# Patient Record
Sex: Male | Born: 1995 | Race: White | Hispanic: No | Marital: Single | Smoking: Never smoker
Health system: Southern US, Community
[De-identification: ages and names within clinical notes are randomized; demographics above are authoritative.]

## PROBLEM LIST (undated history)

## (undated) DIAGNOSIS — F909 Attention-deficit hyperactivity disorder, unspecified type: Secondary | ICD-10-CM

## (undated) DIAGNOSIS — F840 Autistic disorder: Secondary | ICD-10-CM

## (undated) DIAGNOSIS — E119 Type 2 diabetes mellitus without complications: Secondary | ICD-10-CM

## (undated) DIAGNOSIS — F819 Developmental disorder of scholastic skills, unspecified: Secondary | ICD-10-CM

## (undated) DIAGNOSIS — K219 Gastro-esophageal reflux disease without esophagitis: Secondary | ICD-10-CM

## (undated) DIAGNOSIS — F70 Mild intellectual disabilities: Secondary | ICD-10-CM

## (undated) DIAGNOSIS — F419 Anxiety disorder, unspecified: Secondary | ICD-10-CM

## (undated) DIAGNOSIS — F988 Other specified behavioral and emotional disorders with onset usually occurring in childhood and adolescence: Secondary | ICD-10-CM

## (undated) HISTORY — DX: Other specified behavioral and emotional disorders with onset usually occurring in childhood and adolescence: F98.8

## (undated) HISTORY — DX: Developmental disorder of scholastic skills, unspecified: F81.9

---
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
## 2016-01-20 ENCOUNTER — Encounter: Payer: Self-pay | Admitting: Family Medicine

## 2016-01-20 ENCOUNTER — Ambulatory Visit (INDEPENDENT_AMBULATORY_CARE_PROVIDER_SITE_OTHER): Payer: Medicaid Other | Admitting: Family Medicine

## 2016-01-20 VITALS — BP 108/80 | Ht 64.0 in | Wt 213.0 lb

## 2016-01-20 DIAGNOSIS — L259 Unspecified contact dermatitis, unspecified cause: Secondary | ICD-10-CM | POA: Diagnosis not present

## 2016-01-20 DIAGNOSIS — F909 Attention-deficit hyperactivity disorder, unspecified type: Secondary | ICD-10-CM | POA: Diagnosis not present

## 2016-01-20 DIAGNOSIS — F988 Other specified behavioral and emotional disorders with onset usually occurring in childhood and adolescence: Secondary | ICD-10-CM

## 2016-01-20 MED ORDER — METHYLPHENIDATE HCL ER (CD) 50 MG PO CPCR: 50.0000 mg | ORAL_CAPSULE | ORAL | Status: DC

## 2016-01-20 MED ORDER — TRIAMCINOLONE ACETONIDE 0.1 % EX CREA: 1.0000 "application " | TOPICAL_CREAM | Freq: Two times a day (BID) | CUTANEOUS | Status: DC | PRN

## 2016-01-20 NOTE — Progress Notes (Signed)
   Subjective:    Patient ID: Elijah [name], male    DOB: 08/14/1995, 20 y.o.   MRN: [id number]  HPI Patient was seen today for ADD checkup. -weight, vital signs reviewed.  The following items were covered. -Compliance with medication: yes  -Problems with completing tasks: none  - Eating patterns : good  -sleeping: good  -Additional issues or questions: none   Review of Systems  Constitutional: Negative for activity change, appetite change and fatigue.  Gastrointestinal: Negative for abdominal pain.  Neurological: Negative for headaches.  Psychiatric/Behavioral: Negative for behavioral problems.       Objective:   Physical Exam  Constitutional: He appears well-developed and well-nourished. No distress.  HENT:  Head: Normocephalic.  Cardiovascular: Normal rate, regular rhythm and normal heart sounds.   No murmur heard. Pulmonary/Chest: Effort normal and breath sounds normal.  Neurological: He is alert.  Skin: Skin is warm and dry.  Psychiatric: He has a normal mood and affect. His behavior is normal.     Patient with contact dermatitis lower legs steroid cream recommended    patient will follow-up regarding ADD when he gets 41 and of his medicine does not use it all the times of therefore 3 prescriptions will last several months Assessment & Plan:  Patient continues have significant ADD issues into adulthood. He is with his best he can he uses medications on days in which he is working outside helping his family. Patient does have some cognitive difficulties which make it difficult for him to get a job currently patient is trying do the best he can he is driving well though.

## 2016-01-20 NOTE — Patient Instructions (Signed)

## 2016-03-25 ENCOUNTER — Telehealth: Payer: Self-pay | Admitting: Family Medicine

## 2016-03-25 NOTE — Telephone Encounter (Signed)
Tried to call no answer ( We received prior Auth for medication, prior auth submitted on 03/24/16 and awaiting approval)

## 2016-03-25 NOTE — Telephone Encounter (Signed)
Patient's pharmacy said they send a prior authorization for methylphenidate (METADATE CD) 50 MG CR capsule.  He is requesting for this to be completed today because he will be out of the medication this weekend.  [facility]

## 2016-03-31 ENCOUNTER — Telehealth: Payer: Self-pay | Admitting: Family Medicine

## 2016-03-31 NOTE — Telephone Encounter (Signed)
I would recommend Concerta 36 mg one every single day #30 hopefully Metadate will come back in stock then he can go back to his usual medicine

## 2016-03-31 NOTE — Telephone Encounter (Signed)
Pt is needing a different medication for his adhd due to the state being out of the metadate. Pt would be ok with being prescribed concerta or something close to what he is on. He is out of medication and is needing something. Please advise.

## 2016-04-01 ENCOUNTER — Other Ambulatory Visit: Payer: Self-pay | Admitting: *Deleted

## 2016-04-01 MED ORDER — METHYLPHENIDATE HCL ER (OSM) 36 MG PO TBCR: 36.0000 mg | EXTENDED_RELEASE_TABLET | Freq: Every day | ORAL | 0 refills | Status: DC

## 2016-04-01 NOTE — Telephone Encounter (Signed)
Pt.notified

## 2016-04-19 ENCOUNTER — Encounter: Payer: Medicaid Other | Admitting: Family Medicine

## 2016-04-20 ENCOUNTER — Ambulatory Visit (INDEPENDENT_AMBULATORY_CARE_PROVIDER_SITE_OTHER): Payer: Medicaid Other | Admitting: Family Medicine

## 2016-04-20 ENCOUNTER — Encounter: Payer: Self-pay | Admitting: Family Medicine

## 2016-04-20 VITALS — BP 126/84 | Ht 64.0 in | Wt 216.4 lb

## 2016-04-20 DIAGNOSIS — F909 Attention-deficit hyperactivity disorder, unspecified type: Secondary | ICD-10-CM

## 2016-04-20 DIAGNOSIS — F988 Other specified behavioral and emotional disorders with onset usually occurring in childhood and adolescence: Secondary | ICD-10-CM

## 2016-04-20 MED ORDER — RITALIN LA 40 MG PO CP24: 40.0000 mg | ORAL_CAPSULE | ORAL | 0 refills | Status: DC

## 2016-04-20 MED ORDER — METHYLPHENIDATE HCL 10 MG PO TABS: ORAL_TABLET | ORAL | 0 refills | Status: DC

## 2016-04-20 NOTE — Progress Notes (Signed)
   Subjective:    Patient ID: Elijah [name], male    DOB: 05/11/1996, 20 y.o.   MRN: [id number]  HPI Patient was seen today for ADD checkup. -weight, vital signs reviewed.  The following items were covered. -Compliance with medication : Takes daily.  -Problems with completing homework, paying attention/taking good notes in school: Patient has concerns concentrating on Concerta.   -grades: N/A  - Eating patterns : Patient states he eats well.   -sleeping: Patient states he sleeps well.  -Additional issues or questions: States no other concerns this visit.   Review of Systems  Constitutional: Negative for activity change, appetite change and fatigue.  Gastrointestinal: Negative for abdominal pain.  Neurological: Negative for headaches.  Psychiatric/Behavioral: Negative for behavioral problems.       Objective:   Physical Exam  Constitutional: He appears well-developed and well-nourished. No distress.  HENT:  Head: Normocephalic.  Cardiovascular: Normal rate, regular rhythm and normal heart sounds.   No murmur heard. Pulmonary/Chest: Effort normal and breath sounds normal.  Neurological: He is alert.  Skin: Skin is warm and dry.  Psychiatric: He has a normal mood and affect. His behavior is normal.          Assessment & Plan:  ADHD-this does impact his attention in his focus especially at work. He states he did better on Ritalin. He would like to have this prescribed. Therefore we will go with that. He needs to turn in his other dosage of his medication.  As for his 10 mg tablet he takes later in the afternoon only on days he is working should he uses this.  3 prescriptions of the long-acting was given 1 prescription of the short acting was given  Patient was encouraged to watch diet exercise and lose weight

## 2016-04-20 NOTE — Patient Instructions (Signed)
Please use the long-acting Ritalin LA one each morning for ADD. You have 3 prescriptions of this this will last you 90 days.  When necessary you may take one of the 10 mg short-acting pills at 3 PM to help with focus. You may not need this particular medicine every day. I have given you one prescription of this one you may need additional before your next visit if so please call us.  Please follow-up in 3 months time. Follow-up sooner if any problems or if you do not feel the medication is helping.

## 2016-05-11 ENCOUNTER — Ambulatory Visit (INDEPENDENT_AMBULATORY_CARE_PROVIDER_SITE_OTHER): Payer: Medicaid Other | Admitting: Family Medicine

## 2016-05-11 ENCOUNTER — Encounter: Payer: Self-pay | Admitting: Family Medicine

## 2016-05-11 VITALS — BP 122/80 | Temp 98.4°F | Ht 64.0 in | Wt 221.0 lb

## 2016-05-11 DIAGNOSIS — H6503 Acute serous otitis media, bilateral: Secondary | ICD-10-CM | POA: Diagnosis not present

## 2016-05-11 MED ORDER — AMOXICILLIN 500 MG PO CAPS: 500.0000 mg | ORAL_CAPSULE | Freq: Three times a day (TID) | ORAL | 0 refills | Status: DC

## 2016-05-11 NOTE — Progress Notes (Signed)
   Subjective:    Patient ID: CHRLES SELLEY, male    DOB: Apr 03, 1996, 20 y.o.   MRN: [id number]  Otalgia   There is pain in both ears. This is a new problem. The current episode started in the past 7 days. The problem has been unchanged. There has been no fever. The pain is moderate. Associated symptoms include headaches. He has tried nothing for the symptoms. The treatment provided no relief.   Patient has no other concerns at this time.   Ear pain both  And headache frontal in hanature  Using tylenol prn  No fever   Review of Systems  HENT: Positive for ear pain.   Neurological: Positive for headaches.       Objective:   Physical Exam  Alert vital stable hydration good bilateral otitis media evident. Moderate nasal congestion lungs clear heart regular in rhythm.      Assessment & Plan:  Impression post viral bilateral otitis media plan antibiotics prescribed. Symptom care discussed 1 signs discussed

## 2016-07-08 ENCOUNTER — Ambulatory Visit (INDEPENDENT_AMBULATORY_CARE_PROVIDER_SITE_OTHER): Payer: Medicaid Other | Admitting: Nurse Practitioner

## 2016-07-08 ENCOUNTER — Encounter: Payer: Self-pay | Admitting: Nurse Practitioner

## 2016-07-08 ENCOUNTER — Encounter: Payer: Self-pay | Admitting: Family Medicine

## 2016-07-08 VITALS — BP 120/86 | Temp 98.1°F | Ht 64.0 in | Wt 224.4 lb

## 2016-07-08 DIAGNOSIS — J300 Vasomotor rhinitis: Secondary | ICD-10-CM | POA: Diagnosis not present

## 2016-07-08 DIAGNOSIS — R197 Diarrhea, unspecified: Secondary | ICD-10-CM

## 2016-07-08 MED ORDER — BENZONATATE 100 MG PO CAPS: 100.0000 mg | ORAL_CAPSULE | Freq: Three times a day (TID) | ORAL | 0 refills | Status: DC | PRN

## 2016-07-09 ENCOUNTER — Encounter: Payer: Self-pay | Admitting: Nurse Practitioner

## 2016-07-09 NOTE — Progress Notes (Signed)
Subjective:  Presents for c/o frequent diarrhea that began yesterday afternoon. Abdominal pain slightly better today. No fever. Slight sore throat. Mild headache. Nausea, no vomiting. Spells of coughing. Ear pain. No wheezing. Taking allergy pills and flonase. Taking fluids well. Voiding nl.   Objective:   BP 120/86   Temp 98.1 F (36.7 C) (Oral)   Ht 5\' 4"  (1.626 m)   Wt 224 lb 6 oz (101.8 kg)   BMI 38.51 kg/m  NAD. Alert, oriented. TMs clear effusion. Pharynx clear. Neck supple with mild anterior adenopathy. Lungs clear. Heart RRR. Abdomen soft with active BS; minimal tenderness. No rebound or guarding. No obvious masses.   Assessment: Diarrhea, unspecified type  Acute vasomotor rhinitis  Plan:  Meds ordered this encounter  Medications  . benzonatate (TESSALON) 100 MG capsule    Sig: Take 1 capsule (100 mg total) by mouth 3 (three) times daily as needed for cough.    Dispense:  30 capsule    Refill:  0    Order Specific Question:   Supervising Provider    Answer:   Merlyn AlbertLUKING, WILLIAM S [[id number]]    Continue sinus meds. Discussed measures to help diarrhea. Call back in 72 hours if no improvement, sooner if worse.

## 2016-07-12 ENCOUNTER — Ambulatory Visit: Payer: Medicaid Other | Admitting: Family Medicine

## 2016-07-27 ENCOUNTER — Emergency Department (HOSPITAL_COMMUNITY): Payer: Medicaid Other

## 2016-07-27 ENCOUNTER — Emergency Department (HOSPITAL_COMMUNITY)
Admission: EM | Admit: 2016-07-27 | Discharge: 2016-07-27 | Disposition: A | Discharge status: Home / Self Care | Payer: Medicaid Other | Attending: Emergency Medicine | Admitting: Emergency Medicine

## 2016-07-27 ENCOUNTER — Encounter (HOSPITAL_COMMUNITY): Payer: Self-pay | Admitting: Emergency Medicine

## 2016-07-27 DIAGNOSIS — F909 Attention-deficit hyperactivity disorder, unspecified type: Secondary | ICD-10-CM | POA: Diagnosis not present

## 2016-07-27 DIAGNOSIS — Y990 Civilian activity done for income or pay: Secondary | ICD-10-CM | POA: Insufficient documentation

## 2016-07-27 DIAGNOSIS — Y9389 Activity, other specified: Secondary | ICD-10-CM | POA: Insufficient documentation

## 2016-07-27 DIAGNOSIS — S46912A Strain of unspecified muscle, fascia and tendon at shoulder and upper arm level, left arm, initial encounter: Secondary | ICD-10-CM | POA: Diagnosis not present

## 2016-07-27 DIAGNOSIS — Y9269 Other specified industrial and construction area as the place of occurrence of the external cause: Secondary | ICD-10-CM | POA: Insufficient documentation

## 2016-07-27 DIAGNOSIS — X500XXA Overexertion from strenuous movement or load, initial encounter: Secondary | ICD-10-CM | POA: Insufficient documentation

## 2016-07-27 DIAGNOSIS — Z79899 Other long term (current) drug therapy: Secondary | ICD-10-CM | POA: Diagnosis not present

## 2016-07-27 DIAGNOSIS — S4992XA Unspecified injury of left shoulder and upper arm, initial encounter: Secondary | ICD-10-CM | POA: Diagnosis present

## 2016-07-27 MED ORDER — CYCLOBENZAPRINE HCL 10 MG PO TABS: 10.0000 mg | ORAL_TABLET | Freq: Two times a day (BID) | ORAL | 0 refills | Status: DC | PRN

## 2016-07-27 MED ORDER — IBUPROFEN 400 MG PO TABS: 400.0000 mg | ORAL_TABLET | Freq: Four times a day (QID) | ORAL | 0 refills | Status: DC | PRN

## 2016-07-27 NOTE — ED Triage Notes (Signed)
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 PT states some relief from muscle ache cream applied at home recently.

## 2016-07-27 NOTE — ED Notes (Signed)
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 Pain eases with rest and muscle creams. Denies any fall or injury to area.

## 2016-07-27 NOTE — ED Provider Notes (Signed)
[facility] Provider Note   CSN: [id number] Arrival date & time: 07/27/16  1132     History   Chief Complaint Chief Complaint  Patient presents with  . Arm Pain    HPI Elijah [name] is a 21 y.o. male.  HPI   21 year old male presents with complaint of left arm pain. Patient report recurrent seen to his left shoulder ongoing for more than a week. He described pain as a burning sharp shooting pain down his arm worsening with bending of his elbow and lifting his arm. As is worse, pain is 10 out of 10, pain is currently 5 out of 10. Some improvement with using BenGay muscle cream. He denies any associated fever, neck pain, chest pain, numbness, or dropping objects. He is right-hand dominant. He has been working at a new job doing some heavy lifting for the past 3 weeks. He denies any specific injury.  Past Medical History:  Diagnosis Date  . ADD (attention deficit disorder)   . ADHD (attention deficit hyperactivity disorder)   . Learning disability     Patient Active Problem List   Diagnosis Date Noted  . Learning disability 12/21/2014  . ADD (attention deficit disorder) 11/24/2012  . Morbid obesity (HCC) 11/24/2012    History reviewed. No pertinent surgical history.     Home Medications    Prior to Admission medications   Medication Sig Start Date End Date Taking? Authorizing Provider  benzonatate (TESSALON) 100 MG capsule Take 1 capsule (100 mg total) by mouth 3 (three) times daily as needed for cough. 07/08/16   Campbell Richesarolyn C Hoskins, NP  fluticasone (FLONASE) 50 MCG/ACT nasal spray Place 2 sprays into both nostrils daily. Prn head congestion 03/16/15   Campbell Richesarolyn C Hoskins, NP  loratadine (CLARITIN) 10 MG tablet Take 1 tablet (10 mg total) by mouth daily. 12/01/14   Babs SciaraScott A Luking, MD  methylphenidate (RITALIN) 10 MG tablet 1 q3pm when needed for ADD 04/20/16   Babs SciaraScott A Luking, MD  RITALIN LA 40 MG 24 hr capsule Take 1 capsule (40 mg total) by mouth every morning.  04/20/16   Babs SciaraScott A Luking, MD    Family History History reviewed. No pertinent family history.  Social History Social History  Substance Use Topics  . Smoking status: Never Smoker  . Smokeless tobacco: Never Used  . Alcohol use No     Allergies   Patient has no known allergies.   Review of Systems Review of Systems  Constitutional: Negative for fever.  Respiratory: Negative for chest tightness and shortness of breath.   Cardiovascular: Negative for chest pain.  Musculoskeletal: Positive for arthralgias.  Skin: Negative for rash and wound.     Physical Exam Updated Vital Signs BP 131/81 (BP Location: Right Arm)   Pulse 101   Temp 98.1 F (36.7 C) (Oral)   Resp 18   Ht 5\' 4"  (1.626 m)   Wt 101.6 kg   SpO2 97%   BMI 38.45 kg/m   Physical Exam  Constitutional: He appears well-developed and well-nourished. No distress.  HENT:  Head: Atraumatic.  Eyes: Conjunctivae are normal.  Neck: Neck supple.  Musculoskeletal: He exhibits tenderness (Left shoulder: Tenderness to lateral deltoid at the bicipital groove of the bicep on palpation. Decreased shoulder flexion and rotation secondary to pain but no crepitus or deformity. No overlying skin changes.).  No cervical midline spine tenderness crepitus or step-off. Left elbow with normal flexion and extension, radial pulse 2+, normal grip strength  Neurological: He is  alert.  Skin: No rash noted.  Psychiatric: He has a normal mood and affect.  Nursing note and vitals reviewed.    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 History of morbid obesity. EXAM: LEFT SHOULDER - 2+ VIEW COMPARISON:  Chest x-ray which included the right  shoulder dated Dec 16, 2007 FINDINGS: The bones are subjectively adequately mineralized. There is no acute fracture nor dislocation. The joint spaces are well maintained. There is no lytic or blastic bony lesion. The observed portions of the left clavicle and upper left ribs are normal. IMPRESSION: There is no acute or significant chronic bony abnormality of the left shoulder. Electronically Signed   By: David  [place] M.D.   On: 07/27/2016 12:26    Procedures Procedures (including critical care time)  Medications Ordered in ED Medications - No data to display   Initial Impression / Assessment and Plan / ED Course  I have reviewed the triage vital signs and the nursing notes.  Pertinent labs & imaging results that were available during my care of the patient were reviewed by me and considered in my medical decision making (see chart for details).  Clinical Course     BP 131/81 (BP Location: Right Arm)   Pulse 101   Temp 98.1 F (36.7 C) (Oral)   Resp 18   Ht 5\' 4"  (1.626 m)   Wt 101.6 kg   SpO2 97%   BMI 38.45 kg/m    Final Clinical Impressions(s) / ED Diagnoses   Final diagnoses:  Left shoulder strain, initial encounter    New Prescriptions New Prescriptions   CYCLOBENZAPRINE (FLEXERIL) 10 MG TABLET    Take 1 tablet (10 mg total) by mouth 2 (two) times daily as needed for muscle spasms.   IBUPROFEN (ADVIL,MOTRIN) 400 MG TABLET    Take 1 tablet (400 mg total) by mouth every 6 (six) hours as needed.   12:10 PM L shoulder pain from heavy lifting.  Xray to r/o fx.  12:34 PM Xray neg.  Reassurance given.  RICE therapy discussed.  Ortho referral as needed.    Fayrene Helper, PA-C 07/27/16 1234    Donnetta Hutching, MD 07/30/16 [phone]

## 2016-08-02 ENCOUNTER — Encounter (HOSPITAL_COMMUNITY): Payer: Self-pay

## 2016-08-02 ENCOUNTER — Emergency Department (HOSPITAL_COMMUNITY)
Admission: EM | Admit: 2016-08-02 | Discharge: 2016-08-02 | Disposition: A | Discharge status: Home / Self Care | Payer: Medicaid Other | Attending: Emergency Medicine | Admitting: Emergency Medicine

## 2016-08-02 DIAGNOSIS — S4992XD Unspecified injury of left shoulder and upper arm, subsequent encounter: Secondary | ICD-10-CM | POA: Diagnosis present

## 2016-08-02 DIAGNOSIS — F909 Attention-deficit hyperactivity disorder, unspecified type: Secondary | ICD-10-CM | POA: Diagnosis not present

## 2016-08-02 DIAGNOSIS — S46912D Strain of unspecified muscle, fascia and tendon at shoulder and upper arm level, left arm, subsequent encounter: Secondary | ICD-10-CM

## 2016-08-02 DIAGNOSIS — S46812D Strain of other muscles, fascia and tendons at shoulder and upper arm level, left arm, subsequent encounter: Secondary | ICD-10-CM | POA: Diagnosis not present

## 2016-08-02 DIAGNOSIS — X500XXD Overexertion from strenuous movement or load, subsequent encounter: Secondary | ICD-10-CM | POA: Insufficient documentation

## 2016-08-02 MED ORDER — IBUPROFEN 600 MG PO TABS: 600.0000 mg | ORAL_TABLET | Freq: Four times a day (QID) | ORAL | 0 refills | Status: DC | PRN

## 2016-08-02 MED ORDER — METHOCARBAMOL 500 MG PO TABS: 500.0000 mg | ORAL_TABLET | Freq: Three times a day (TID) | ORAL | 0 refills | Status: DC

## 2016-08-02 NOTE — ED Provider Notes (Signed)
[facility] Provider Note   CSN: [id number] Arrival date & time: 08/02/16  2000     History   Chief Complaint Chief Complaint  Patient presents with  . Shoulder Pain    HPI EAN GETTEL is a 21 y.o. male.  HPI   Elijah [name] is a 21 y.o. male who presents to the Emergency Department complaining of Persistent left shoulder pain. He was seen here on 07/27/2016 for the same. He states the pain became worse 3 days ago after a fall against a wall. He reports pain with raising his arm and with rotation. He describes it as a burning sharp pain. He also states that he does heavy lifting and reaching at his job which he believes may have also contributed to his pain.  He was treated with Flexeril and ibuprofen previously, states the Flexeril makes him too sleepy and he is ran out of ibuprofen. He comes here this evening requesting a work note. He denies headaches, fever, swelling, numbness or weakness of the upper extremity.    Past Medical History:  Diagnosis Date  . ADD (attention deficit disorder)   . ADHD (attention deficit hyperactivity disorder)   . Learning disability     Patient Active Problem List   Diagnosis Date Noted  . Learning disability 12/21/2014  . ADD (attention deficit disorder) 11/24/2012  . Morbid obesity (HCC) 11/24/2012    History reviewed. No pertinent surgical history.     Home Medications    Prior to Admission medications   Medication Sig Start Date End Date Taking? Authorizing Provider  benzonatate (TESSALON) 100 MG capsule Take 1 capsule (100 mg total) by mouth 3 (three) times daily as needed for cough. 07/08/16   Campbell Riches, NP  cyclobenzaprine (FLEXERIL) 10 MG tablet Take 1 tablet (10 mg total) by mouth 2 (two) times daily as needed for muscle spasms. 07/27/16   Fayrene Helper, PA-C  fluticasone (FLONASE) 50 MCG/ACT nasal spray Place 2 sprays into both nostrils daily. Prn head congestion 03/16/15   Campbell Riches, NP    ibuprofen (ADVIL,MOTRIN) 400 MG tablet Take 1 tablet (400 mg total) by mouth every 6 (six) hours as needed. 07/27/16   Fayrene Helper, PA-C  loratadine (CLARITIN) 10 MG tablet Take 1 tablet (10 mg total) by mouth daily. 12/01/14   Babs Sciara, MD  methylphenidate (RITALIN) 10 MG tablet 1 q3pm when needed for ADD 04/20/16   Babs Sciara, MD  RITALIN LA 40 MG 24 hr capsule Take 1 capsule (40 mg total) by mouth every morning. 04/20/16   Babs Sciara, MD    Family History No family history on file.  Social History Social History  Substance Use Topics  . Smoking status: Never Smoker  . Smokeless tobacco: Never Used  . Alcohol use No     Allergies   Patient has no known allergies.   Review of Systems Review of Systems  Constitutional: Negative for chills and fever.  Respiratory: Negative for shortness of breath.   Cardiovascular: Negative for chest pain.  Gastrointestinal: Negative for abdominal pain, nausea and vomiting.  Genitourinary: Negative for difficulty urinating and dysuria.  Musculoskeletal: Positive for arthralgias (Left shoulder pain). Negative for back pain, joint swelling, neck pain and neck stiffness.  Skin: Negative for color change and rash.  Neurological: Negative for dizziness, weakness and numbness.  All other systems reviewed and are negative.    Physical Exam Updated Vital Signs BP 131/73 (BP Location: Right Arm)  Pulse 93   Temp 98.3 F (36.8 C) (Oral)   Resp 20   Ht 5\' 4"  (1.626 m)   Wt 99.3 kg   SpO2 97%   BMI 37.59 kg/m   Physical Exam  Constitutional: He is oriented to person, place, and time. He appears well-developed and well-nourished. No distress.  HENT:  Head: Normocephalic and atraumatic.  Neck: Normal range of motion. Neck supple. No thyromegaly present.  Cardiovascular: Normal rate, regular rhythm and intact distal pulses.   Pulmonary/Chest: Effort normal and breath sounds normal. No respiratory distress. He exhibits no tenderness.   Musculoskeletal: Normal range of motion. He exhibits tenderness. He exhibits no edema or deformity.  ttp of the posterior left shoulder and lateral deltoid muscle.  Pain reproduced with abduction of the left arm and rotation of the shoulder.  Radial pulse is brisk, distal sensation intact, CR< 2 sec. Grip strength is strong and symmetrical.   No edema , erythema or step-off deformity of the joint.   Lymphadenopathy:    He has no cervical adenopathy.  Neurological: He is alert and oriented to person, place, and time. He has normal strength. No sensory deficit. He exhibits normal muscle tone. Coordination normal.  Skin: Skin is warm and dry.  Nursing note and vitals reviewed.    ED Treatments / Results  Labs (all labs ordered are listed, but only abnormal results are displayed) Labs Reviewed - No data to display  EKG  EKG Interpretation None       Radiology No results found.  Procedures Procedures (including critical care time)  Medications Ordered in ED Medications - No data to display   Initial Impression / Assessment and Plan / ED Course  I have reviewed the triage vital signs and the nursing notes.  Pertinent labs & imaging results that were available during my care of the patient were reviewed by me and considered in my medical decision making (see chart for details).  Clinical Course     Patient here 6 days ago for same symptoms. Reports worsening pain after a blunt injury in which he fell against a wall. Patient has full range of motion on exam. Remains neurovascularly intact. Given reported mechanism of injury, I doubt fx.     X-ray from previous visit was reviewed by me. Exam findings consistent with sprain. Patient requesting a work note. He states he has appointment with his PCP in 3 days. He agrees to ice, and will refill ibuprofen and Robaxin  Final Clinical Impressions(s) / ED Diagnoses   Final diagnoses:  Strain of left shoulder, subsequent encounter     New Prescriptions New Prescriptions   No medications on file     Rosey Bathammy Banks Chaikin, PA-C 08/02/16 2054    Vanetta MuldersScott Zackowski, MD 08/02/16 2129

## 2016-08-02 NOTE — Discharge Instructions (Signed)
Apply ice packs on/off to your shoulder.  Stop the flexeril.  Follow-up with Dr. Gerda DissLuking this week

## 2016-08-02 NOTE — ED Triage Notes (Signed)
I have been seen for left shoulder pain and they said I had a muscle strain.  The pain has became worse since I slipped and fell up against a wall hurting it again.

## 2016-08-05 ENCOUNTER — Encounter: Payer: Self-pay | Admitting: Nurse Practitioner

## 2016-08-05 ENCOUNTER — Ambulatory Visit (INDEPENDENT_AMBULATORY_CARE_PROVIDER_SITE_OTHER): Payer: Medicaid Other | Admitting: Nurse Practitioner

## 2016-08-05 VITALS — BP 118/82 | Ht 64.0 in | Wt 218.6 lb

## 2016-08-05 DIAGNOSIS — F988 Other specified behavioral and emotional disorders with onset usually occurring in childhood and adolescence: Secondary | ICD-10-CM | POA: Diagnosis not present

## 2016-08-05 DIAGNOSIS — S46912D Strain of unspecified muscle, fascia and tendon at shoulder and upper arm level, left arm, subsequent encounter: Secondary | ICD-10-CM

## 2016-08-05 MED ORDER — RITALIN LA 40 MG PO CP24: 40.0000 mg | ORAL_CAPSULE | ORAL | 0 refills | Status: DC

## 2016-08-05 MED ORDER — FLUTICASONE PROPIONATE 50 MCG/ACT NA SUSP: 2.0000 | Freq: Every day | NASAL | 5 refills | Status: DC

## 2016-08-05 NOTE — Progress Notes (Signed)
Subjective: Presents for recheck on ADD/ADHD today.  Compliant with medication: yes; does not take second dose  Difficulty completing tasks or focusing: none  Side effects: none  Appetite: normal   Sleep: 3rd shift; trouble sleeping related to this  Additional issues or questions: also wants to recheck left shoulder; had a shoulder strain; seen at ED on 1/3 and 1/9. Has been off work for a few days; getting better  Objective: BP 118/82   Ht 5\' 4"  (1.626 m)   Wt 218 lb 9.6 oz (99.2 kg)   BMI 37.52 kg/m  NAD. Alert, oriented. Lungs clear. Heart RRR. Normal ROM of left shoulder with minimal tenderness. Hand and arm strength 5 + bilat.   Assessment:  Problem List Items Addressed This Visit      Other   ADD (attention deficit disorder) - Primary    Other Visit Diagnoses    Strain of left shoulder, subsequent encounter           Plan:  Meds ordered this encounter  Medications  . fluticasone (FLONASE) 50 MCG/ACT nasal spray    Sig: Place 2 sprays into both nostrils daily. Prn head congestion    Dispense:  16 g    Refill:  5    Order Specific Question:   Supervising Provider    Answer:   Merlyn AlbertLUKING, WILLIAM S [[id number]]  . DISCONTD: RITALIN LA 40 MG 24 hr capsule    Sig: Take 1 capsule (40 mg total) by mouth every morning.    Dispense:  30 capsule    Refill:  0    Dispense as written-brand name only per Medicaid guidelines    Order Specific Question:   Supervising Provider    Answer:   Merlyn AlbertLUKING, WILLIAM S [[id number]]  . DISCONTD: RITALIN LA 40 MG 24 hr capsule    Sig: Take 1 capsule (40 mg total) by mouth every morning. May fill 30 days from 08/05/16    Dispense:  30 capsule    Refill:  0    Dispense as written-brand name only per Medicaid guidelines    Order Specific Question:   Supervising Provider    Answer:   Merlyn AlbertLUKING, WILLIAM S [[id number]]  . DISCONTD: RITALIN LA 40 MG 24 hr capsule    Sig: Take 1 capsule (40 mg total) by mouth every morning.    Dispense:  30 capsule    Refill:  0    Dispense as written-brand name only per Medicaid guidelines; may refill 60 days from 08/05/16    Order Specific Question:   Supervising Provider    Answer:   Merlyn AlbertLUKING, WILLIAM S [[id number]]  . RITALIN LA 40 MG 24 hr capsule    Sig: Take 1 capsule (40 mg total) by mouth every morning.    Dispense:  30 capsule    Refill:  0    Dispense as written-brand name only per Medicaid guidelines; may refill 90 days from 08/05/16   Given 3 Rx by NP and fourth by MD. Given copy of shoulder exercises. Return to work on Sunday as planned. Call back 1/15 if unable to work. Return in about 4 months (around 12/03/2016).

## 2016-08-06 ENCOUNTER — Encounter: Payer: Self-pay | Admitting: Nurse Practitioner

## 2016-08-08 ENCOUNTER — Encounter: Payer: Self-pay | Admitting: Nurse Practitioner

## 2016-08-08 ENCOUNTER — Other Ambulatory Visit: Payer: Self-pay | Admitting: Nurse Practitioner

## 2016-08-08 DIAGNOSIS — S4992XA Unspecified injury of left shoulder and upper arm, initial encounter: Secondary | ICD-10-CM

## 2016-08-16 ENCOUNTER — Ambulatory Visit (INDEPENDENT_AMBULATORY_CARE_PROVIDER_SITE_OTHER): Payer: Medicaid Other | Admitting: Family Medicine

## 2016-08-16 ENCOUNTER — Encounter: Payer: Self-pay | Admitting: Family Medicine

## 2016-08-16 VITALS — Temp 98.9°F | Ht 64.0 in | Wt 215.0 lb

## 2016-08-16 DIAGNOSIS — R6889 Other general symptoms and signs: Secondary | ICD-10-CM

## 2016-08-16 DIAGNOSIS — J029 Acute pharyngitis, unspecified: Secondary | ICD-10-CM | POA: Diagnosis not present

## 2016-08-16 MED ORDER — AZITHROMYCIN 250 MG PO TABS: ORAL_TABLET | ORAL | 0 refills | Status: DC

## 2016-08-16 NOTE — Patient Instructions (Addendum)
Clinically has strep throat/ has tonsillitis Will treat with Zpack No work for Tuesday thru Thursday Hopefully can return to work on Friday night  If you need a note for Friday please call  Call us if worse

## 2016-08-16 NOTE — Progress Notes (Signed)
   Subjective:    Patient ID: Elijah [name], male    DOB: 11/14/1995, 21 y.o.   MRN: [id number]  Cough  This is a new problem. The current episode started in the past 7 days. Associated symptoms include ear pain, a fever, nasal congestion, a sore throat and wheezing.   This patient does relate some head congestion and drainage but he also been no wheezing was heard on physical exam. Patient does relate a progressive sore throat hurts to swallow. His illness started on Friday and progressively got worse over the weekend.relates a severe sore throat is been present on past few days initially he stated that he was wheezing   Review of Systems  Constitutional: Positive for fever.  HENT: Positive for ear pain and sore throat.   Respiratory: Positive for cough and wheezing.    makes good eye contact     Objective: makes good eye contact   Physical Exam    patient not toxic lungs are clear hearts regular patient with exudative pharyngitis worse on the left than the right airway not compromised although he does have some symptoms of the flu is exudative pharyngitis and sore throat are impressive I would recommend go ahead and covering this with an antibiotic to treat for the possibility of strep patient does need to follow-up if progressive troubles or if worse    Assessment & Plan:  See above

## 2016-08-23 ENCOUNTER — Encounter: Payer: Self-pay | Admitting: Family Medicine

## 2016-10-06 ENCOUNTER — Telehealth: Payer: Self-pay | Admitting: Nurse Practitioner

## 2016-10-06 ENCOUNTER — Encounter (HOSPITAL_COMMUNITY): Payer: Self-pay

## 2016-10-06 ENCOUNTER — Emergency Department (HOSPITAL_COMMUNITY)
Admission: EM | Admit: 2016-10-06 | Discharge: 2016-10-07 | Disposition: A | Discharge status: Home / Self Care | Payer: Medicaid Other | Attending: Emergency Medicine | Admitting: Emergency Medicine

## 2016-10-06 DIAGNOSIS — F918 Other conduct disorders: Secondary | ICD-10-CM | POA: Insufficient documentation

## 2016-10-06 DIAGNOSIS — F909 Attention-deficit hyperactivity disorder, unspecified type: Secondary | ICD-10-CM | POA: Diagnosis not present

## 2016-10-06 DIAGNOSIS — R4689 Other symptoms and signs involving appearance and behavior: Secondary | ICD-10-CM

## 2016-10-06 DIAGNOSIS — R440 Auditory hallucinations: Secondary | ICD-10-CM | POA: Diagnosis not present

## 2016-10-06 LAB — URINALYSIS, ROUTINE W REFLEX MICROSCOPIC
Bilirubin Urine: NEGATIVE
GLUCOSE, UA: NEGATIVE mg/dL
HGB URINE DIPSTICK: NEGATIVE
Ketones, ur: NEGATIVE mg/dL
Leukocytes, UA: NEGATIVE
Nitrite: NEGATIVE
PROTEIN: NEGATIVE mg/dL
Specific Gravity, Urine: 1.019 (ref 1.005–1.030)
pH: 6 (ref 5.0–8.0)

## 2016-10-06 LAB — CBC WITH DIFFERENTIAL/PLATELET
Basophils Absolute: 0 10*3/uL (ref 0.0–0.1)
Basophils Relative: 0 %
EOS ABS: 0.3 10*3/uL (ref 0.0–0.7)
EOS PCT: 3 %
HCT: 44.6 % (ref 39.0–52.0)
Hemoglobin: 14.6 g/dL (ref 13.0–17.0)
LYMPHS ABS: 3.9 10*3/uL (ref 0.7–4.0)
Lymphocytes Relative: 39 %
MCH: 27.8 pg (ref 26.0–34.0)
MCHC: 32.7 g/dL (ref 30.0–36.0)
MCV: 84.8 fL (ref 78.0–100.0)
MONO ABS: 1.1 10*3/uL — AB (ref 0.1–1.0)
Monocytes Relative: 11 %
NEUTROS PCT: 47 %
Neutro Abs: 4.6 10*3/uL (ref 1.7–7.7)
PLATELETS: 274 10*3/uL (ref 150–400)
RBC: 5.26 MIL/uL (ref 4.22–5.81)
RDW: 13.4 % (ref 11.5–15.5)
WBC: 9.9 10*3/uL (ref 4.0–10.5)

## 2016-10-06 LAB — BASIC METABOLIC PANEL
ANION GAP: 12 (ref 5–15)
BUN: 9 mg/dL (ref 6–20)
CHLORIDE: 101 mmol/L (ref 101–111)
CO2: 26 mmol/L (ref 22–32)
Calcium: 9.9 mg/dL (ref 8.9–10.3)
Creatinine, Ser: 0.63 mg/dL (ref 0.61–1.24)
Glucose, Bld: 88 mg/dL (ref 65–99)
POTASSIUM: 4.2 mmol/L (ref 3.5–5.1)
SODIUM: 139 mmol/L (ref 135–145)

## 2016-10-06 LAB — ACETAMINOPHEN LEVEL: Acetaminophen (Tylenol), Serum: <10 ug/mL — ABNORMAL LOW (ref 10–30)

## 2016-10-06 LAB — ETHANOL

## 2016-10-06 LAB — RAPID URINE DRUG SCREEN, HOSP PERFORMED
Amphetamines: NOT DETECTED
Barbiturates: NOT DETECTED
Benzodiazepines: NOT DETECTED
Cocaine: NOT DETECTED
OPIATES: NOT DETECTED
TETRAHYDROCANNABINOL: NOT DETECTED

## 2016-10-06 LAB — SALICYLATE LEVEL: Salicylate Lvl: <7 mg/dL (ref 2.8–30.0)

## 2016-10-06 MED ORDER — ACETAMINOPHEN 500 MG PO TABS
1000.0000 mg | ORAL_TABLET | Freq: Once | ORAL | Status: AC
  Administered 2016-10-06: 1000 mg via ORAL
  Filled 2016-10-06: qty 2

## 2016-10-06 MED ORDER — IBUPROFEN 200 MG PO TABS: 600.0000 mg | ORAL_TABLET | Freq: Four times a day (QID) | ORAL | Status: DC | PRN

## 2016-10-06 MED ORDER — FLUTICASONE PROPIONATE 50 MCG/ACT NA SUSP
2.0000 | Freq: Every day | NASAL | Status: DC | PRN
  Administered 2016-10-07: 2 via NASAL
  Filled 2016-10-06: qty 16

## 2016-10-06 NOTE — ED Provider Notes (Signed)
Emergency Department Provider Note   I have reviewed the triage vital signs and the nursing notes.   HISTORY  Chief Complaint Psychiatric Evaluation   HPI Elijah [name] is a 21 y.o. male with PMH of autism and ADD presents to the emergency room in for evaluation of increased aggression, auditory hallucinations, and mania symptoms over the last 2 weeks. The patient stopped taking his ritalin as prescribed for unknown reason. Mom states that he recently moved in with his grandparents and started a part-time job. She notes that he has been aggressive toward family members including his sister and cousin. He reports hearing sounds of animals and sometimes voices which is very unusual for him. Mom states he has not been sleeping, spending lots of money, and generally irritable. He has had inpatient stays in psychiatric facilities in the past but nothing recently. Denies SI or HI but has been making statements such as "I'm done with my life."    Past Medical History:  Diagnosis Date  . ADD (attention deficit disorder)   . ADHD (attention deficit hyperactivity disorder)   . Learning disability     Patient Active Problem List   Diagnosis Date Noted  . Aggressive behavior   . Auditory hallucinations   . Learning disability 12/21/2014  . ADD (attention deficit disorder) 11/24/2012  . Morbid obesity (HCC) 11/24/2012    History reviewed. No pertinent surgical history.  Current Outpatient Rx  . Order #: [id number] Class: Normal  . Order #: [id number] Class: Print    Allergies Patient has no known allergies.  No family history on file.  Social History Social History  Substance Use Topics  . Smoking status: Never Smoker  . Smokeless tobacco: Never Used  . Alcohol use No    Review of Systems  Constitutional: No fever/chills Eyes: No visual changes. ENT: No sore throat. Cardiovascular: Denies chest pain. Respiratory: Denies shortness of breath. Gastrointestinal: No  abdominal pain.  No nausea, no vomiting.  No diarrhea.  No constipation. Genitourinary: Negative for dysuria. Musculoskeletal: Negative for back pain. Skin: Negative for rash. Neurological: Negative for headaches, focal weakness or numbness. Psychiatric: Aggression and auditory hallucinations.   10-point ROS otherwise negative.  ____________________________________________   PHYSICAL EXAM:  VITAL SIGNS: ED Triage Vitals [10/06/16 1851]  Enc Vitals Group     BP (!) 143/81     Pulse Rate 94     Resp 20     Temp 97.9 F (36.6 C)     Temp Source Oral     SpO2 96 %     Weight 221 lb 2 oz (100.3 kg)     Height 5\' 6"  (1.676 m)    Constitutional: Alert and oriented. Well appearing and in no acute distress. Eyes: Conjunctivae are normal.  Head: Atraumatic. Nose: No congestion/rhinnorhea. Mouth/Throat: Mucous membranes are moist.  Oropharynx non-erythematous. Neck: No stridor.   Cardiovascular: Normal rate, regular rhythm. Good peripheral circulation. Grossly normal heart sounds.   Respiratory: Normal respiratory effort.  No retractions. Lungs CTAB. Gastrointestinal: Soft and nontender. No distention.  Musculoskeletal: No lower extremity tenderness nor edema. No gross deformities of extremities. Neurologic:  Normal speech and language. No gross focal neurologic deficits are appreciated.  Skin:  Skin is warm, dry and intact. No rash noted. Psychiatric: Mood and affect are normal. Speech and behavior are normal. ____________________________________________   LABS (all labs ordered are listed, but only abnormal results are displayed)  Labs Reviewed  ACETAMINOPHEN LEVEL - Abnormal; Notable for the following:  Result Value   Acetaminophen (Tylenol), Serum <10 (*)    All other components within normal limits  CBC WITH DIFFERENTIAL/PLATELET - Abnormal; Notable for the following:    Monocytes Absolute 1.1 (*)    All other components within normal limits  BASIC METABOLIC PANEL   ETHANOL  SALICYLATE LEVEL  URINALYSIS, ROUTINE W REFLEX MICROSCOPIC  RAPID URINE DRUG SCREEN, HOSP PERFORMED   ____________________________________________   PROCEDURES  Procedure(s) performed:   Procedures  None ____________________________________________   INITIAL IMPRESSION / ASSESSMENT AND PLAN / ED COURSE  Pertinent labs & imaging results that were available during my care of the patient were reviewed by me and considered in my medical decision making (see chart for details).  Patient resents to the emergency room in for evaluation of increased aggression toward family, auditory hallucinations, borderline manic behavior. No diagnoses of bipolar disorder. The patient has been off of his Ritalin for the last 2 weeks when symptoms began. Denies active suicidal or homicidal ideation. He has been making passive statements about being done with his life. Believe this patient would benefit from psychiatric evaluation and possible placement. No IVC at this time. Mother is available by phone and her phone numbers updated in Epic. Plan for medical clearance and psychiatry evaluation.  11:34 PM Labs reviewed. The patient is voluntary and medically clear for evaluation. Will place consult and PRN medication.  ____________________________________________  FINAL CLINICAL IMPRESSION(S) / ED DIAGNOSES  Final diagnoses:  Aggressive behavior  Auditory hallucinations     MEDICATIONS GIVEN DURING THIS VISIT:  Medications  fluticasone (FLONASE) 50 MCG/ACT nasal spray 2 spray (2 sprays Each Nare Given 10/07/16 0130)  ibuprofen (ADVIL,MOTRIN) tablet 600 mg (not administered)  acetaminophen (TYLENOL) tablet 1,000 mg (1,000 mg Oral Given 10/06/16 2253)     NEW OUTPATIENT MEDICATIONS STARTED DURING THIS VISIT:  None   Note:  This document was prepared using Dragon voice recognition software and may include unintentional dictation errors.  Alona Bene, MD Emergency Medicine     Maia Plan, MD 10/07/16 [phone]

## 2016-10-06 NOTE — Telephone Encounter (Signed)
It depends on what level she thinks he is at. If she feels he is a danger to himself or others, he needs to be committed to a mental hospital. The best way to achieve this is admission through ED. If she does not feel that is the case, I recommend going through [facility] in [place] which takes urgent cases. We can provide information if that is the route she wants to go. Usually the only patients that are admitted are suicidal or homicidal.

## 2016-10-06 NOTE — ED Triage Notes (Signed)
Pt brought in by his mother. Pt is autistic and has not been taking his medications in two months. She reports he is hallucinating, has anger problems, punching walls, spending money he doesn't have, and has been lying. He has recently moved out of his mother's house and moved in with his grandmother. Pt agrees with these statements. He is alert and able to communicate and speak in clear complete sentences. Pt denies illegal drugs and denies SI/HI.

## 2016-10-06 NOTE — ED Notes (Signed)
Pt changing into purple scrubs, provided with slip resistant socks. Mother at bedside with pt. Pt requesting ginger ale.

## 2016-10-06 NOTE — Telephone Encounter (Signed)
Patients mother is requesting [facility] to call her back.  She is thinking about having Beryle Beamsrevor committed because of his anger issues.  He has punched a hole in the wall, he lost his job and his lying is getting worse.  She would [facility] to [facility] today to get some guidance.

## 2016-10-06 NOTE — ED Notes (Signed)
Pt cell phone taken home with mom

## 2016-10-06 NOTE — ED Notes (Signed)
Elijah GobbleSamantha Campbell (mom): cell [phone] home [phone]

## 2016-10-07 DIAGNOSIS — R4589 Other symptoms and signs involving emotional state: Secondary | ICD-10-CM

## 2016-10-07 DIAGNOSIS — R440 Auditory hallucinations: Secondary | ICD-10-CM | POA: Insufficient documentation

## 2016-10-07 DIAGNOSIS — Z79899 Other long term (current) drug therapy: Secondary | ICD-10-CM

## 2016-10-07 DIAGNOSIS — R4689 Other symptoms and signs involving appearance and behavior: Secondary | ICD-10-CM | POA: Diagnosis present

## 2016-10-07 NOTE — BH Assessment (Signed)
Assessment completed. Consulted Nira ConnJason Berry, FNP who recommended that pt have an am psych eval for final disposition.

## 2016-10-07 NOTE — ED Notes (Signed)
Mother at bedside visiting with patient.  Pt remains calm and cooperative at this time.

## 2016-10-07 NOTE — ED Notes (Signed)
Family member is coming to get the pt due to medical emergency that mother is dealing with. MD is aware and agree upon

## 2016-10-07 NOTE — Progress Notes (Signed)
Provided outpatient provider options in [place] in pt's d/c summary to assist in linkage to OP services.   Ilean SkillMeghan Lezly Rumpf, MSW, LCSW Clinical Social Work, Disposition  10/07/2016 [phone]

## 2016-10-07 NOTE — Discharge Instructions (Signed)
Options for outpatient behavioral health treatment in [place], [place] [facility], [place] office  Address: [address], [place], [place] [address] Phone: [phone](336) [phone]  [facility] (all ages) Address: [address], [place], [place] [address] Phone: [phone](336) [phone]  [facility] Address: [address], [place], [place] [address] Phone: [phone](336) [phone]  This is not a comprehensive list. You can also contact [facility], Phone#: [phone], for more information on available services in your area

## 2016-10-07 NOTE — Telephone Encounter (Signed)
Mother called and stated she took patient to hospital yesterday and he was admitted to in patient services for mental health.

## 2016-10-07 NOTE — Consult Note (Signed)
Telepsych Consultation   Reason for Consult:   Referring Physician:  EDP Patient Identification: Elijah [name] MRN:  [id number] Principal Diagnosis: Aggressive behavior  Diagnosis:   Patient Active Problem List   Diagnosis Date Noted  . Aggressive behavior [R45.89]   . Auditory hallucinations [R44.0]   . Learning disability [F81.9] 12/21/2014  . ADD (attention deficit disorder) [F98.8] 11/24/2012  . Morbid obesity (HCC) [E66.01] 11/24/2012    Total Time spent with patient: 30 minutes  Subjective:   Elijah [name] is a 21 y.o. male patient admitted with aggressive behavior.  HPI: Per tele assessment note on chart written by Gilberto Better, [facility] Counselor: Elijah [name] is an 21 y.o. male presenting to [facility] voluntarily reporting increasing anger and auditory hallucinations. PT stated "I am here for anger issues, I can't sleep and I'm depressed". Pt reported that in the past he has punched holes in the walls and he is having trouble keeping a job. Pt denies SI and HI at this time. PT is reporting auditory hallucinations and shared that he hears whispers, animal noises and people talking; however he is unable to understand what they are saying. PT reported that he has trouble sleeping at night and shared that he gets approximately 2-3 hours of sleep at night. No previous suicide attempts or self-injurious behaviors reported. No drug or alcohol use reported. No known access to firearms and pt denies having any pending court dates. No physical, sexual or emotional abuse reported.   Diagnosis: Adjustment Disorder   Today during tele psych consult:  Pt was seen and chart reviewed. Elijah [name] is a 21 year old male who presented to the [facility] voluntarily after becoming aggressive at home and punching a wall. Pt denies suicidal/homicidal ideation, denies auditory/visual hallucinations and does not appear to be responding to internal stimuli. Pt was calm and cooperative, alert & oriented  x 3, dressed in paper scrubs, and sitting on the hospital stretcher.  Pt stated he has been depressed and anxious and not sleeping well lately. Pt stated he has crying spells when talking to his mother on the phone. Denny Peon, staff RN at [facility] read a list of what Pt and his mother wanted addressed and it was as follows: anger, punching walls, spending money, anxiety, racing thoughts, and hearing things. Pt stated he hears people talking and animals when he is asleep. Pt states "I get mad and I feel scared and disappointed. I was tasking Ritalin but stopped taking it because I didn't think it was helping me." Pt stated he lied to his mother about going to work and stated he had not been to work in a week. Pt stated he has never had any medication or therapy/psychiatry for his mood and depression. Pt is open and willing to seeing a therapist and possibly getting on medication to help him. UDS negative, BAL negative.  Past Psychiatric History: ADD,   Risk to Self: Suicidal Ideation: No Suicidal Intent: No Is patient at risk for suicide?: No Suicidal Plan?: No Access to Means: No What has been your use of drugs/alcohol within the last 12 months?: No drugs or alcohol use reported.  How many times?: 0 Other Self Harm Risks: PT denies  Triggers for Past Attempts: None known Intentional Self Injurious Behavior: None Risk to Others: Homicidal Ideation: No Thoughts of Harm to Others: No Current Homicidal Intent: No Current Homicidal Plan: No Access to Homicidal Means: No Identified Victim: N/A History of harm to others?: No Assessment of Violence: None  Noted Violent Behavior Description: No violent behaviors observed.  Does patient have access to weapons?: No Criminal Charges Pending?: No Does patient have a court date: No Prior Inpatient Therapy: Prior Inpatient Therapy: No Prior Outpatient Therapy: Prior Outpatient Therapy: Yes Prior Therapy Dates:  (Pt unable to recall ) Prior Therapy  Facilty/Provider(s):  (Pt unable to recall ) Reason for Treatment:  (Pt unable to recall ) Does patient have an ACCT team?: No Does patient have Intensive In-House Services?  : No Does patient have [facility] services? : No Does patient have [facility] services?: No  Past Medical History:  Past Medical History:  Diagnosis Date  . ADD (attention deficit disorder)   . ADHD (attention deficit hyperactivity disorder)   . Learning disability    History reviewed. No pertinent surgical history. Family History: No family history on file. Family Psychiatric  History: Unknown Social History:  History  Alcohol Use No     History  Drug Use No    Social History   Social History  . Marital status: Single    Spouse name: N/A  . Number of children: N/A  . Years of education: N/A   Social History Main Topics  . Smoking status: Never Smoker  . Smokeless tobacco: Never Used  . Alcohol use No  . Drug use: No  . Sexual activity: Not Asked   Other Topics Concern  . None   Social History Narrative  . None   Additional Social History:    Allergies:  No Known Allergies  Labs:  Results for orders placed or performed during the hospital encounter of 10/06/16 (from the past 48 hour(s))  Basic metabolic panel     Status: None   Collection Time: 10/06/16 10:31 PM  Result Value Ref Range   Sodium 139 135 - 145 mmol/L   Potassium 4.2 3.5 - 5.1 mmol/L   Chloride 101 101 - 111 mmol/L   CO2 26 22 - 32 mmol/L   Glucose, Bld 88 65 - 99 mg/dL   BUN 9 6 - 20 mg/dL   Creatinine, Ser 0.63 0.61 - 1.24 mg/dL   Calcium 9.9 8.9 - 10.3 mg/dL   GFR calc non Af Amer >60 >60 mL/min   GFR calc Af Amer >60 >60 mL/min    Comment: (NOTE) The eGFR has been calculated using the CKD EPI equation. This calculation has not been validated in all clinical situations. eGFR's persistently <60 mL/min signify possible Chronic Kidney Disease.    Anion gap 12 5 - 15  Ethanol     Status: None   Collection Time: 10/06/16  10:31 PM  Result Value Ref Range   Alcohol, Ethyl (B) <5 <5 mg/dL    Comment:        LOWEST DETECTABLE LIMIT FOR SERUM ALCOHOL IS 5 mg/dL FOR MEDICAL PURPOSES ONLY   Acetaminophen level     Status: Abnormal   Collection Time: 10/06/16 10:31 PM  Result Value Ref Range   Acetaminophen (Tylenol), Serum <10 (L) 10 - 30 ug/mL    Comment:        THERAPEUTIC CONCENTRATIONS VARY SIGNIFICANTLY. A RANGE OF 10-30 ug/mL MAY BE AN EFFECTIVE CONCENTRATION FOR MANY PATIENTS. HOWEVER, SOME ARE BEST TREATED AT CONCENTRATIONS OUTSIDE THIS RANGE. ACETAMINOPHEN CONCENTRATIONS >150 ug/mL AT 4 HOURS AFTER INGESTION AND >50 ug/mL AT 12 HOURS AFTER INGESTION ARE OFTEN ASSOCIATED WITH TOXIC REACTIONS.   Salicylate level     Status: None   Collection Time: 10/06/16 10:31 PM  Result Value Ref Range  Salicylate Lvl <7.0 2.8 - 30.0 mg/dL  CBC with Differential     Status: Abnormal   Collection Time: 10/06/16 10:31 PM  Result Value Ref Range   WBC 9.9 4.0 - 10.5 K/uL   RBC 5.26 4.22 - 5.81 MIL/uL   Hemoglobin 14.6 13.0 - 17.0 g/dL   HCT 54.8 68.8 - 52.0 %   MCV 84.8 78.0 - 100.0 fL   MCH 27.8 26.0 - 34.0 pg   MCHC 32.7 30.0 - 36.0 g/dL   RDW 74.0 97.9 - 64.1 %   Platelets 274 150 - 400 K/uL   Neutrophils Relative % 47 %   Lymphocytes Relative 39 %   Monocytes Relative 11 %   Eosinophils Relative 3 %   Basophils Relative 0 %   Neutro Abs 4.6 1.7 - 7.7 K/uL   Lymphs Abs 3.9 0.7 - 4.0 K/uL   Monocytes Absolute 1.1 (H) 0.1 - 1.0 K/uL   Eosinophils Absolute 0.3 0.0 - 0.7 K/uL   Basophils Absolute 0.0 0.0 - 0.1 K/uL   WBC Morphology ATYPICAL LYMPHOCYTES   Urinalysis, Routine w reflex microscopic     Status: None   Collection Time: 10/06/16 10:51 PM  Result Value Ref Range   Color, Urine YELLOW YELLOW   APPearance CLEAR CLEAR   Specific Gravity, Urine 1.019 1.005 - 1.030   pH 6.0 5.0 - 8.0   Glucose, UA NEGATIVE NEGATIVE mg/dL   Hgb urine dipstick NEGATIVE NEGATIVE   Bilirubin Urine  NEGATIVE NEGATIVE   Ketones, ur NEGATIVE NEGATIVE mg/dL   Protein, ur NEGATIVE NEGATIVE mg/dL   Nitrite NEGATIVE NEGATIVE   Leukocytes, UA NEGATIVE NEGATIVE  Urine rapid drug screen (hosp performed)     Status: None   Collection Time: 10/06/16 10:51 PM  Result Value Ref Range   Opiates NONE DETECTED NONE DETECTED   Cocaine NONE DETECTED NONE DETECTED   Benzodiazepines NONE DETECTED NONE DETECTED   Amphetamines NONE DETECTED NONE DETECTED   Tetrahydrocannabinol NONE DETECTED NONE DETECTED   Barbiturates NONE DETECTED NONE DETECTED    Comment:        DRUG SCREEN FOR MEDICAL PURPOSES ONLY.  IF CONFIRMATION IS NEEDED FOR ANY PURPOSE, NOTIFY LAB WITHIN 5 DAYS.        LOWEST DETECTABLE LIMITS FOR URINE DRUG SCREEN Drug Class       Cutoff (ng/mL) Amphetamine      1000 Barbiturate      200 Benzodiazepine   200 Tricyclics       300 Opiates          300 Cocaine          300 THC              50     Current Facility-Administered Medications  Medication Dose Route Frequency Provider Last Rate Last Dose  . fluticasone (FLONASE) 50 MCG/ACT nasal spray 2 spray  2 spray Each Nare Daily PRN Maia Plan, MD   2 spray at 10/07/16 0130  . ibuprofen (ADVIL,MOTRIN) tablet 600 mg  600 mg Oral Q6H PRN Maia Plan, MD       Current Outpatient Prescriptions  Medication Sig Dispense Refill  . fluticasone (FLONASE) 50 MCG/ACT nasal spray Place 2 sprays into both nostrils daily. Prn head congestion 16 g 5  . ibuprofen (ADVIL,MOTRIN) 600 MG tablet Take 1 tablet (600 mg total) by mouth every 6 (six) hours as needed. Take with food (Patient taking differently: Take 600 mg by mouth every 6 (six) hours as  needed for moderate pain. Take with food) 20 tablet 0    Musculoskeletal: Unable to assess: camera  Psychiatric Specialty Exam: Physical Exam  Review of Systems  Psychiatric/Behavioral: Positive for depression and hallucinations (auditory ). The patient is nervous/anxious.     Blood pressure  125/69, pulse 94, temperature 97.9 F (36.6 C), temperature source Oral, resp. rate 16, height '5\' 6"'$  (1.676 m), weight 100.3 kg (221 lb 2 oz), SpO2 97 %.Body mass index is 35.69 kg/m.  General Appearance: Casual  Eye Contact:  Good  Speech:  Clear and Coherent and Normal Rate  Volume:  Normal  Mood:  Angry, Anxious and Depressed  Affect:  Congruent and Depressed  Thought Process:  Coherent, Goal Directed and Linear  Orientation:  Full (Time, Place, and Person)  Thought Content:  Logical  Suicidal Thoughts:  No  Homicidal Thoughts:  No  Memory:  Immediate;   Good Recent;   Good Remote;   Fair  Judgement:  Good  Insight:  Good  Psychomotor Activity:  Normal  Concentration:  Concentration: Good and Attention Span: Good  Recall:  Good  Fund of Knowledge:  Good  Language:  Good  Akathisia:  No  Handed:  Right  AIMS (if indicated):     Assets:  Communication Skills Desire for Improvement Financial Resources/Insurance Housing Leisure Time Physical Health Resilience Social Support Vocational/Educational  ADL's:  Intact  Cognition:  WNL  Sleep:        Treatment Plan Summary: Discharge Home  Provide Pt and family with outpatient resources for therapy/psychiatry and possible medication management.  Follow up with PCP for any new or existing medical concerns. Stay well hydrated and eat a well balanced diet Activity as tolerated  Pt's mother requested medications for Pt. In order to ensure Pt is put on the right medications we will defer this to Pt's PCP or Psychiatry on follow up after discharge.    Disposition: No evidence of imminent risk to self or others at present.   Patient does not meet criteria for psychiatric inpatient admission. Supportive therapy provided about ongoing stressors. Discussed crisis plan, support from social network, calling 911, coming to the Emergency Department, and calling Suicide Hotline.  Ethelene Hal, NP 10/07/2016 9:44  AM

## 2016-10-07 NOTE — Telephone Encounter (Signed)
I had discussion with the mother. Once the young man is release form Behavioral Health she will make sure that he follows up with any psychiatrist recommended if she needs our help she will let us know

## 2016-10-07 NOTE — BH Assessment (Signed)
Tele Assessment Note   Elijah [name] is an 21 y.o. male presenting to [facility] reporting increasing anger and auditory hallucinations. PT stated "I am here for anger issues, I can't sleep and I'm depressed". Pt reported that in the past he has punched holes in the walls and he is having trouble keeping a job. Pt denies SI and HI at this time. PT is reporting auditory hallucinations and shared that he hears whispers, animal noises and people talking; however he is unable to understand what they are saying. PT reported that he has trouble sleeping at night and shared that he gets approximately 2-3 hours of sleep at night. No previous suicide attempts or self-injurious behaviors reported. No drug or alcohol use reported. No known access to firearms and pt denies having any pending court dates. No physical, sexual or emotional abuse reported.   Diagnosis: Adjustment Disorder   Past Medical History:  Past Medical History:  Diagnosis Date  . ADD (attention deficit disorder)   . ADHD (attention deficit hyperactivity disorder)   . Learning disability     History reviewed. No pertinent surgical history.  Family History: No family history on file.  Social History:  reports that he has never smoked. He has never used smokeless tobacco. He reports that he does not drink alcohol or use drugs.  Additional Social History:  Alcohol / Drug Use Pain Medications: Pt denies abuse  Prescriptions: Pt denies abuse  Over the Counter: Pt denies abuse  History of alcohol / drug use?: No history of alcohol / drug abuse  CIWA: CIWA-Ar BP: 125/69 Pulse Rate: 94 COWS:    PATIENT STRENGTHS: (choose at least two) Average or above average intelligence Supportive family/friends  Allergies: No Known Allergies  Home Medications:  (Not in a hospital admission)  OB/GYN Status:  No LMP for male patient.  General Assessment Data Location of Assessment: [facility] TTS Assessment: In system Is this a Tele or  Face-to-Face Assessment?: Tele Assessment Is this an Initial Assessment or a Re-assessment for this encounter?: Initial Assessment Marital status: Single Living Arrangements: Other (Comment) (Grandmother ) Can pt return to current living arrangement?: Yes Admission Status: Voluntary Is patient capable of signing voluntary admission?: Yes Referral Source: Self/Family/Friend Insurance type: Medicaid      Crisis Care Plan Living Arrangements: Other (Comment) Database administrator ) Name of Psychiatrist: No provider reported  Name of Therapist: No provider reported.   Education Status Is patient currently in school?: No Highest grade of school patient has completed: 12  Risk to self with the past 6 months Suicidal Ideation: No Has patient been a risk to self within the past 6 months prior to admission? : No Suicidal Intent: No Has patient had any suicidal intent within the past 6 months prior to admission? : No Is patient at risk for suicide?: No Suicidal Plan?: No Has patient had any suicidal plan within the past 6 months prior to admission? : No Access to Means: No What has been your use of drugs/alcohol within the last 12 months?: No drugs or alcohol use reported.  Previous Attempts/Gestures: No How many times?: 0 Other Self Harm Risks: PT denies  Triggers for Past Attempts: None known Intentional Self Injurious Behavior: None Family Suicide History: No Recent stressful life event(s): Other (Comment), Job Loss (Anger) Persecutory voices/beliefs?: No Depression: Yes Depression Symptoms: Tearfulness, Isolating, Fatigue, Feeling worthless/self pity, Feeling angry/irritable Substance abuse history and/or treatment for substance abuse?: No Suicide prevention information given to non-admitted patients: Not applicable  Risk  to Others within the past 6 months Homicidal Ideation: No Does patient have any lifetime risk of violence toward others beyond the six months prior to admission? :  No Thoughts of Harm to Others: No Current Homicidal Intent: No Current Homicidal Plan: No Access to Homicidal Means: No Identified Victim: N/A History of harm to others?: No Assessment of Violence: None Noted Violent Behavior Description: No violent behaviors observed.  Does patient have access to weapons?: No Criminal Charges Pending?: No Does patient have a court date: No Is patient on probation?: No  Psychosis Hallucinations: Auditory Delusions: None noted  Mental Status Report Appearance/Hygiene: In scrubs Eye Contact: Good Motor Activity: Freedom of movement Speech: Logical/coherent Level of Consciousness: Quiet/awake Mood: Euthymic Affect: Appropriate to circumstance Anxiety Level: Minimal Thought Processes: Coherent, Relevant Judgement: Unimpaired Orientation: Person, Place, Time, Situation Obsessive Compulsive Thoughts/Behaviors: None  Cognitive Functioning Concentration: Decreased Memory: Recent Intact, Remote Intact IQ: Average Insight: Fair Impulse Control: Fair Appetite: Good Weight Loss: 0 Weight Gain: 0 Sleep: Decreased Total Hours of Sleep: 3 Vegetative Symptoms: None  ADLScreening [facility] Assessment Services) Patient's cognitive ability adequate to safely complete daily activities?: Yes Patient able to express need for assistance with ADLs?: Yes Independently performs ADLs?: Yes (appropriate for developmental age)  Prior Inpatient Therapy Prior Inpatient Therapy: No  Prior Outpatient Therapy Prior Outpatient Therapy: Yes Prior Therapy Dates:  (Pt unable to recall ) Prior Therapy Facilty/Provider(s):  (Pt unable to recall ) Reason for Treatment:  (Pt unable to recall ) Does patient have an ACCT team?: No Does patient have Intensive In-House Services?  : No Does patient have [facility] services? : No Does patient have P4CC services?: No  ADL Screening (condition at time of admission) Patient's cognitive ability adequate to safely complete daily  activities?: Yes Is the patient deaf or have difficulty hearing?: No Does the patient have difficulty seeing, even when wearing glasses/contacts?: No Does the patient have difficulty concentrating, remembering, or making decisions?: No Patient able to express need for assistance with ADLs?: Yes Does the patient have difficulty dressing or bathing?: No Independently performs ADLs?: Yes (appropriate for developmental age)       Abuse/Neglect Assessment (Assessment to be complete while patient is alone) Physical Abuse: Denies Verbal Abuse: Denies Sexual Abuse: Denies Exploitation of patient/patient's resources: Denies Self-Neglect: Denies     Merchant navy officerAdvance Directives (For Healthcare) Does Patient Have a Medical Advance Directive?: No Would patient like information on creating a medical advance directive?: No - Patient declined    Additional Information 1:1 In Past 12 Months?: No CIRT Risk: No Elopement Risk: No Does patient have medical clearance?: Yes     Disposition:  Disposition Initial Assessment Completed for this Encounter: Yes Disposition of Patient: Other dispositions Other disposition(s): Other (Comment) (AM Psych eval )  Deshone Lyssy S 10/07/2016 1:28 AM

## 2016-10-07 NOTE — ED Notes (Addendum)
Mother contacted. States sister will be coming for Patient

## 2016-10-07 NOTE — ED Notes (Signed)
Pt ambulated to restroom.  PO fluids and snack provided.  Lunch order taken.

## 2016-10-07 NOTE — ED Notes (Addendum)
TTS device at bedside. Assessment underway.  

## 2016-10-07 NOTE — ED Notes (Signed)
Pt does not eat breakfast food-fruit plate ordered

## 2016-10-07 NOTE — Telephone Encounter (Signed)
Left message to return call 

## 2016-10-07 NOTE — ED Notes (Signed)
TTS machine at bedside for reassessment 

## 2016-10-07 NOTE — ED Notes (Addendum)
Flonase dispensed by Main Pharmacy. Placed in Pod F pyxis in Daily Meds area.

## 2016-11-04 ENCOUNTER — Ambulatory Visit (INDEPENDENT_AMBULATORY_CARE_PROVIDER_SITE_OTHER): Payer: Medicaid Other | Admitting: Family Medicine

## 2016-11-04 ENCOUNTER — Encounter: Payer: Self-pay | Admitting: Family Medicine

## 2016-11-04 VITALS — BP 114/76 | Ht 64.0 in | Wt 226.5 lb

## 2016-11-04 DIAGNOSIS — F321 Major depressive disorder, single episode, moderate: Secondary | ICD-10-CM

## 2016-11-04 DIAGNOSIS — K219 Gastro-esophageal reflux disease without esophagitis: Secondary | ICD-10-CM | POA: Diagnosis not present

## 2016-11-04 DIAGNOSIS — F411 Generalized anxiety disorder: Secondary | ICD-10-CM | POA: Diagnosis not present

## 2016-11-04 DIAGNOSIS — F79 Unspecified intellectual disabilities: Secondary | ICD-10-CM | POA: Diagnosis not present

## 2016-11-04 MED ORDER — CLONAZEPAM 0.5 MG PO TABS: 0.5000 mg | ORAL_TABLET | Freq: Every day | ORAL | 0 refills | Status: DC

## 2016-11-04 MED ORDER — PANTOPRAZOLE SODIUM 40 MG PO TBEC: 40.0000 mg | DELAYED_RELEASE_TABLET | Freq: Every day | ORAL | 3 refills | Status: DC

## 2016-11-04 NOTE — Progress Notes (Signed)
   Subjective:    Patient ID: Elijah [name], male    DOB: 1995-11-21, 21 y.o.   MRN: [id number]  HPI Patient was seen today for ADD checkup. -weight, vital signs reviewed.  The following items were covered. -Compliance with medication : Not currently taking any medications. -Problems with completing homework, paying attention/taking good notes in school: Patient states having difficulty focusing.  -grades: N/A  - Eating patterns : Patient states eating habits are bad.   -sleeping: Patient states has difficulties sleeping.  Long discussion held regarding anxiety and depression patient is not suicidal. At times patient feels very depressed. He also suffers with impulsiveness tends to spend all of his money does not manage his funds well suffers with low IQ is disabled. Learning disabilities.  -Additional issues or questions: States no other concerns this visit. 25 minutes was spent with the patient. Greater than half the time was spent in discussion and answering questions and counseling regarding the issues that the patient came in for today. Please see questionnaires Review of Systems Patient denies any chest tightness pressure pain shortness of breath    Objective:   Physical Exam Lungs are clear hearts regular HEENT benign  There is some concern that the could be schizoaffective disorder with patient at times feeling others are talking about him and judging him and at times he thinks he might hear voices     Assessment & Plan:  This patient suffers with low intellectual abilities he is disabled. His mother helps him with finances as best she can  Patient is getting at the point where he wants more independence but struggles with making good choices  I believe the patient is suffering with major depression would benefit from psychiatry he is [facility] will need to do medications with him. Because of his face I do not feel comfortable prescribing  antidepressants. Patient is not suicidal. If he becomes this way he is to immediately seek help here or ER  Insomnia  Insomnia-Klonopin 0.5 mg 1 daily at bedtime when necessary sleep mother to help manage his medication  GERD-discuss proper diet weight loss starting medication.

## 2016-11-07 ENCOUNTER — Telehealth: Payer: Self-pay | Admitting: Family Medicine

## 2016-11-07 NOTE — Addendum Note (Signed)
Addended by: Metro Kung on: 11/07/2016 11:36 AM   Modules accepted: Orders

## 2016-11-07 NOTE — Telephone Encounter (Signed)
Please let the patient know that I did speak with his counselor at [facility]. They will be working at setting him an appointment with the medical doctor for the possibility of medications. They will be calling him with that appointment.

## 2016-11-07 NOTE — Telephone Encounter (Signed)
Spoke with patient and informed him per Dr.Scott Luking- Dr.Scott spoke with your counselor at [facility]. They will be setting you up with an appointment with a medical doctor for possibility of medications. They will be calling you with appointment information. Patient verbalized understanding.

## 2016-11-10 ENCOUNTER — Telehealth: Payer: Self-pay | Admitting: Family Medicine
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
## 2016-11-10 NOTE — Telephone Encounter (Signed)
Spoke with patient's father and informed him Dr.Scott could return his call during lunch or at the end of the day. Patient's dad verbalized understanding and stated that Dr.Scott may call him at any time today. Says he may call him at [phone]

## 2016-11-13 NOTE — Telephone Encounter (Signed)
I discussed the son situation with the dad over the phone on Friday thank you

## 2016-11-22 ENCOUNTER — Encounter: Payer: Medicaid Other | Admitting: Family Medicine

## 2016-12-01 ENCOUNTER — Other Ambulatory Visit: Payer: Self-pay | Admitting: Family Medicine

## 2016-12-01 ENCOUNTER — Encounter: Payer: Medicaid Other | Admitting: Family Medicine

## 2016-12-02 ENCOUNTER — Encounter: Payer: Self-pay | Admitting: Family Medicine

## 2016-12-02 ENCOUNTER — Ambulatory Visit (HOSPITAL_COMMUNITY)
Admission: RE | Admit: 2016-12-02 | Discharge: 2016-12-02 | Disposition: A | Discharge status: Home / Self Care | Payer: Medicaid Other | Source: Ambulatory Visit | Attending: Family Medicine | Admitting: Family Medicine

## 2016-12-02 ENCOUNTER — Ambulatory Visit (INDEPENDENT_AMBULATORY_CARE_PROVIDER_SITE_OTHER): Payer: Medicaid Other | Admitting: Family Medicine

## 2016-12-02 VITALS — BP 120/82 | Temp 98.1°F | Ht 64.0 in | Wt 234.0 lb

## 2016-12-02 DIAGNOSIS — R0789 Other chest pain: Secondary | ICD-10-CM | POA: Insufficient documentation

## 2016-12-02 DIAGNOSIS — X58XXXA Exposure to other specified factors, initial encounter: Secondary | ICD-10-CM | POA: Insufficient documentation

## 2016-12-02 DIAGNOSIS — S20211A Contusion of right front wall of thorax, initial encounter: Secondary | ICD-10-CM | POA: Diagnosis not present

## 2016-12-02 DIAGNOSIS — J019 Acute sinusitis, unspecified: Secondary | ICD-10-CM

## 2016-12-02 MED ORDER — AMOXICILLIN 500 MG PO TABS: 500.0000 mg | ORAL_TABLET | Freq: Three times a day (TID) | ORAL | 0 refills | Status: DC

## 2016-12-02 MED ORDER — IBUPROFEN 600 MG PO TABS: 600.0000 mg | ORAL_TABLET | Freq: Four times a day (QID) | ORAL | 0 refills | Status: DC | PRN

## 2016-12-02 MED ORDER — CLONAZEPAM 0.5 MG PO TABS: 0.5000 mg | ORAL_TABLET | Freq: Every day | ORAL | 0 refills | Status: DC

## 2016-12-02 NOTE — Progress Notes (Signed)
   Subjective:    Patient ID: Elijah [name], male    DOB: 04/17/1996, 21 y.o.   MRN: [id number]  Cough  This is a new problem. The current episode started 1 to 4 weeks ago. Associated symptoms include chest pain, headaches, rhinorrhea and a sore throat. Associated symptoms comments: Vomiting, abdominal Pain .   Patient has concerns of chest pain states that he fell and landed on bricks on right side of chest.    Review of Systems  HENT: Positive for rhinorrhea and sore throat.   Respiratory: Positive for cough.   Cardiovascular: Positive for chest pain.  Neurological: Positive for headaches.   Mild sinus issues with congestion drainage coughing and sinus pain-amoxicillin sent in    Objective:   Physical Exam  Lungs clear hearts regular chest wall mild tenderness on the right side abdomen soft      Assessment & Plan:  Chest wall contusion should gradually get better x-rays ordered to rule out fracture if problems follow-up

## 2016-12-14 ENCOUNTER — Encounter: Payer: Medicaid Other | Admitting: Family Medicine

## 2016-12-21 ENCOUNTER — Encounter: Payer: Self-pay | Admitting: Family Medicine

## 2017-01-15 ENCOUNTER — Encounter: Payer: Self-pay | Admitting: Family Medicine

## 2017-02-11 ENCOUNTER — Emergency Department (HOSPITAL_COMMUNITY)
Admission: EM | Admit: 2017-02-11 | Discharge: 2017-02-11 | Disposition: A | Discharge status: Home / Self Care | Payer: Medicaid Other | Attending: Emergency Medicine | Admitting: Emergency Medicine

## 2017-02-11 ENCOUNTER — Encounter (HOSPITAL_COMMUNITY): Payer: Self-pay | Admitting: Emergency Medicine

## 2017-02-11 DIAGNOSIS — R51 Headache: Secondary | ICD-10-CM | POA: Insufficient documentation

## 2017-02-11 DIAGNOSIS — F79 Unspecified intellectual disabilities: Secondary | ICD-10-CM | POA: Diagnosis not present

## 2017-02-11 DIAGNOSIS — R519 Headache, unspecified: Secondary | ICD-10-CM

## 2017-02-11 DIAGNOSIS — J039 Acute tonsillitis, unspecified: Secondary | ICD-10-CM | POA: Diagnosis not present

## 2017-02-11 LAB — RAPID STREP SCREEN (MED CTR MEBANE ONLY): STREPTOCOCCUS, GROUP A SCREEN (DIRECT): NEGATIVE

## 2017-02-11 MED ORDER — LORATADINE 10 MG PO TABS: 10.0000 mg | ORAL_TABLET | Freq: Every day | ORAL | 0 refills | Status: DC

## 2017-02-11 MED ORDER — KETOROLAC TROMETHAMINE 60 MG/2ML IM SOLN
60.0000 mg | Freq: Once | INTRAMUSCULAR | Status: AC
  Administered 2017-02-11: 60 mg via INTRAMUSCULAR
  Filled 2017-02-11: qty 2

## 2017-02-11 MED ORDER — OXYMETAZOLINE HCL 0.05 % NA SOLN: 1.0000 | Freq: Two times a day (BID) | NASAL | 0 refills | Status: AC

## 2017-02-11 MED ORDER — AMOXICILLIN-POT CLAVULANATE 875-125 MG PO TABS: 1.0000 | ORAL_TABLET | Freq: Two times a day (BID) | ORAL | 0 refills | Status: DC

## 2017-02-11 MED ORDER — FLUTICASONE PROPIONATE 50 MCG/ACT NA SUSP: 2.0000 | Freq: Every day | NASAL | 5 refills | Status: DC

## 2017-02-11 NOTE — ED Triage Notes (Signed)
Pt c/o headache and face pain x 2 weeks.

## 2017-02-11 NOTE — ED Notes (Signed)
ED Provider at bedside. 

## 2017-02-11 NOTE — ED Provider Notes (Signed)
[facility]-EMER[facility]Y DEPT Provider Note   CSN: [id number] Arrival date & time: 02/11/17  1858     History   Chief Complaint Chief Complaint  Patient presents with  . Headache    HPI Virgil Benedictrevor H Traweek is a 21 y.o. male.  HPI Patient presents with one week of generalized headache, facial pain, pain with swallowing, subjective fevers and chills and diffuse myalgias. No photophobia or nausea. No focal weakness or numbness. States that a mild cough without sputum production. Past Medical History:  Diagnosis Date  . ADD (attention deficit disorder)   . ADHD (attention deficit hyperactivity disorder)   . Learning disability     Patient Active Problem List   Diagnosis Date Noted  . Major depressive disorder, recurrent episode, moderate (HCC) 01/15/2017  . Intellectual disability 11/04/2016  . Aggressive behavior   . Auditory hallucinations   . Learning disability 12/21/2014  . ADD (attention deficit disorder) 11/24/2012  . Morbid obesity (HCC) 11/24/2012    History reviewed. No pertinent surgical history.     Home Medications    Prior to Admission medications   Medication Sig Start Date End Date Taking? Authorizing Provider  amoxicillin (AMOXIL) 500 MG tablet Take 1 tablet (500 mg total) by mouth 3 (three) times daily. 12/02/16   Babs SciaraLuking, Scott A, MD  amoxicillin-clavulanate (AUGMENTIN) 875-125 MG tablet Take 1 tablet by mouth 2 (two) times daily. One po bid x 7 days 02/11/17   Loren RacerYelverton, Adelee Hannula, MD  clonazePAM (KLONOPIN) 0.5 MG tablet Take 1 tablet (0.5 mg total) by mouth at bedtime. As needed for sleep 12/02/16   Babs SciaraLuking, Scott A, MD  fluticasone (FLONASE) 50 MCG/ACT nasal spray Place 2 sprays into both nostrils daily. Prn head congestion 02/11/17   Loren RacerYelverton, Regena Delucchi, MD  ibuprofen (ADVIL,MOTRIN) 600 MG tablet Take 1 tablet (600 mg total) by mouth every 6 (six) hours as needed. Take with food 12/02/16   Babs SciaraLuking, Scott A, MD  loratadine (CLARITIN) 10 MG tablet Take 1 tablet (10 mg total)  by mouth daily. 02/11/17   Loren RacerYelverton, Orestes Geiman, MD  oxymetazoline (AFRIN NASAL SPRAY) 0.05 % nasal spray Place 1 spray into both nostrils 2 (two) times daily. 02/11/17 02/14/17  Loren RacerYelverton, Orva Riles, MD  pantoprazole (PROTONIX) 40 MG tablet Take 1 tablet (40 mg total) by mouth daily. 11/04/16   Babs SciaraLuking, Scott A, MD    Family History History reviewed. No pertinent family history.  Social History Social History  Substance Use Topics  . Smoking status: Never Smoker  . Smokeless tobacco: Never Used  . Alcohol use No     Allergies   Patient has no known allergies.   Review of Systems Review of Systems  Constitutional: Positive for chills, fatigue and fever.  HENT: Positive for congestion, sinus pressure, sore throat and trouble swallowing.   Eyes: Negative for photophobia and visual disturbance.  Respiratory: Positive for cough. Negative for choking and shortness of breath.   Cardiovascular: Negative for chest pain and leg swelling.  Gastrointestinal: Negative for abdominal pain, diarrhea, nausea and vomiting.  Genitourinary: Negative for dysuria, flank pain and hematuria.  Musculoskeletal: Positive for arthralgias and myalgias. Negative for back pain, neck pain and neck stiffness.  Skin: Negative for rash and wound.  Neurological: Positive for dizziness, light-headedness and headaches. Negative for syncope, weakness and numbness.  All other systems reviewed and are negative.    Physical Exam Updated Vital Signs BP 138/83   Pulse (!) 111   Temp (!) 97.5 F (36.4 C)   Resp 20  Ht 5\' 5"  (1.651 m)   Wt 104.8 kg (231 lb)   SpO2 95%   BMI 38.44 kg/m   Physical Exam  Constitutional: He is oriented to person, place, and time. He appears well-developed and well-nourished. No distress.  HENT:  Head: Normocephalic and atraumatic.  Mouth/Throat: Oropharynx is clear and moist.  Patient with diffuse scalp tenderness and tenderness to percussion over bilateral frontal sinuses. Bilateral  nasal mucosal edema. Patient has tonsillar hypertrophy with exudates present. Uvula is midline.  Eyes: Pupils are equal, round, and reactive to light. EOM are normal.  Neck: Normal range of motion. Neck supple.  Questional anterior cervical lymphadenopathy. No meningismus  Cardiovascular: Normal rate and regular rhythm.  Exam reveals no gallop and no friction rub.   No murmur heard. Pulmonary/Chest: Effort normal and breath sounds normal. No respiratory distress. He has no wheezes. He has no rales. He exhibits no tenderness.  Abdominal: Soft. Bowel sounds are normal. There is no tenderness. There is no rebound and no guarding.  Musculoskeletal: Normal range of motion. He exhibits no edema or tenderness.  No CVA tenderness bilaterally. No midline thoracic or lumbar tenderness. Distal pulses are intact.  Neurological: He is alert and oriented to person, place, and time.  Moving all extremities without focal deficit. Sensation fully intact.  Skin: Skin is warm and dry. Capillary refill takes less than 2 seconds. No rash noted. No erythema.  Psychiatric: He has a normal mood and affect. His behavior is normal.  Nursing note and vitals reviewed.    ED Treatments / Results  Labs (all labs ordered are listed, but only abnormal results are displayed) Labs Reviewed  RAPID STREP SCREEN (NOT AT [facility])  CULTURE, GROUP A STREP [facility])    EKG  EKG Interpretation None       Radiology No results found.  Procedures Procedures (including critical care time)  Medications Ordered in ED Medications  ketorolac (TORADOL) injection 60 mg (60 mg Intramuscular Given 02/11/17 2008)     Initial Impression / Assessment and Plan / ED Course  I have reviewed the triage vital signs and the nursing notes.  Pertinent labs & imaging results that were available during my care of the patient were reviewed by me and considered in my medical decision making (see chart for details).     Patient states his  headache has resolved. Likely sinusitis versus tonsillitis. Advised to follow-up closely with his primary physician and return precautions given.  Final Clinical Impressions(s) / ED Diagnoses   Final diagnoses:  Sinus headache  Tonsillitis    New Prescriptions New Prescriptions   AMOXICILLIN-CLAVULANATE (AUGMENTIN) 875-125 MG TABLET    Take 1 tablet by mouth 2 (two) times daily. One po bid x 7 days   LORATADINE (CLARITIN) 10 MG TABLET    Take 1 tablet (10 mg total) by mouth daily.   OXYMETAZOLINE (AFRIN NASAL SPRAY) 0.05 % NASAL SPRAY    Place 1 spray into both nostrils 2 (two) times daily.     Loren Racer, MD 02/11/17 2116

## 2017-02-14 LAB — CULTURE, GROUP A STREP (THRC)

## 2017-02-24 ENCOUNTER — Telehealth: Payer: Self-pay | Admitting: Nurse Practitioner

## 2017-02-24 NOTE — Telephone Encounter (Signed)
Mom called wanting to know if Elijah [name] has moved forward to looking into a facility. Mom is wanting to be called by Elijah Jonesarolyn.

## 2017-03-07 ENCOUNTER — Other Ambulatory Visit: Payer: Self-pay | Admitting: Family Medicine

## 2017-03-08 ENCOUNTER — Emergency Department: Payer: Medicaid Other

## 2017-03-08 ENCOUNTER — Emergency Department
Admission: EM | Admit: 2017-03-08 | Discharge: 2017-03-08 | Discharge status: Against Medical Advice | Payer: Medicaid Other | Attending: Student in an Organized Health Care Education/Training Program | Admitting: Student in an Organized Health Care Education/Training Program

## 2017-03-08 ENCOUNTER — Encounter: Payer: Self-pay | Admitting: Emergency Medicine

## 2017-03-08 ENCOUNTER — Other Ambulatory Visit: Payer: Self-pay

## 2017-03-08 ENCOUNTER — Emergency Department (EMERGENCY_DEPARTMENT_HOSPITAL)
Admission: EM | Admit: 2017-03-08 | Discharge: 2017-03-09 | Disposition: A | Discharge status: Other Acute Inpatient Hospital | Payer: Medicaid Other | Source: Home / Self Care | Attending: Student in an Organized Health Care Education/Training Program | Admitting: Student in an Organized Health Care Education/Training Program

## 2017-03-08 DIAGNOSIS — F331 Major depressive disorder, recurrent, moderate: Secondary | ICD-10-CM | POA: Diagnosis not present

## 2017-03-08 DIAGNOSIS — F32A Depression, unspecified: Secondary | ICD-10-CM

## 2017-03-08 DIAGNOSIS — F840 Autistic disorder: Secondary | ICD-10-CM | POA: Diagnosis present

## 2017-03-08 DIAGNOSIS — R45851 Suicidal ideations: Secondary | ICD-10-CM | POA: Insufficient documentation

## 2017-03-08 DIAGNOSIS — F329 Major depressive disorder, single episode, unspecified: Secondary | ICD-10-CM | POA: Insufficient documentation

## 2017-03-08 DIAGNOSIS — K219 Gastro-esophageal reflux disease without esophagitis: Secondary | ICD-10-CM | POA: Diagnosis present

## 2017-03-08 DIAGNOSIS — R109 Unspecified abdominal pain: Secondary | ICD-10-CM | POA: Insufficient documentation

## 2017-03-08 HISTORY — DX: Gastro-esophageal reflux disease without esophagitis: K21.9

## 2017-03-08 HISTORY — DX: Autistic disorder: F84.0

## 2017-03-08 LAB — COMPREHENSIVE METABOLIC PANEL
ALBUMIN: 4.7 g/dL (ref 3.5–5.0)
ALT: 22 U/L (ref 17–63)
ALT: 23 U/L (ref 17–63)
AST: 23 U/L (ref 15–41)
AST: 31 U/L (ref 15–41)
Albumin: 4.6 g/dL (ref 3.5–5.0)
Alkaline Phosphatase: 34 U/L — ABNORMAL LOW (ref 38–126)
Alkaline Phosphatase: 37 U/L — ABNORMAL LOW (ref 38–126)
Anion gap: 9 (ref 5–15)
Anion gap: 11 (ref 5–15)
BILIRUBIN TOTAL: 0.5 mg/dL (ref 0.3–1.2)
BILIRUBIN TOTAL: 0.5 mg/dL (ref 0.3–1.2)
BUN: 9 mg/dL (ref 6–20)
BUN: 8 mg/dL (ref 6–20)
CHLORIDE: 103 mmol/L (ref 101–111)
CHLORIDE: 102 mmol/L (ref 101–111)
CO2: 26 mmol/L (ref 22–32)
CO2: 24 mmol/L (ref 22–32)
CREATININE: 0.85 mg/dL (ref 0.61–1.24)
Calcium: 9.8 mg/dL (ref 8.9–10.3)
Calcium: 9.6 mg/dL (ref 8.9–10.3)
Creatinine, Ser: 0.8 mg/dL (ref 0.61–1.24)
GFR calc Af Amer: >60 mL/min (ref >60)
GFR calc non Af Amer: >60 mL/min (ref >60)
GLUCOSE: 157 mg/dL — AB (ref 65–99)
Glucose, Bld: 112 mg/dL — ABNORMAL HIGH (ref 65–99)
POTASSIUM: 4 mmol/L (ref 3.5–5.1)
Potassium: 4 mmol/L (ref 3.5–5.1)
SODIUM: 137 mmol/L (ref 135–145)
Sodium: 138 mmol/L (ref 135–145)
TOTAL PROTEIN: 8.4 g/dL — AB (ref 6.5–8.1)
Total Protein: 8.2 g/dL — ABNORMAL HIGH (ref 6.5–8.1)

## 2017-03-08 LAB — URINE DRUG SCREEN, QUALITATIVE (ARMC ONLY)
AMPHETAMINES, UR SCREEN: NOT DETECTED
BENZODIAZEPINE, UR SCRN: POSITIVE — AB
Barbiturates, Ur Screen: NOT DETECTED
Cannabinoid 50 Ng, Ur ~~LOC~~: NOT DETECTED
Cocaine Metabolite,Ur ~~LOC~~: NOT DETECTED
MDMA (Ecstasy)Ur Screen: NOT DETECTED
METHADONE SCREEN, URINE: NOT DETECTED
OPIATE, UR SCREEN: NOT DETECTED
PHENCYCLIDINE (PCP) UR S: NOT DETECTED
Tricyclic, Ur Screen: NOT DETECTED

## 2017-03-08 LAB — ETHANOL: Alcohol, Ethyl (B): <5 mg/dL (ref <5)

## 2017-03-08 LAB — CBC
HCT: 40.9 % (ref 40.0–52.0)
HEMATOCRIT: 42.8 % (ref 40.0–52.0)
HEMOGLOBIN: 14.1 g/dL (ref 13.0–18.0)
Hemoglobin: 13.7 g/dL (ref 13.0–18.0)
MCH: 26.8 pg (ref 26.0–34.0)
MCH: 27.1 pg (ref 26.0–34.0)
MCHC: 33.5 g/dL (ref 32.0–36.0)
MCHC: 32.9 g/dL (ref 32.0–36.0)
MCV: 79.9 fL — AB (ref 80.0–100.0)
MCV: 82.3 fL (ref 80.0–100.0)
PLATELETS: 267 10*3/uL (ref 150–440)
Platelets: 255 10*3/uL (ref 150–440)
RBC: 5.13 MIL/uL (ref 4.40–5.90)
RBC: 5.21 MIL/uL (ref 4.40–5.90)
RDW: 14.1 % (ref 11.5–14.5)
RDW: 14.4 % (ref 11.5–14.5)
WBC: 9.2 10*3/uL (ref 3.8–10.6)
WBC: 8.8 10*3/uL (ref 3.8–10.6)

## 2017-03-08 LAB — LIPASE, BLOOD: LIPASE: 33 U/L (ref 11–51)

## 2017-03-08 LAB — ACETAMINOPHEN LEVEL

## 2017-03-08 LAB — TROPONIN I

## 2017-03-08 LAB — SALICYLATE LEVEL: Salicylate Lvl: <7 mg/dL (ref 2.8–30.0)

## 2017-03-08 MED ORDER — ONDANSETRON 4 MG PO TBDP
4.0000 mg | ORAL_TABLET | Freq: Once | ORAL | Status: DC
  Filled 2017-03-08: qty 1

## 2017-03-08 MED ORDER — ONDANSETRON 4 MG PO TBDP
4.0000 mg | ORAL_TABLET | Freq: Once | ORAL | Status: AC
  Administered 2017-03-08: 4 mg via ORAL

## 2017-03-08 MED ORDER — LORAZEPAM 1 MG PO TABS
1.0000 mg | ORAL_TABLET | Freq: Once | ORAL | Status: AC
  Administered 2017-03-08: 1 mg via ORAL

## 2017-03-08 MED ORDER — ONDANSETRON 4 MG PO TBDP
ORAL_TABLET | ORAL | Status: AC
  Administered 2017-03-08: 4 mg via ORAL
  Filled 2017-03-08: qty 1

## 2017-03-08 MED ORDER — TRAZODONE HCL 100 MG PO TABS: 100.0000 mg | ORAL_TABLET | Freq: Every evening | ORAL | Status: DC | PRN

## 2017-03-08 MED ORDER — ZOLPIDEM TARTRATE 5 MG PO TABS
10.0000 mg | ORAL_TABLET | Freq: Every day | ORAL | Status: DC
  Filled 2017-03-08: qty 2

## 2017-03-08 MED ORDER — GI COCKTAIL ~~LOC~~
30.0000 mL | Freq: Once | ORAL | Status: AC
  Administered 2017-03-08: 30 mL via ORAL
  Filled 2017-03-08: qty 30

## 2017-03-08 MED ORDER — LORAZEPAM 1 MG PO TABS
ORAL_TABLET | ORAL | Status: AC
  Administered 2017-03-08: 1 mg via ORAL
  Filled 2017-03-08: qty 1

## 2017-03-08 NOTE — ED Notes (Signed)
Pt alert and awake, states, "can I have my medicine to help me sleep?"  Will give Ambien dose at this time.

## 2017-03-08 NOTE — ED Provider Notes (Addendum)
-----------------------------------------   8:56 PM on 03/08/2017 -----------------------------------------   Blood pressure 124/63, pulse 83, temperature 98.4 F (36.9 C), temperature source Oral, resp. rate 18, height 5\' 6"  (1.676 m), weight 104.3 kg (230 lb), SpO2 100 %.  Patient complaining of intermittent burning and sharp chest, upper abdominal throat pain. History of reflux. We will try him with Zofran as well as GI cocktail and reassess. Pharynx exam without any swelling or erythema. No tenderness to the abdomen at this time. Patient also with nausea but no vomiting.     Myrna BlazerSchaevitz, David Matthew, MD 03/08/17 2057 ED ECG REPORT I, Arelia LongestSchaevitz,  David M, the attending physician, personally viewed and interpreted this ECG.   Date: 03/08/2017  EKG Time: 2006  Rate: 87  Rhythm: normal sinus rhythm  Axis: Normal  Intervals:none  ST&T Change: No ST segment elevation or depression. No abnormal T-wave inversion.    Myrna BlazerSchaevitz, David Matthew, MD 03/08/17 2104

## 2017-03-08 NOTE — ED Notes (Signed)
Patient autistic at baseline. Per mother, patient recently moved into an apartment with a friend. States patient has had a difficult time getting a job and feels like people are staring at him and judging him. Patient states, "I just don't understand. I don't want to be useless." Patient calm and cooperative, interacting appropriately with this RN and MD.

## 2017-03-08 NOTE — ED Notes (Signed)
Patient in room 22 with mother and psychiatrist

## 2017-03-08 NOTE — ED Notes (Addendum)
Pt states, "I just dont feel good, I am hurting in my stomach and my chest."  Pt dry heaving.  Vitals WDL at this time.  ED MD notified.

## 2017-03-08 NOTE — ED Notes (Signed)
Patient requesting shower. Pt given scrubs, toiletries and towels and instructed on shower

## 2017-03-08 NOTE — ED Notes (Signed)
Pt given a sandwich tray ?

## 2017-03-08 NOTE — ED Notes (Signed)
Pt reports that he almost fell in the shower because he dropped his soap. Pt said he caught himself on the wall of the shower. Pt now sitting in dayroom watching tv. Pt remains safe with 15 minute checks.

## 2017-03-08 NOTE — ED Notes (Signed)
[facility]. Pt wanded and oriented to unit. Pt given dinner box and ginger ale, remote and tv guide. Pt smiling, asks,"Can I have a sandwich after this? I haven't eaten all day." Pt denies pain, dizziness and nausea. Pt encouraged to voice concerns and ask questions. Pt remains safe with 15 minute checks.

## 2017-03-08 NOTE — ED Notes (Signed)
[facility].  Pt ambulatory without difficulty.

## 2017-03-08 NOTE — Telephone Encounter (Signed)
Mom called back regarding this. Mom states that this is urgent for carolyn to call her. Pt had an episode last night and is on his way to rha in [place]. Please advise.

## 2017-03-08 NOTE — ED Notes (Addendum)
While Writer was sitting down with pt conversing in dayroom, pt suddenly states, "I feel funny". Pt also reports feeling SOB, having trouble talking.  Pt states,"this is how I was feeling last night." VS taken. Charge nurse and Morrie SheldonAshley, RN notified of pt's symptoms and unsteady gait. Pt will be moved back to [facility] [address] for pt's safety.

## 2017-03-08 NOTE — Telephone Encounter (Signed)
It is best for her young man not to be on a medication like this. It can be habit forming. It is best that he discuss this with mental health. Refuse refill

## 2017-03-08 NOTE — ED Provider Notes (Signed)
[facility] Emergency Department Provider Note    First MD Initiated Contact with Patient 03/08/17 1230     (approximate)  I have reviewed the triage vital signs and the nursing notes.   HISTORY  Chief Complaint Psychiatric Evaluation    HPI Elijah [name] is a 21 y.o. male with a history of also some lung disability presents with several days of worsening depressive symptoms including anhedonia and decreased sleep easy irritability and passive suicidal ideation. No new changes in medications. Says did recently change his living location which may have exacerbated his symptoms. Grandmother who is at bedside with him the patient has been having hallucinations where he thinks that people are making faces at him. Has been demonstrating some signs of paranoia. No trauma.  No drug abuse.   Past Medical History:  Diagnosis Date  . Acid reflux   . ADD (attention deficit disorder)   . ADHD (attention deficit hyperactivity disorder)   . Autism   . Learning disability    History reviewed. No pertinent family history. History reviewed. No pertinent surgical history. Patient Active Problem List   Diagnosis Date Noted  . Autism spectrum disorder 03/08/2017  . Major depressive disorder, recurrent episode, moderate (HCC) 01/15/2017  . Intellectual disability 11/04/2016  . Aggressive behavior   . Auditory hallucinations   . Learning disability 12/21/2014  . ADD (attention deficit disorder) 11/24/2012  . Morbid obesity (HCC) 11/24/2012      Prior to Admission medications   Medication Sig Start Date End Date Taking? Authorizing Provider  amoxicillin (AMOXIL) 500 MG tablet Take 1 tablet (500 mg total) by mouth 3 (three) times daily. 12/02/16   Babs SciaraLuking, Scott A, MD  amoxicillin-clavulanate (AUGMENTIN) 875-125 MG tablet Take 1 tablet by mouth 2 (two) times daily. One po bid x 7 days 02/11/17   Loren RacerYelverton, David, MD  clonazePAM (KLONOPIN) 0.5 MG tablet Take 1  tablet (0.5 mg total) by mouth at bedtime. As needed for sleep 12/02/16   Babs SciaraLuking, Scott A, MD  fluticasone (FLONASE) 50 MCG/ACT nasal spray Place 2 sprays into both nostrils daily. Prn head congestion 02/11/17   Loren RacerYelverton, David, MD  ibuprofen (ADVIL,MOTRIN) 600 MG tablet Take 1 tablet (600 mg total) by mouth every 6 (six) hours as needed. Take with food 12/02/16   Babs SciaraLuking, Scott A, MD  loratadine (CLARITIN) 10 MG tablet Take 1 tablet (10 mg total) by mouth daily. 02/11/17   Loren RacerYelverton, David, MD  pantoprazole (PROTONIX) 40 MG tablet Take 1 tablet (40 mg total) by mouth daily. 11/04/16   Babs SciaraLuking, Scott A, MD    Allergies Patient has no known allergies.    Social History Social History  Substance Use Topics  . Smoking status: Never Smoker  . Smokeless tobacco: Never Used  . Alcohol use Yes     Comment: occassional    Review of Systems Patient denies headaches, rhinorrhea, blurry vision, numbness, shortness of breath, chest pain, edema, cough, abdominal pain, nausea, vomiting, diarrhea, dysuria, fevers, rashes or hallucinations unless otherwise stated above in HPI. ____________________________________________   PHYSICAL EXAM:  VITAL SIGNS: Vitals:   03/08/17 1159  BP: 138/71  Pulse: 95  Resp: 18  Temp: 98.2 F (36.8 C)  SpO2: 97%    Constitutional: Alert and oriented. in no acute distress. Eyes: Conjunctivae are normal.  Head: Atraumatic. Nose: No congestion/rhinnorhea. Mouth/Throat: Mucous membranes are moist.   Neck: No stridor. Painless ROM.  Cardiovascular: Normal rate, regular rhythm. Grossly normal heart sounds.  Good peripheral circulation.  Respiratory: Normal respiratory effort.  No retractions. Lungs CTAB. Gastrointestinal: Soft and nontender. No distention. No abdominal bruits. No CVA tenderness. Genitourinary:  Musculoskeletal: No lower extremity tenderness nor edema.  No joint effusions. Neurologic:  Normal speech and language. No gross focal neurologic deficits  are appreciated. No facial droop Skin:  Skin is warm, dry and intact. No rash noted. Psychiatric: withdrawn, limited eye contact, no psychomotor agitation.  ____________________________________________   LABS (all labs ordered are listed, but only abnormal results are displayed)  Results for orders placed or performed during the hospital encounter of 03/08/17 (from the past 24 hour(s))  Comprehensive metabolic panel     Status: Abnormal   Collection Time: 03/08/17 12:07 PM  Result Value Ref Range   Sodium 137 135 - 145 mmol/L   Potassium 4.0 3.5 - 5.1 mmol/L   Chloride 102 101 - 111 mmol/L   CO2 24 22 - 32 mmol/L   Glucose, Bld 157 (H) 65 - 99 mg/dL   BUN 8 6 - 20 mg/dL   Creatinine, Ser 1.61 0.61 - 1.24 mg/dL   Calcium 9.6 8.9 - 09.6 mg/dL   Total Protein 8.2 (H) 6.5 - 8.1 g/dL   Albumin 4.7 3.5 - 5.0 g/dL   AST 31 15 - 41 U/L   ALT 23 17 - 63 U/L   Alkaline Phosphatase 37 (L) 38 - 126 U/L   Total Bilirubin 0.5 0.3 - 1.2 mg/dL   GFR calc non Af Amer >60 >60 mL/min   GFR calc Af Amer >60 >60 mL/min   Anion gap 11 5 - 15  Ethanol     Status: None   Collection Time: 03/08/17 12:07 PM  Result Value Ref Range   Alcohol, Ethyl (B) <5 <5 mg/dL  Salicylate level     Status: None   Collection Time: 03/08/17 12:07 PM  Result Value Ref Range   Salicylate Lvl <7.0 2.8 - 30.0 mg/dL  Acetaminophen level     Status: Abnormal   Collection Time: 03/08/17 12:07 PM  Result Value Ref Range   Acetaminophen (Tylenol), Serum <10 (L) 10 - 30 ug/mL  cbc     Status: None   Collection Time: 03/08/17 12:07 PM  Result Value Ref Range   WBC 8.8 3.8 - 10.6 K/uL   RBC 5.21 4.40 - 5.90 MIL/uL   Hemoglobin 14.1 13.0 - 18.0 g/dL   HCT 04.5 40.9 - 81.1 %   MCV 82.3 80.0 - 100.0 fL   MCH 27.1 26.0 - 34.0 pg   MCHC 32.9 32.0 - 36.0 g/dL   RDW 91.4 78.2 - 95.6 %   Platelets 255 150 - 440 K/uL  Urine Drug Screen, Qualitative     Status: Abnormal   Collection Time: 03/08/17 12:07 PM  Result Value Ref  Range   Tricyclic, Ur Screen NONE DETECTED NONE DETECTED   Amphetamines, Ur Screen NONE DETECTED NONE DETECTED   MDMA (Ecstasy)Ur Screen NONE DETECTED NONE DETECTED   Cocaine Metabolite,Ur [place] NONE DETECTED NONE DETECTED   Opiate, Ur Screen NONE DETECTED NONE DETECTED   Phencyclidine (PCP) Ur S NONE DETECTED NONE DETECTED   Cannabinoid 50 Ng, Ur [place] NONE DETECTED NONE DETECTED   Barbiturates, Ur Screen NONE DETECTED NONE DETECTED   Benzodiazepine, Ur Scrn POSITIVE (A) NONE DETECTED   Methadone Scn, Ur NONE DETECTED NONE DETECTED   ____________________________________________ ____________________________________________  RADIOLOGY  I personally reviewed all radiographic images ordered to evaluate for the above acute complaints and reviewed radiology reports and findings.  These findings were personally discussed with the patient.  Please see medical record for radiology report.  ____________________________________________   PROCEDURES  Procedure(s) performed:  Procedures    Critical Care performed: no ____________________________________________   INITIAL IMPRESSION / ASSESSMENT AND PLAN / ED COURSE  Pertinent labs & imaging results that were available during my care of the patient were reviewed by me and considered in my medical decision making (see chart for details).  DDX: Psychosis, delirium, medication effect, noncompliance, polysubstance abuse, Si, Hi, depression   KEVONTA PHARISS is a 21 y.o. who presents to the ED with for evaluation of above symptoms.  Patient has psych history of ADD and autism.  Laboratory testing was ordered to evaluation for underlying electrolyte derangement or signs of underlying organic pathology to explain today's presentation. CT head ordered to evaluated for anatomic cause of change in behavior.  Based on history and physical and laboratory evaluation, it appears that the patient's presentation is 2/2 underlying psychiatric disorder and  will benefit from further evaluation and management by inpatient psychiatry.  Patient is here voluntary at this time.  I do not believe he meets IVC criteria.  Disposition pending psychiatric evaluation.       ____________________________________________   FINAL CLINICAL IMPRESSION(S) / ED DIAGNOSES  Final diagnoses:  Depression, unspecified depression type  Suicidal ideation      NEW MEDICATIONS STARTED DURING THIS VISIT:  New Prescriptions   No medications on file     Note:  This document was prepared using Dragon voice recognition software and may include unintentional dictation errors.    Willy Eddy, MD 03/08/17 1520

## 2017-03-08 NOTE — ED Triage Notes (Signed)
Pt to triage via w/c with no distress noted; c/o mid CP, abd pain and N/V tonight; st hx reflux

## 2017-03-08 NOTE — ED Notes (Signed)
Pt c/o abdominal pain and chest pain. RN, Morrie Sheldonshley has been notified.

## 2017-03-08 NOTE — ED Notes (Signed)
Pt's mother at bedside with pt, pt and family updated of pt's plan of care w/ verbal understanding.  Pt very anxious about being admitted, pt appears restless.  MD notified; see new orders.

## 2017-03-08 NOTE — ED Triage Notes (Signed)
Pt in via POV with mother.  Pt reports dizziness, hallucinations, and difficulty sleeping x "a couple of weeks."  Per mother, pt has stated multiple times, "I just dont want to live like this anymore."  Pt is autistic at baseline.

## 2017-03-08 NOTE — Consult Note (Signed)
[place] Psychiatry Consult   Reason for Consult:  Suicidal ideation. Referring Physician:  Dr. Quentin Cornwall Patient Identification: Elijah [name] MRN:  [id number] Principal Diagnosis: Major depressive disorder, recurrent episode, moderate ([place]) Diagnosis:   Patient Active Problem List   Diagnosis Date Noted  . Autism spectrum disorder [F84.0] 03/08/2017  . Major depressive disorder, recurrent episode, moderate ([place]) [F33.1] 01/15/2017  . Intellectual disability [F79] 11/04/2016  . Aggressive behavior [R45.89]   . Auditory hallucinations [R44.0]   . Learning disability [F81.9] 12/21/2014  . ADD (attention deficit disorder) [F98.8] 11/24/2012  . Morbid obesity ([place]) [E66.01] 11/24/2012    Total Time spent with patient: 1 hour  Subjective:    Identifying data. Elijah [name] is a 21 year old male with a history of ADHD and autism.  Chief complaint. "I don't feel good."  History of present illness. Information was obtained from the patient and the chart. The patient was brought to the emergency room by his mother for a remarkable change in patient's demeanor. He usually is talkative and upbeat but he has not been well for the past 2 weeks complaining of severe insomnia, depression, paranoia, auditory hallucinations, and crying spells. He also on multiple occasions expressed hopelessness and suicidal thinking without plan. His condition deteriorated in the last 24 hours. He didn't feel well physically, experienced nausea and vomiting, his speech became slurred, he felt dizzy and unsteady. His mother took him to [place] but she was told that they would not be able to provide care for the patient. He was in the emergency room twice in the last 24 hours returning with no improvement. The patient himself has difficulties describing how he feels and oftentimes refers to his mother to explain. My understanding is that he has been under considerable stress lately. He out of his parents house and  now lives in [place] with a trusted roommate. There is sickness in the family. The patient also has been looking for a job but has not been successful. This has been very disappointing. He feels like a burden and a failure. 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 He hears conversations in his head over and over. He also experiences auditory hallucinations believing that people are talking in the driveway. He hears voices knocking and cracking. He is paranoid about neighbors. He feels that people watch him and talking about him. She denies panic attacks but does have social anxiety. There are no PTSD or OCD symptoms. He does not report any symptoms suggestive of bipolar mania but was at [facility] emergency room in March 2018 where he presented with borderline manic symptoms.  Past psychiatric history. The mother said that he was never hospitalized but there is information in the chart to say that he was hospitalized when younger. He has diagnosis of ADHD and was treated with Ritalin for a period of time. I don't think he is prescribed. Ritalin and a longer and does not want to take it. This was prescribed by his primary care physician who has known this patient or his life. Reportedly his PCP prescribes clonazepam for depression also. He also sees health care professionals at [facility] but the mother is unsure what medications he is getting there. She is unsure if he is compliant.  Family psychiatric history. Nonreported.  Social history. He graduated from high school. He is disabled and has Medicaid. For the past few weeks he has been living independently with a roommate. He drives a car. He has  supportive family.  Risk to Self: Is patient at risk for suicide?: No Risk to Others:   Prior Inpatient Therapy:   Prior Outpatient Therapy:    Past Medical History:  Past Medical History:  Diagnosis Date  . Acid reflux   . ADD (attention deficit disorder)    . ADHD (attention deficit hyperactivity disorder)   . Autism   . Learning disability    History reviewed. No pertinent surgical history. Family History: History reviewed. No pertinent family history.  Social History:  History  Alcohol Use  . Yes    Comment: occassional     History  Drug Use No    Social History   Social History  . Marital status: Single    Spouse name: N/A  . Number of children: N/A  . Years of education: N/A   Social History Main Topics  . Smoking status: Never Smoker  . Smokeless tobacco: Never Used  . Alcohol use Yes     Comment: occassional  . Drug use: No  . Sexual activity: Not Asked   Other Topics Concern  . None   Social History Narrative  . None   Additional Social History:    Allergies:  No Known Allergies  Labs:  Results for orders placed or performed during the hospital encounter of 03/08/17 (from the past 48 hour(s))  Comprehensive metabolic panel     Status: Abnormal   Collection Time: 03/08/17 12:07 PM  Result Value Ref Range   Sodium 137 135 - 145 mmol/L   Potassium 4.0 3.5 - 5.1 mmol/L   Chloride 102 101 - 111 mmol/L   CO2 24 22 - 32 mmol/L   Glucose, Bld 157 (H) 65 - 99 mg/dL   BUN 8 6 - 20 mg/dL   Creatinine, Ser 0.80 0.61 - 1.24 mg/dL   Calcium 9.6 8.9 - 10.3 mg/dL   Total Protein 8.2 (H) 6.5 - 8.1 g/dL   Albumin 4.7 3.5 - 5.0 g/dL   AST 31 15 - 41 U/L   ALT 23 17 - 63 U/L   Alkaline Phosphatase 37 (L) 38 - 126 U/L   Total Bilirubin 0.5 0.3 - 1.2 mg/dL   GFR calc non Af Amer >60 >60 mL/min   GFR calc Af Amer >60 >60 mL/min    Comment: (NOTE) The eGFR has been calculated using the CKD EPI equation. This calculation has not been validated in all clinical situations. eGFR's persistently <60 mL/min signify possible Chronic Kidney Disease.    Anion gap 11 5 - 15  Ethanol     Status: None   Collection Time: 03/08/17 12:07 PM  Result Value Ref Range   Alcohol, Ethyl (B) <5 <5 mg/dL    Comment:        LOWEST  DETECTABLE LIMIT FOR SERUM ALCOHOL IS 5 mg/dL FOR MEDICAL PURPOSES ONLY   Salicylate level     Status: None   Collection Time: 03/08/17 12:07 PM  Result Value Ref Range   Salicylate Lvl <5.7 2.8 - 30.0 mg/dL  Acetaminophen level     Status: Abnormal   Collection Time: 03/08/17 12:07 PM  Result Value Ref Range   Acetaminophen (Tylenol), Serum <10 (L) 10 - 30 ug/mL    Comment:        THERAPEUTIC CONCENTRATIONS VARY SIGNIFICANTLY. A RANGE OF 10-30 ug/mL MAY BE AN EFFECTIVE CONCENTRATION FOR MANY PATIENTS. HOWEVER, SOME ARE BEST TREATED AT CONCENTRATIONS OUTSIDE THIS RANGE. ACETAMINOPHEN CONCENTRATIONS >150 ug/mL AT 4 HOURS AFTER INGESTION AND >50  ug/mL AT 12 HOURS AFTER INGESTION ARE OFTEN ASSOCIATED WITH TOXIC REACTIONS.   cbc     Status: None   Collection Time: 03/08/17 12:07 PM  Result Value Ref Range   WBC 8.8 3.8 - 10.6 K/uL   RBC 5.21 4.40 - 5.90 MIL/uL   Hemoglobin 14.1 13.0 - 18.0 g/dL   HCT 42.9 85.3 - 92.1 %   MCV 82.3 80.0 - 100.0 fL   MCH 27.1 26.0 - 34.0 pg   MCHC 32.9 32.0 - 36.0 g/dL   RDW 29.9 88.5 - 89.8 %   Platelets 255 150 - 440 K/uL  Urine Drug Screen, Qualitative     Status: Abnormal   Collection Time: 03/08/17 12:07 PM  Result Value Ref Range   Tricyclic, Ur Screen NONE DETECTED NONE DETECTED   Amphetamines, Ur Screen NONE DETECTED NONE DETECTED   MDMA (Ecstasy)Ur Screen NONE DETECTED NONE DETECTED   Cocaine Metabolite,Ur [place] NONE DETECTED NONE DETECTED   Opiate, Ur Screen NONE DETECTED NONE DETECTED   Phencyclidine (PCP) Ur S NONE DETECTED NONE DETECTED   Cannabinoid 50 Ng, Ur [place] NONE DETECTED NONE DETECTED   Barbiturates, Ur Screen NONE DETECTED NONE DETECTED   Benzodiazepine, Ur Scrn POSITIVE (A) NONE DETECTED   Methadone Scn, Ur NONE DETECTED NONE DETECTED    Comment: (NOTE) 100  Tricyclics, urine               Cutoff 1000 ng/mL 200  Amphetamines, urine             Cutoff 1000 ng/mL 300  MDMA (Ecstasy), urine           Cutoff 500  ng/mL 400  Cocaine Metabolite, urine       Cutoff 300 ng/mL 500  Opiate, urine                   Cutoff 300 ng/mL 600  Phencyclidine (PCP), urine      Cutoff 25 ng/mL 700  Cannabinoid, urine              Cutoff 50 ng/mL 800  Barbiturates, urine             Cutoff 200 ng/mL 900  Benzodiazepine, urine           Cutoff 200 ng/mL 1000 Methadone, urine                Cutoff 300 ng/mL 1100 1200 The urine drug screen provides only a preliminary, unconfirmed 1300 analytical test result and should not be used for non-medical 1400 purposes. Clinical consideration and professional judgment should 1500 be applied to any positive drug screen result due to possible 1600 interfering substances. A more specific alternate chemical method 1700 must be used in order to obtain a confirmed analytical result.  1800 Gas chromato graphy / mass spectrometry (GC/MS) is the preferred 1900 confirmatory method.     No current facility-administered medications for this encounter.    Current Outpatient Prescriptions  Medication Sig Dispense Refill  . amoxicillin (AMOXIL) 500 MG tablet Take 1 tablet (500 mg total) by mouth 3 (three) times daily. 30 tablet 0  . amoxicillin-clavulanate (AUGMENTIN) 875-125 MG tablet Take 1 tablet by mouth 2 (two) times daily. One po bid x 7 days 14 tablet 0  . clonazePAM (KLONOPIN) 0.5 MG tablet Take 1 tablet (0.5 mg total) by mouth at bedtime. As needed for sleep 30 tablet 0  . fluticasone (FLONASE) 50 MCG/ACT nasal spray Place 2 sprays into both nostrils daily.  Prn head congestion 16 g 5  . ibuprofen (ADVIL,MOTRIN) 600 MG tablet Take 1 tablet (600 mg total) by mouth every 6 (six) hours as needed. Take with food 20 tablet 0  . loratadine (CLARITIN) 10 MG tablet Take 1 tablet (10 mg total) by mouth daily. 30 tablet 0  . pantoprazole (PROTONIX) 40 MG tablet Take 1 tablet (40 mg total) by mouth daily. 30 tablet 3    Musculoskeletal: Strength & Muscle Tone: within normal limits Gait &  Station: normal Patient leans: N/A  Psychiatric Specialty Exam: I reviewed physical exam performed in the ER and agree with the findings. Physical Exam  Nursing note and vitals reviewed. Psychiatric: His mood appears anxious. His affect is labile. His speech is slurred. He is slowed, withdrawn and actively hallucinating. Cognition and memory are normal. He expresses impulsivity. He exhibits a depressed mood. He expresses suicidal ideation.    Review of Systems  Gastrointestinal: Positive for nausea and vomiting.  Neurological: Positive for dizziness and speech change.  Psychiatric/Behavioral: Positive for depression, hallucinations and suicidal ideas. The patient is nervous/anxious and has insomnia.   All other systems reviewed and are negative.   Blood pressure 138/71, pulse 95, temperature 98.2 F (36.8 C), temperature source Oral, resp. rate 18, height '5\' 6"'$  (1.676 m), weight 104.3 kg (230 lb), SpO2 97 %.Body mass index is 37.12 kg/m.  General Appearance: Casual  Eye Contact:  Good   Speech:  Slurred  Volume:  Decreased  Mood:  Anxious, Depressed and Hopeless  Affect:  Blunt  Thought Process:  Goal Directed and Descriptions of Associations: Intact  Orientation:  Full (Time, Place, and Person)  Thought Content:  Hallucinations: Auditory, Paranoid Ideation and Rumination  Suicidal Thoughts:  Yes.  with intent/plan  Homicidal Thoughts:  No  Memory:  Immediate;   Fair Recent;   Fair Remote;   Fair  Judgement:  Impaired  Insight:  Shallow  Psychomotor Activity:  Psychomotor Retardation  Concentration:  Concentration: Fair and Attention Span: Fair  Recall:  [employer] of Knowledge:  Fair  Language:  Fair  Akathisia:  No  Handed:  Right  AIMS (if indicated):     Assets:  Communication Skills Desire for Improvement Financial Resources/Insurance Housing Physical Health Resilience Social Support Transportation  ADL's:  Intact  Cognition:  WNL  Sleep:        Treatment  Plan Summary: Daily contact with patient to assess and evaluate symptoms and progress in treatment and Medication management   PLAN: 1. The patient will be admitted to psychiatry tomorrow when bed available.  Case discussed with Dr. Quentin Cornwall. The patient and his mother in agreement.    Disposition: Recommend psychiatric Inpatient admission when medically cleared. Supportive therapy provided about ongoing stressors. Discussed crisis plan, support from social network, calling 911, coming to the Emergency Department, and calling Suicide Hotline.  Orson Slick, MD 03/08/2017 3:24 PM

## 2017-03-08 NOTE — ED Notes (Signed)
Pt resting comfortably at this time, will hold Ambien unless needed.

## 2017-03-08 NOTE — BH Assessment (Addendum)
Tele Assessment Note   Elijah [name] is an 21 y.o. male presenting voluntarily for assessment accompanied by his mother. Information obtained from pt and mother interview, as well as review of pt chart. Pt has h/o autisim (previously asperger's) and ADHD. Pt states he has been feeling "fat, sad, ill and annoyed". Pt reports ongoing frustration from job search. Pt shared that employers continue to tell him he needs to acquire additional skills however, none have offered to assist him in obtaining needed skills. Pt has also moved out of the home with his mother into a place with a roommate.  Pt has made multiple statements regarding no longer wanting to live. Pt reports h/o SI intent however, denies intent at time of interview. Pt has no h/o of suicide attempt or self-harm. Pt has h/o one inpatient admission for one night at [facility].  Pt experiences AH of people knocking on doors and cars pulling up in his driveway-this occurs both at his mother's home and his current residence. Pt experiences sleep disturbance, receiving approximately 3hrs of sleep per night. Pt denies HI/thoughts of harming others. Pt denies legal hx. Mom denied pt h/o aggression. Pt denies substance use.  Diagnosis: ADHD, ASD  Past Medical History:  Past Medical History:  Diagnosis Date  . Acid reflux   . ADD (attention deficit disorder)   . ADHD (attention deficit hyperactivity disorder)   . Autism   . Learning disability     History reviewed. No pertinent surgical history.  Family History: History reviewed. No pertinent family history.  Social History:  reports that he has never smoked. He has never used smokeless tobacco. He reports that he drinks alcohol. He reports that he does not use drugs.  Additional Social History:  Alcohol / Drug Use Pain Medications: Pt denies abuse. Prescriptions: Pt denies abuse. Over the Counter: Pt denies abuse. History of alcohol / drug use?: No history of alcohol / drug  abuse Longest period of sobriety (when/how long): Pt denies abuse.  CIWA: CIWA-Ar BP: 138/71 Pulse Rate: 95 COWS:    PATIENT STRENGTHS: (choose at least two) Average or above average intelligence Supportive family/friends  Allergies: No Known Allergies  Home Medications:  (Not in a hospital admission)  OB/GYN Status:  No LMP for male patient.  General Assessment Data TTS Assessment: In system Is this a Tele or Face-to-Face Assessment?: Face-to-Face Is this an Initial Assessment or a Re-assessment for this encounter?: Initial Assessment Marital status: Single Is patient pregnant?: No Pregnancy Status: No Living Arrangements: Non-relatives/Friends Can pt return to current living arrangement?: Yes Admission Status: Voluntary Is patient capable of signing voluntary admission?: Yes Referral Source: Self/Family/Friend Insurance type: Medicaid     Crisis Care Plan Living Arrangements: Non-relatives/Friends [facility]  Education Status Is patient currently in school?: No Highest grade of school patient has completed: 12  Risk to self with the past 6 months Suicidal Ideation: Yes-Currently Present Has patient been a risk to self within the past 6 months prior to admission? : No Suicidal Intent: No Has patient had any suicidal intent within the past 6 months prior to admission? : Yes Is patient at risk for suicide?: No Suicidal Plan?: No Has patient had any suicidal plan within the past 6 months prior to admission? : Yes What has been your use of drugs/alcohol within the last 12 months?: Pt denies drug/alcohol use Previous Attempts/Gestures: No Other Self Harm Risks: None Reported Triggers for Past Attempts: Unknown  Intentional Self Injurious Behavior: None Family Suicide History: No Recent stressful life event(s): Other (Comment) (Stressors regarding living situation) Persecutory voices/beliefs?: Yes Depression:  Yes Depression Symptoms: Feeling angry/irritable Substance abuse history and/or treatment for substance abuse?: No Suicide prevention information given to non-admitted patients: Not applicable  Risk to Others within the past 6 months Homicidal Ideation: No Does patient have any lifetime risk of violence toward others beyond the six months prior to admission? : No Thoughts of Harm to Others: No Current Homicidal Intent: No Current Homicidal Plan: No Access to Homicidal Means: No History of harm to others?: No Assessment of Violence: None Noted Does patient have access to weapons?: No Criminal Charges Pending?: No Does patient have a court date: No Is patient on probation?: No  Psychosis Hallucinations: Auditory (Non-command) Delusions: Persecutory  Mental Status Report Appearance/Hygiene: In scrubs Eye Contact: Good Motor Activity: Freedom of movement Speech: Logical/coherent Level of Consciousness: Alert Mood: Anxious Affect: Anxious Anxiety Level: Moderate Thought Processes: Coherent, Relevant Judgement: Partial Orientation: Person, Place, Time, Situation Obsessive Compulsive Thoughts/Behaviors: None  Cognitive Functioning Concentration: Decreased Memory: Recent Intact, Remote Intact IQ: Average Insight: Fair Impulse Control: Fair Appetite: Good Weight Loss: 0 Weight Gain:  ("a lot" per mom) Sleep: Decreased Total Hours of Sleep: 3 Vegetative Symptoms: None  ADLScreening [facility] Assessment Services) Patient's cognitive ability adequate to safely complete daily activities?: Yes Patient able to express need for assistance with ADLs?: No Independently performs ADLs?: Yes (appropriate for developmental age)  Prior Inpatient Therapy Prior Inpatient Therapy: Yes Prior Therapy Dates: 2018 Prior Therapy Facilty/Provider(s): [facility] Reason for Treatment: Not Reported  Prior Outpatient Therapy Prior Outpatient Therapy: Yes Prior Therapy Dates: Ongoing Prior  Therapy Facilty/Provider(s): [facility] Reason for Treatment: "to talk" per pt Does patient have an ACCT team?: No Does patient have Intensive In-House Services?  : No Does patient have [facility] services? : No Does patient have P4CC services?: No  ADL Screening (condition at time of admission) Patient's cognitive ability adequate to safely complete daily activities?: Yes Is the patient deaf or have difficulty hearing?: No Does the patient have difficulty seeing, even when wearing glasses/contacts?: No Does the patient have difficulty concentrating, remembering, or making decisions?: Yes Patient able to express need for assistance with ADLs?: No Does the patient have difficulty dressing or bathing?: No Independently performs ADLs?: Yes (appropriate for developmental age) Does the patient have difficulty walking or climbing stairs?: No Weakness of Legs: None Weakness of Arms/Hands: None  Home Assistive Devices/Equipment Home Assistive Devices/Equipment: None  Therapy Consults (therapy consults require a physician order) PT Evaluation Needed: No OT Evalulation Needed: No SLP Evaluation Needed: No Abuse/Neglect Assessment (Assessment to be complete while patient is alone) Physical Abuse: Denies Verbal Abuse: Denies Sexual Abuse: Denies Exploitation of patient/patient's resources: Denies Self-Neglect: Denies Values / Beliefs Cultural Requests During Hospitalization: None Spiritual Requests During Hospitalization: None Consults Spiritual Care Consult Needed: No Social Work Consult Needed: No Merchant navy officer (For Healthcare) Does Patient Have a Medical Advance Directive?: No Would patient like information on creating a medical advance directive?: No - Patient declined    Additional Information 1:1 In Past 12 Months?: No CIRT Risk: No Elopement Risk: No Does patient have medical clearance?: Yes     Disposition:  Disposition Initial Assessment Completed for this  Encounter: Yes Disposition of Patient: Inpatient treatment program [facility] per Dr.Pucilowska)Pt RN Vernona Rieger) informed.  Elijah [name] 03/08/2017 3:47 PM

## 2017-03-08 NOTE — ED Notes (Signed)
[facility] notified in regards to pt being more comfortable over there tonight while waiting on room downstairs, states they will look over chart and get back with this RN.  Name and ascom number provided.

## 2017-03-08 NOTE — ED Notes (Signed)
Nausea and vomiting since 11pm. Autistic with ADHD. Roommate states his speech is more slurred than normal. Mother was in to see him earlier today and gave him a 5mg  Ambien and has had problems since then.

## 2017-03-08 NOTE — Telephone Encounter (Signed)
Last seen 12/02/16 

## 2017-03-08 NOTE — ED Notes (Signed)
Patient and patient's mother expressing concerns about admission to behavioral hospital. This RN explained to patient and mother that patient was still voluntary at this time with no known plans to commit patient. ED physician made aware of concerns and will talk with patient and mother about options.

## 2017-03-08 NOTE — ED Notes (Signed)
Pt transferred back to [facility] [address] via w/c accompanied by staff

## 2017-03-08 NOTE — ED Notes (Signed)
Spoke with charge RN, until noted otherwise, pt's mother will be allowed to sit with patient.

## 2017-03-09 ENCOUNTER — Inpatient Hospital Stay
Admission: AD | Admit: 2017-03-09 | Discharge: 2017-03-10 | DRG: 885 | Disposition: A | Discharge status: Home / Self Care | Payer: Medicaid Other | Source: Intra-hospital | Attending: Psychiatry | Admitting: Psychiatry

## 2017-03-09 ENCOUNTER — Telehealth: Payer: Self-pay | Admitting: Family Medicine

## 2017-03-09 DIAGNOSIS — F4323 Adjustment disorder with mixed anxiety and depressed mood: Secondary | ICD-10-CM | POA: Diagnosis present

## 2017-03-09 DIAGNOSIS — R45851 Suicidal ideations: Secondary | ICD-10-CM

## 2017-03-09 DIAGNOSIS — F819 Developmental disorder of scholastic skills, unspecified: Secondary | ICD-10-CM | POA: Diagnosis present

## 2017-03-09 DIAGNOSIS — K219 Gastro-esophageal reflux disease without esophagitis: Secondary | ICD-10-CM | POA: Diagnosis present

## 2017-03-09 DIAGNOSIS — Z79899 Other long term (current) drug therapy: Secondary | ICD-10-CM | POA: Diagnosis not present

## 2017-03-09 DIAGNOSIS — F840 Autistic disorder: Secondary | ICD-10-CM | POA: Diagnosis present

## 2017-03-09 DIAGNOSIS — F79 Unspecified intellectual disabilities: Secondary | ICD-10-CM | POA: Diagnosis present

## 2017-03-09 DIAGNOSIS — Z68.41 Body mass index (BMI) pediatric, 5th percentile to less than 85th percentile for age: Secondary | ICD-10-CM

## 2017-03-09 DIAGNOSIS — G4700 Insomnia, unspecified: Secondary | ICD-10-CM | POA: Diagnosis present

## 2017-03-09 DIAGNOSIS — F909 Attention-deficit hyperactivity disorder, unspecified type: Secondary | ICD-10-CM | POA: Diagnosis present

## 2017-03-09 DIAGNOSIS — F988 Other specified behavioral and emotional disorders with onset usually occurring in childhood and adolescence: Secondary | ICD-10-CM | POA: Diagnosis present

## 2017-03-09 DIAGNOSIS — R440 Auditory hallucinations: Secondary | ICD-10-CM | POA: Diagnosis present

## 2017-03-09 DIAGNOSIS — F4010 Social phobia, unspecified: Secondary | ICD-10-CM | POA: Diagnosis present

## 2017-03-09 DIAGNOSIS — F323 Major depressive disorder, single episode, severe with psychotic features: Principal | ICD-10-CM | POA: Diagnosis present

## 2017-03-09 MED ORDER — TRAZODONE HCL 50 MG PO TABS
150.0000 mg | ORAL_TABLET | Freq: Every day | ORAL | Status: DC
  Administered 2017-03-09: 150 mg via ORAL
  Filled 2017-03-09: qty 1

## 2017-03-09 MED ORDER — FLUTICASONE PROPIONATE 50 MCG/ACT NA SUSP
2.0000 | Freq: Every day | NASAL | Status: DC | PRN
  Filled 2017-03-09: qty 16

## 2017-03-09 MED ORDER — PANTOPRAZOLE SODIUM 40 MG PO TBEC
40.0000 mg | DELAYED_RELEASE_TABLET | Freq: Every day | ORAL | Status: DC
  Administered 2017-03-09 – 2017-03-10 (×2): 40 mg via ORAL
  Filled 2017-03-09 (×2): qty 1

## 2017-03-09 MED ORDER — LORATADINE 10 MG PO TABS
10.0000 mg | ORAL_TABLET | Freq: Every day | ORAL | Status: DC
  Administered 2017-03-09 – 2017-03-10 (×2): 10 mg via ORAL
  Filled 2017-03-09 (×2): qty 1

## 2017-03-09 MED ORDER — MAGNESIUM HYDROXIDE 400 MG/5ML PO SUSP
30.0000 mL | Freq: Every day | ORAL | Status: DC | PRN
Start: 2017-03-09 — End: 2017-03-10

## 2017-03-09 MED ORDER — ALUM & MAG HYDROXIDE-SIMETH 200-200-20 MG/5ML PO SUSP
30.0000 mL | ORAL | Status: DC | PRN
  Administered 2017-03-09: 30 mL via ORAL
  Filled 2017-03-09: qty 30

## 2017-03-09 MED ORDER — ZOLPIDEM TARTRATE 5 MG PO TABS: 10.0000 mg | ORAL_TABLET | Freq: Every day | ORAL | Status: DC

## 2017-03-09 MED ORDER — ACETAMINOPHEN 325 MG PO TABS: 650.0000 mg | ORAL_TABLET | Freq: Four times a day (QID) | ORAL | Status: DC | PRN

## 2017-03-09 NOTE — ED Notes (Signed)
Patient currently resting in bed with eyes closed. No episodes of SOB since moving to [facility]. Will continue to monitor.

## 2017-03-09 NOTE — ED Provider Notes (Signed)
-----------------------------------------   9:11 AM on 03/09/2017 -----------------------------------------   Blood pressure 119/68, pulse 79, temperature 98.4 F (36.9 C), temperature source Oral, resp. rate 20, height 5\' 6"  (1.676 m), weight 104.3 kg (230 lb), SpO2 100 %.  The patient had no acute events since last update. The patient has been seen by psychiatry who recommends inpatient admission once a bed is available.     Minna AntisPaduchowski, Ramond Darnell, MD 03/09/17 [phone]

## 2017-03-09 NOTE — ED Notes (Signed)
Patient in dayroom socializing with peer, watching television

## 2017-03-09 NOTE — H&P (Addendum)
Psychiatric Admission Assessment Adult  [name] Identification: Elijah [name] MRN:  [id number] Date of Evaluation:  03/09/2017 Chief Complaint:  alttsim ADHA Principal Diagnosis: Major depressive disorder, single episode, severe with psychotic features ([place]) Diagnosis:   [name] Active Problem List   Diagnosis Date Noted  . GERD (gastroesophageal reflux disease) [K21.9] 03/09/2017  . Suicidal ideation [R45.851] 03/09/2017  . Major depressive disorder, single episode, severe with psychotic features ([place]) [F32.3] 03/09/2017  . Autism spectrum disorder [F84.0] 03/08/2017  . Intellectual disability [F79] 11/04/2016  . Aggressive behavior [R45.89]   . Auditory hallucinations [R44.0]   . Learning disability [F81.9] 12/21/2014  . ADD (attention deficit disorder) [F98.8] 11/24/2012  . Morbid obesity ([place]) [E66.01] 11/24/2012   Identifying data. Elijah [name] is a 21 year old male with a history of ADHD and autism.  Chief complaint. "I don't feel good."  History of present illness. Information was obtained from Elijah [name] and Elijah chart. Elijah [name] was brought to Elijah emergency room by his mother for a remarkable change in [name]'s demeanor. Elijah [name] usually is talkative and upbeat but Elijah [name] has not been well for Elijah past 2 weeks complaining of severe insomnia, depression, paranoia, auditory hallucinations, and crying spells. Elijah [name] also on multiple occasions expressed hopelessness and suicidal thinking without plan. His condition deteriorated in Elijah last 24 hours. Elijah [name] didn't feel well physically, experienced nausea and vomiting, his speech became slurred, Elijah [name] felt dizzy and unsteady. His mother took him to [facility] but she was told that they would not be able to provide care for Elijah [name]. Elijah [name] was in Elijah emergency room twice in Elijah last 24 hours returning with no improvement. Elijah [name] himself has difficulties describing how Elijah [name] feels and oftentimes refers to his mother to explain. My understanding is that Elijah [name]  has been under considerable stress lately. Elijah [name] out of his parents house and now lives independently with a trusted roommate. There is sickness in Elijah family. Elijah [name] also has been looking for a job but has not been successful. This has been very disappointing. Elijah [name] feels like a burden and a failure. Elijah [name] started ruminating on his conversations with a clerk at Elijah unemployment office. Elijah [name] hears conversations in his head over and over. Elijah [name] also experiences auditory hallucinations believing that people are talking in Elijah driveway. Elijah [name] hears voices "knocking and cracking". Elijah [name] is paranoid about his new neighbors. Elijah [name] feels that people watch him and are talking about him. She denies panic attacks but does have social anxiety. There are no PTSD or OCD symptoms. Elijah [name] does not report any symptoms suggestive of bipolar mania but was at [facility] emergency room in March 2018 where Elijah [name] presented with borderline manic symptoms. Elijah [name] does not use alcohol. Elijah [name] denies illicit substance or prescription pill abuse.  Elijah [name] spend Elijah night in Elijah Emergency Room as a bed in psychiatry was not available. Elijah [name] is still depressed and anxious but no longer wispers and is more able to participate in Elijah interview without his mother around. Elijah [name] no longer endorses auditory hallucinations or suicidal ideation. Elijah [name] is ready to accept medications. Elijah [name] is morbidly obese which limits our options. I spoke with Elijah mother on Elijah phone again. She is very concerned and again underscores Elijah remarkable change in [name]'s demeanor in Elijah past day.  Past psychiatric history. Elijah mother said that Elijah [name] was never hospitalized but there is information in Elijah chart to say that Elijah [name] was hospitalized when younger. Elijah [name] has diagnosis of ADHD and was treated with Ritalin for  a period of time. I don't think Elijah [name] is prescribed Ritalin any longer and does not want to take it. This was prescribed by his primary care physician who has known this [name] or his life. Reportedly  his PCP prescribes clonazepam for depression also. Elijah [name] also sees health care professionals at [facility] but Elijah mother is unsure what medications Elijah [name] is getting there. She is unsure if Elijah [name] is compliant.  Family psychiatric history. Nonreported.  Social history. Elijah [name] graduated from high school. Elijah [name] is disabled and has Medicaid. For Elijah past few weeks Elijah [name] has been living independently with a roommate and looking for a job. Elijah [name] drives a car. Elijah [name] has supportive family.  Total Time spent with [name]: 1 hour  Is Elijah [name] at risk to self? Yes.    Has Elijah [name] been a risk to self in Elijah past 6 months? No.  Has Elijah [name] been a risk to self within Elijah distant past? No.  Is Elijah [name] a risk to others? No.  Has Elijah [name] been a risk to others in Elijah past 6 months? No.  Has Elijah [name] been a risk to others within Elijah distant past? No.   Prior Inpatient Therapy:   Prior Outpatient Therapy:    Alcohol Screening: 1. How often do you have a drink containing alcohol?: Never 9. Have you or someone else been injured as a result of your drinking?: No 10. Has a relative or friend or a doctor or another health worker been concerned about your drinking or suggested you cut down?: No Alcohol Use Disorder Identification Test Final Score (AUDIT): 0 Brief Intervention: AUDIT score less than 7 or less-screening does not suggest unhealthy drinking-brief intervention not indicated Substance Abuse History in Elijah last 12 months:  No. Consequences of Substance Abuse: NA Previous Psychotropic Medications: Yes  Psychological Evaluations: No  Past Medical History:  Past Medical History:  Diagnosis Date  . Acid reflux   . ADD (attention deficit disorder)   . ADHD (attention deficit hyperactivity disorder)   . Autism   . Learning disability    History reviewed. No pertinent surgical history. Family History: History reviewed. No pertinent family history.  Tobacco Screening: Have you used any form of  tobacco in Elijah last 30 days? (Cigarettes, Smokeless Tobacco, Cigars, and/or Pipes): No Social History:  History  Alcohol Use  . Yes    Comment: occassional     History  Drug Use No    Additional Social History:                           Allergies:  No Known Allergies Lab Results:  Results for orders placed or performed during Elijah hospital encounter of 03/08/17 (from Elijah past 48 hour(s))  Comprehensive metabolic panel     Status: Abnormal   Collection Time: 03/08/17 12:07 PM  Result Value Ref Range   Sodium 137 135 - 145 mmol/L   Potassium 4.0 3.5 - 5.1 mmol/L   Chloride 102 101 - 111 mmol/L   CO2 24 22 - 32 mmol/L   Glucose, Bld 157 (H) 65 - 99 mg/dL   BUN 8 6 - 20 mg/dL   Creatinine, Ser 0.80 0.61 - 1.24 mg/dL   Calcium 9.6 8.9 - 10.3 mg/dL   Total Protein 8.2 (H) 6.5 - 8.1 g/dL   Albumin 4.7 3.5 - 5.0 g/dL   AST 31 15 - 41 U/L   ALT 23 17 - 63 U/L  Alkaline Phosphatase 37 (L) 38 - 126 U/L   Total Bilirubin 0.5 0.3 - 1.2 mg/dL   GFR calc non Af Amer >60 >60 mL/min   GFR calc Af Amer >60 >60 mL/min    Comment: (NOTE) Elijah eGFR has been calculated using Elijah CKD EPI equation. This calculation has not been validated in all clinical situations. eGFR's persistently <60 mL/min signify possible Chronic Kidney Disease.    Anion gap 11 5 - 15  Ethanol     Status: None   Collection Time: 03/08/17 12:07 PM  Result Value Ref Range   Alcohol, Ethyl (B) <5 <5 mg/dL    Comment:        LOWEST DETECTABLE LIMIT FOR SERUM ALCOHOL IS 5 mg/dL FOR MEDICAL PURPOSES ONLY   Salicylate level     Status: None   Collection Time: 03/08/17 12:07 PM  Result Value Ref Range   Salicylate Lvl <0.9 2.8 - 30.0 mg/dL  Acetaminophen level     Status: Abnormal   Collection Time: 03/08/17 12:07 PM  Result Value Ref Range   Acetaminophen (Tylenol), Serum <10 (L) 10 - 30 ug/mL    Comment:        THERAPEUTIC CONCENTRATIONS VARY SIGNIFICANTLY. A RANGE OF 10-30 ug/mL MAY BE AN  EFFECTIVE CONCENTRATION FOR MANY PATIENTS. HOWEVER, SOME ARE BEST TREATED AT CONCENTRATIONS OUTSIDE THIS RANGE. ACETAMINOPHEN CONCENTRATIONS >150 ug/mL AT 4 HOURS AFTER INGESTION AND >50 ug/mL AT 12 HOURS AFTER INGESTION ARE OFTEN ASSOCIATED WITH TOXIC REACTIONS.   cbc     Status: None   Collection Time: 03/08/17 12:07 PM  Result Value Ref Range   WBC 8.8 3.8 - 10.6 K/uL   RBC 5.21 4.40 - 5.90 MIL/uL   Hemoglobin 14.1 13.0 - 18.0 g/dL   HCT 42.8 40.0 - 52.0 %   MCV 82.3 80.0 - 100.0 fL   MCH 27.1 26.0 - 34.0 pg   MCHC 32.9 32.0 - 36.0 g/dL   RDW 14.4 11.5 - 14.5 %   Platelets 255 150 - 440 K/uL  Urine Drug Screen, Qualitative     Status: Abnormal   Collection Time: 03/08/17 12:07 PM  Result Value Ref Range   Tricyclic, Ur Screen NONE DETECTED NONE DETECTED   Amphetamines, Ur Screen NONE DETECTED NONE DETECTED   MDMA (Ecstasy)Ur Screen NONE DETECTED NONE DETECTED   Cocaine Metabolite,Ur [place] NONE DETECTED NONE DETECTED   Opiate, Ur Screen NONE DETECTED NONE DETECTED   Phencyclidine (PCP) Ur S NONE DETECTED NONE DETECTED   Cannabinoid 50 Ng, Ur [place] NONE DETECTED NONE DETECTED   Barbiturates, Ur Screen NONE DETECTED NONE DETECTED   Benzodiazepine, Ur Scrn POSITIVE (A) NONE DETECTED   Methadone Scn, Ur NONE DETECTED NONE DETECTED    Comment: (NOTE) 983  Tricyclics, urine               Cutoff 1000 ng/mL 200  Amphetamines, urine             Cutoff 1000 ng/mL 300  MDMA (Ecstasy), urine           Cutoff 500 ng/mL 400  Cocaine Metabolite, urine       Cutoff 300 ng/mL 500  Opiate, urine                   Cutoff 300 ng/mL 600  Phencyclidine (PCP), urine      Cutoff 25 ng/mL 700  Cannabinoid, urine              Cutoff 50 ng/mL  800  Barbiturates, urine             Cutoff 200 ng/mL 900  Benzodiazepine, urine           Cutoff 200 ng/mL 1000 Methadone, urine                Cutoff 300 ng/mL 1100 1200 Elijah urine drug screen provides only a preliminary, unconfirmed 1300 analytical test  result and should not be used for non-medical 1400 purposes. Clinical consideration and professional judgment should 1500 be applied to any positive drug screen result due to possible 1600 interfering substances. A more specific alternate chemical method 1700 must be used in order to obtain a confirmed analytical result.  1800 Gas chromato graphy / mass spectrometry (GC/MS) is Elijah preferred 1900 confirmatory method.     Blood Alcohol level:  Lab Results  Component Value Date   ETH <5 03/08/2017   ETH <5 36/62/9476    Metabolic Disorder Labs:  No results found for: HGBA1C, MPG No results found for: PROLACTIN No results found for: CHOL, TRIG, HDL, CHOLHDL, VLDL, LDLCALC  Current Medications: Current Facility-Administered Medications  Medication Dose Route Frequency Provider Last Rate Last Dose  . acetaminophen (TYLENOL) tablet 650 mg  650 mg Oral Q6H PRN Laniesha Das B, MD      . alum & mag hydroxide-simeth (MAALOX/MYLANTA) 200-200-20 MG/5ML suspension 30 mL  30 mL Oral Q4H PRN Janeen Watson B, MD   30 mL at 03/09/17 1841  . fluticasone (FLONASE) 50 MCG/ACT nasal spray 2 spray  2 spray Each Nare Daily PRN Maddoxx Burkitt B, MD      . loratadine (CLARITIN) tablet 10 mg  10 mg Oral Daily Aqsa Sensabaugh B, MD   10 mg at 03/09/17 1840  . magnesium hydroxide (MILK OF MAGNESIA) suspension 30 mL  30 mL Oral Daily PRN Sharnette Kitamura B, MD      . pantoprazole (PROTONIX) EC tablet 40 mg  40 mg Oral Daily Kahari Critzer B, MD   40 mg at 03/09/17 1840  . traZODone (DESYREL) tablet 150 mg  150 mg Oral QHS Sahiba Granholm B, MD       PTA Medications: Prescriptions Prior to Admission  Medication Sig Dispense Refill Last Dose  . clonazePAM (KLONOPIN) 0.5 MG tablet Take 1 tablet (0.5 mg total) by mouth at bedtime. As needed for sleep 30 tablet 0 unknown at unknown  . fluticasone (FLONASE) 50 MCG/ACT nasal spray Place 2 sprays into both nostrils daily. Prn head  congestion 16 g 5 unknown at unknown  . loratadine (CLARITIN) 10 MG tablet Take 1 tablet (10 mg total) by mouth daily. 30 tablet 0 prn at prn  . pantoprazole (PROTONIX) 40 MG tablet Take 1 tablet (40 mg total) by mouth daily. 30 tablet 3 unknown at unknown  . traZODone (DESYREL) 150 MG tablet Take 150 mg by mouth at bedtime.   unknown at unknown    Musculoskeletal: Strength & Muscle Tone: within normal limits Gait & Station: normal [name] leans: N/A  Psychiatric Specialty Exam: I reviewed physical examination performed in Elijah ER and agree with Elijah findings. Physical Exam  Nursing note and vitals reviewed. Psychiatric: His mood appears anxious. His affect is blunt. His speech is delayed. Elijah [name] is slowed and withdrawn. Thought content is paranoid. Cognition and memory are normal. Elijah [name] expresses impulsivity. Elijah [name] exhibits a depressed mood. Elijah [name] expresses suicidal ideation.    Review of Systems  Psychiatric/Behavioral: Positive for depression, hallucinations and suicidal ideas. Elijah [name] is  nervous/anxious.   All other systems reviewed and are negative.   Blood pressure (!) 145/88, pulse (!) 110, temperature 98.6 F (37 C), temperature source Oral, resp. rate 18, height '5\' 5"'$  (1.651 m), weight 104.3 kg (230 lb), SpO2 99 %.Body mass index is 38.27 kg/m.  See SRA.                                                  Sleep:       Treatment Plan Summary: Daily contact with [name] to assess and evaluate symptoms and progress in treatment and Medication management   Mr. Southart is a 21 year old male with a history fo anxiety, ADD and auism spectrum disorder admidded for a sudden onset of depression, insomnia, hallucinations and suicidal ideation.  1. Suicidal ideation. Elijah [name] is able to contract for safety in Elijah hospital.  2. Mood. We will start [place] for depression, psychosis and mood stabilization.  3. Insomnia. Trazodone is available.  4. Metabolic syndrome  monitoring. Lipid panel, TSH and HgbA1C are pending.  5. EKG. Pending.  6. Head CT performed in Elijah ER was unremarkable.   7. ADD. Elijah [name] no longer takes stimulants.  8. Anxiety. We will avoid benzodiazepines prescribed by his primary provider.  9. Social. We will refer to "[facility]" in [place].  10. Disposition. Elijah [name] will be discharged to his apartment. Elijah [name] will follow up with [facility].   Observation Level/Precautions:  15 minute checks  Laboratory:  CBC Chemistry Profile UDS UA  Psychotherapy:    Medications:    Consultations:    Discharge Concerns:    Estimated LOS:  Other:     Physician Treatment Plan for Primary Diagnosis: Major depressive disorder, single episode, severe with psychotic features ([place]) Long Term Goal(s): Improvement in symptoms so as ready for discharge  Short Term Goals: Ability to identify changes in lifestyle to reduce recurrence of condition will improve, Ability to verbalize feelings will improve, Ability to disclose and discuss suicidal ideas, Ability to demonstrate self-control will improve, Ability to identify and develop effective coping behaviors will improve, Ability to maintain clinical measurements within normal limits will improve, Compliance with prescribed medications will improve and Ability to identify triggers associated with substance abuse/mental health issues will improve  Physician Treatment Plan for Secondary Diagnosis: Principal Problem:   Major depressive disorder, single episode, severe with psychotic features ([place]) Active Problems:   ADD (attention deficit disorder)   Morbid obesity ([place])   Autism spectrum disorder   Suicidal ideation  Long Term Goal(s): NA  Short Term Goals: NA  I certify that inpatient services furnished can reasonably be expected to improve Elijah [name]'s condition.    Orson Slick, MD 8/16/20187:56 PM

## 2017-03-09 NOTE — ED Notes (Signed)
Patient is resting comfortably. 

## 2017-03-09 NOTE — ED Notes (Signed)
Pt calm and cooperative in dayroom socializing with peers in no distress

## 2017-03-09 NOTE — ED Notes (Signed)
Pt. Sleeping at this time.

## 2017-03-09 NOTE — BHH Suicide Risk Assessment (Signed)
[facility] Admission Suicide Risk Assessment   Nursing information obtained from:  Patient Demographic factors:  Male, Caucasian, Low socioeconomic status Current Mental Status:    Loss Factors:    Historical Factors:    Risk Reduction Factors:  Living with another person, especially a relative  Total Time spent with patient: 1 hour Principal Problem: Major depressive disorder, single episode, severe with psychotic features ([facility]) Diagnosis:   Patient Active Problem List   Diagnosis Date Noted  . GERD (gastroesophageal reflux disease) [K21.9] 03/09/2017  . Suicidal ideation [R45.851] 03/09/2017  . Major depressive disorder, single episode, severe with psychotic features ([facility]) [F32.3] 03/09/2017  . Autism spectrum disorder [F84.0] 03/08/2017  . Intellectual disability [F79] 11/04/2016  . Aggressive behavior [R45.89]   . Auditory hallucinations [R44.0]   . Learning disability [F81.9] 12/21/2014  . ADD (attention deficit disorder) [F98.8] 11/24/2012  . Morbid obesity ([facility]) [E66.01] 11/24/2012   Subjective Data: suicidal ideation.  Continued Clinical Symptoms:  Alcohol Use Disorder Identification Test Final Score (AUDIT): 0 The "Alcohol Use Disorders Identification Test", Guidelines for Use in Primary Care, Second Edition.  World Science writer [facility]). Score between 0-7:  no or low risk or alcohol related problems. Score between 8-15:  moderate risk of alcohol related problems. Score between 16-19:  high risk of alcohol related problems. Score 20 or above:  warrants further diagnostic evaluation for alcohol dependence and treatment.   CLINICAL FACTORS:   Severe Anxiety and/or Agitation Depression:   Hopelessness Insomnia Recent sense of peace/wellbeing   Musculoskeletal: Strength & Muscle Tone: within normal limits Gait & Station: normal Patient leans: N/A  Psychiatric Specialty Exam: Physical Exam  Nursing note and vitals reviewed. Psychiatric: His mood appears anxious.  His affect is blunt. His speech is delayed. He is slowed, withdrawn and actively hallucinating. Thought content is paranoid. Cognition and memory are normal. He expresses impulsivity. He exhibits a depressed mood. He expresses suicidal ideation.    Review of Systems  Psychiatric/Behavioral: Positive for depression, hallucinations and suicidal ideas. The patient is nervous/anxious and has insomnia.   All other systems reviewed and are negative.   Blood pressure (!) 145/88, pulse (!) 110, temperature 98.6 F (37 C), temperature source Oral, resp. rate 18, height 5\' 5"  (1.651 m), weight 104.3 kg (230 lb), SpO2 99 %.Body mass index is 38.27 kg/m.  General Appearance: Casual  Eye Contact:  Fair  Speech:  Slow  Volume:  Decreased  Mood:  Anxious, Depressed and Hopeless  Affect:  Blunt  Thought Process:  Goal Directed and Descriptions of Associations: Intact  Orientation:  Full (Time, Place, and Person)  Thought Content:  Hallucinations: Auditory and Paranoid Ideation  Suicidal Thoughts:  Yes.  with intent/plan  Homicidal Thoughts:  No  Memory:  Immediate;   Fair Recent;   Fair Remote;   Fair  Judgement:  Fair  Insight:  Shallow  Psychomotor Activity:  Psychomotor Retardation  Concentration:  Concentration: Fair and Attention Span: Fair  Recall:  [employer] of Knowledge:  Fair  Language:  Fair  Akathisia:  No  Handed:  Right  AIMS (if indicated):     Assets:  Communication Skills Desire for Improvement Financial Resources/Insurance Housing Physical Health Resilience Social Support  ADL's:  Intact  Cognition:  WNL  Sleep:         COGNITIVE FEATURES THAT CONTRIBUTE TO RISK:  None    SUICIDE RISK:   Severe:  Frequent, intense, and enduring suicidal ideation, specific plan, no subjective intent, but some objective markers  of intent (i.e., choice of lethal method), the method is accessible, some limited preparatory behavior, evidence of impaired self-control, severe  dysphoria/symptomatology, multiple risk factors present, and few if any protective factors, particularly a lack of social support.  PLAN OF CARE: hospital admission, medication management, discharge planning.  Mr. Angelique HolmSouthart is a 21 year old male with a history fo anxiety, ADD and auism spectrum disorder admidded for a sudden onset of depression, insomnia, hallucinations and suicidal ideation.  1. Suicidal ideation. The patient is able to contract for safety in the hospital.  2. Mood. We will start [place] for depression, psychosis and mood stabilization.  3. Insomnia. Trazodone is available.  4. Metabolic syndrome monitoring. Lipid panel, TSH and HgbA1C are pending.  5. EKG. Pending.  6. ADD. The patient no longer takes stimulants.  7. Anxiety. We will avoid benzodiazepines prescribed by his primary provider.  8. Social. We will refer to "[employer] in [place].  9. Disposition. He will be discharged to his apartment. [facility]   7.   I certify that inpatient services furnished can reasonably be expected to improve the patient's condition.   Kristine LineaJolanta Kylar Speelman, MD 03/09/2017, 7:56 PM

## 2017-03-09 NOTE — BH Assessment (Signed)
BMU bed assignment pending discharges.

## 2017-03-09 NOTE — Telephone Encounter (Signed)
Mom-Samantha wanted you to know patient is in [facility] for [facility]. He was attend yesterday. And mom wants to speak with you.
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
## 2017-03-09 NOTE — BH Assessment (Signed)
[facility] by Dr. Jennet MaduroPucilowska.  Attending Physician will be Dr. Jennet MaduroPucilowska.   Patient has been [facility] to room 304, [facility] Phyillis.   ER staff is aware of the admission ( Emilie, ER Sect.; Marylu LundJanet,  Patient's Nurse & Mertie ClauseJeanelle Patient Access).  EDP Dr.Williams also informed.

## 2017-03-09 NOTE — ED Notes (Signed)
Pt. To BHU from ED ambulatory without difficulty, to room  BHU 2. [facility]. Pt. Is alert and oriented, warm and dry in no distress. Pt. Denies SI, HI, and AVH. Pt. Calm and cooperative. Pt. Made aware of security cameras and Q15 minute rounds. Pt. Encouraged to let Nursing staff know of any concerns or needs.

## 2017-03-09 NOTE — Tx Team (Signed)
Initial Treatment Plan 03/09/2017 6:15 PM Elijah [name] Klabunde ONG:[id number]    PATIENT STRESSORS: Other: lonely    PATIENT STRENGTHS: Ability for insight Capable of independent living Communication skills General fund of knowledge Motivation for treatment/growth    PATIENT IDENTIFIED PROBLEMS: Depression   Anxiety/stress                   DISCHARGE CRITERIA:  Adequate post-discharge living arrangements Improved stabilization in mood, thinking, and/or behavior  PRELIMINARY DISCHARGE PLAN: Attend aftercare/continuing care group  PATIENT/FAMILY INVOLVEMENT: This treatment plan has been presented to and reviewed with the patient, Elijah [name] Wickham, and/or family member, .  The patient and family have been given the opportunity to ask questions and make suggestions.  Thalia Bloodgoodaikencia  Tangala Wiegert, RN 03/09/2017, 6:15 PM

## 2017-03-09 NOTE — ED Notes (Signed)
Watching television in dayroom with peers. In no distress. Educated patient on inpatient behavioral health unit.

## 2017-03-10 ENCOUNTER — Ambulatory Visit: Payer: Medicaid Other | Admitting: Nurse Practitioner

## 2017-03-10 LAB — HEMOGLOBIN A1C
HEMOGLOBIN A1C: 6.1 % — AB (ref 4.8–5.6)
Mean Plasma Glucose: 128.37 mg/dL

## 2017-03-10 LAB — LIPID PANEL
Cholesterol: 208 mg/dL — ABNORMAL HIGH (ref 0–200)
HDL: 39 mg/dL — AB (ref >40)
LDL CALC: 131 mg/dL — AB (ref 0–99)
TRIGLYCERIDES: 191 mg/dL — AB (ref <150)
Total CHOL/HDL Ratio: 5.3 RATIO
VLDL: 38 mg/dL (ref 0–40)

## 2017-03-10 LAB — TSH: TSH: 1.401 u[IU]/mL (ref 0.350–4.500)

## 2017-03-10 MED ORDER — PANTOPRAZOLE SODIUM 40 MG PO TBEC: 40.0000 mg | DELAYED_RELEASE_TABLET | Freq: Every day | ORAL | 3 refills | Status: DC

## 2017-03-10 MED ORDER — LURASIDONE HCL 40 MG PO TABS: 40.0000 mg | ORAL_TABLET | Freq: Every day | ORAL | 1 refills | Status: DC

## 2017-03-10 MED ORDER — TRAZODONE HCL 150 MG PO TABS
150.0000 mg | ORAL_TABLET | Freq: Every day | ORAL | 1 refills | Status: DC
Start: 2017-03-10 — End: 2017-06-19

## 2017-03-10 MED ORDER — LURASIDONE HCL 40 MG PO TABS: 40.0000 mg | ORAL_TABLET | Freq: Every day | ORAL | Status: DC

## 2017-03-10 NOTE — [facility] Suicide Risk Assessment (Signed)
[facility] INPATIENT:  Family/Significant Other Suicide Prevention Education  Suicide Prevention Education:  Education Completed; Elijah [name](mother [phone]), has been identified by the patient as the family member/significant other with whom the patient will be residing, and identified as the person(s) who will aid the patient in the event of a mental health crisis (suicidal ideations/suicide attempt).  With written consent from the patient, the family member/significant other has been provided the following suicide prevention education, prior to the and/or following the discharge of the patient.  The suicide prevention education provided includes the following:  Suicide risk factors  Suicide prevention and interventions  National Suicide Hotline telephone number  [facility] assessment telephone number  [facility] Emergency Assistance 911  [facility] and/or Residential Mobile Crisis Unit telephone number  Request made of family/significant other to:  Remove weapons (e.g., guns, rifles, knives), all items previously/currently identified as safety concern.    Remove drugs/medications (over-the-counter, prescriptions, illicit drugs), all items previously/currently identified as a safety concern.  The family member/significant other verbalizes understanding of the suicide prevention education information provided.  The family member/significant other agrees to remove the items of safety concern listed above.  Elijah [name] G. Elijah [name] MSW, LCSWA 03/10/2017, 10:25 AM

## 2017-03-10 NOTE — Progress Notes (Signed)
Patient ID: Elijah [name], male   DOB: Jun 13, 1996, 21 y.o.   MRN: [id number] Initially needy, c/o bleeding from mid right of abdomen from scratching self; Band-Aid applied after area cleaned off; disappointed that he his not going home, "my mother brought me here and the doctor told me I can go home...Marland KitchenMarland Kitchen" Patient washed up and changed into street clothes, requested for early medications, Trazodone 150 mg given at 2024; A&Ox3, denied pain, pleasant, polite, getting along with others.

## 2017-03-10 NOTE — [facility] Group Notes (Signed)
[facility] Group Notes:  (Nursing/MHT/Case Management/Adjunct)  Date:  03/10/2017  Time:  5:15 AM  Type of Therapy:  Psychoeducational Skills  Participation Level:  Active  Participation Quality:  Appropriate  Affect:  Appropriate  Cognitive:  Appropriate  Insight:  Appropriate and Good  Engagement in Group:  Engaged  Modes of Intervention:  Discussion, Socialization and Support  Summary of Progress/Problems:  Elijah [name] 03/10/2017, 5:15 AM

## 2017-03-10 NOTE — BHH Suicide Risk Assessment (Signed)
[facility] Discharge Suicide Risk Assessment   Principal Problem: Major depressive disorder, single episode, severe with psychotic features [facility]) Discharge Diagnoses:  Patient Active Problem List   Diagnosis Date Noted  . GERD (gastroesophageal reflux disease) [K21.9] 03/09/2017  . Suicidal ideation [R45.851] 03/09/2017  . Major depressive disorder, single episode, severe with psychotic features (HCC) [F32.3] 03/09/2017  . Autism spectrum disorder [F84.0] 03/08/2017  . Intellectual disability [F79] 11/04/2016  . Aggressive behavior [R45.89]   . Auditory hallucinations [R44.0]   . Learning disability [F81.9] 12/21/2014  . ADD (attention deficit disorder) [F98.8] 11/24/2012  . Morbid obesity (HCC) [E66.01] 11/24/2012    Total Time spent with patient: 30 minutes  Musculoskeletal: Strength & Muscle Tone: within normal limits Gait & Station: normal Patient leans: N/A  Psychiatric Specialty Exam: Review of Systems  Psychiatric/Behavioral: The patient is nervous/anxious.   All other systems reviewed and are negative.   Blood pressure 116/63, pulse 92, temperature 98.3 F (36.8 C), temperature source Oral, resp. rate 18, height 5\' 5"  (1.651 m), weight 104.3 kg (230 lb), SpO2 99 %.Body mass index is 38.27 kg/m.  General Appearance: Casual  Eye Contact::  Good  Speech:  Clear and Coherent409  Volume:  Normal  Mood:  Anxious  Affect:  Appropriate  Thought Process:  Goal Directed and Descriptions of Associations: Intact  Orientation:  Full (Time, Place, and Person)  Thought Content:  WDL  Suicidal Thoughts:  No  Homicidal Thoughts:  No  Memory:  Immediate;   Fair Recent;   Fair Remote;   Fair  Judgement:  Impaired  Insight:  Shallow  Psychomotor Activity:  Normal  Concentration:  Fair  Recall:  [employer] of Knowledge:Fair  Language: Fair  Akathisia:  No  Handed:  Right  AIMS (if indicated):     Assets:  Communication Skills Desire for Improvement Financial  Resources/Insurance Housing Physical Health Resilience Social Support Transportation  Sleep:  Number of Hours: 7.3  Cognition: WNL  ADL's:  Intact   Mental Status Per Nursing Assessment::   On Admission:     Demographic Factors:  Male, Adolescent or young adult, Caucasian and Unemployed  Loss Factors: Financial problems/change in socioeconomic status  Historical Factors: Impulsivity  Risk Reduction Factors:   Sense of responsibility to family, Living with another person, especially a relative, Positive social support and Positive therapeutic relationship  Continued Clinical Symptoms:  Depression:   Impulsivity Previous Psychiatric Diagnoses and Treatments  Cognitive Features That Contribute To Risk:  None    Suicide Risk:  Minimal: No identifiable suicidal ideation.  Patients presenting with no risk factors but with morbid ruminations; may be classified as minimal risk based on the severity of the depressive symptoms    Plan Of Care/Follow-up recommendations:  Activity:  as tolerated. Diet:  lowmsodium heart healthy. Other:  keep follow up appointments.  Kristine Linea, MD 03/10/2017, 9:46 AM

## 2017-03-10 NOTE — Progress Notes (Signed)
  [facility] Adult Case Management Discharge Plan :  Will you be returning to the same living situation after discharge:  Yes,  to his apartment At discharge, do you have transportation home?: Yes,  mother Do you have the ability to pay for your medications: Yes,  patient has insurance.  Release of information consent forms completed and in the chart;  Patient's signature needed at discharge.  Patient to Follow up at: Follow-up Information    [facility] on 03/14/2017.   Why:  Follow-up appointment with your provider Mallie Snooks at 10:40am on 03/14/2017. Follow-up appointment with your therapist Joni Riffey at 03/20/2017 11:00am. Bring discharge summary with you to this appointment.  Contact information: [address] [place] [address] [phone]           Next level of care provider has access to [facility] Link:no  Safety Planning and Suicide Prevention discussed: Yes,  with patient and mother.   Have you used any form of tobacco in the last 30 days? (Cigarettes, Smokeless Tobacco, Cigars, and/or Pipes): No  Has patient been referred to the Quitline?: N/A patient is not a smoker  Patient has been referred for addiction treatment: N/A  Arelia Longest, LCSWA 03/10/2017, 11:42 AM

## 2017-03-10 NOTE — Progress Notes (Signed)
Recreation Therapy Notes  Date: 08.17.18 Time: 9:30 am Location: Craft Room  Group Topic: Coping Skills  Goal Area(s) Addresses:  Patient will verbalize benefit of using art as a coping skill. Patient will verbalize one emotion experienced in group.  Behavioral Response: Attentive, Left early  Intervention: Coloring  Activity: Patients were give coloring sheets to color and were instructed to think about the emotions they were feeling as well as what their minds were focused on.  Education: LRT educated patients on healthy coping skills.  Education Outcome: Patient left before LRT educated group.   Clinical Observations/Feedback: Patient intermittently colored coloring sheet. Patient left group at approximately 10:08 am with social work and did not return to group.  Jacquelynn Cree, LRT/CTRS 03/10/2017 10:19 AM

## 2017-03-10 NOTE — Progress Notes (Signed)
Patient discharged home. DC instructions provided and explained. Medications reviewed. Rx given, all questions answered. Belongings returned. Denies SI, HI, AVH. AVS, transition and risk assessment given.

## 2017-03-10 NOTE — Tx Team (Signed)
Interdisciplinary Treatment and Diagnostic Plan Update  03/10/2017 Time of Session: 10:30am Elijah [name] MRN: [id number]  Principal Diagnosis: Major depressive disorder, single episode, severe with psychotic features [facility])  Secondary Diagnoses: Principal Problem:   Major depressive disorder, single episode, severe with psychotic features (HCC) Active Problems:   ADD (attention deficit disorder)   Morbid obesity (HCC)   Autism spectrum disorder   Suicidal ideation   Current Medications:  Current Facility-Administered Medications  Medication Dose Route Frequency Provider Last Rate Last Dose  . acetaminophen (TYLENOL) tablet 650 mg  650 mg Oral Q6H PRN Pucilowska, Jolanta B, MD      . alum & mag hydroxide-simeth (MAALOX/MYLANTA) 200-200-20 MG/5ML suspension 30 mL  30 mL Oral Q4H PRN Pucilowska, Jolanta B, MD   30 mL at 03/09/17 1841  . fluticasone (FLONASE) 50 MCG/ACT nasal spray 2 spray  2 spray Each Nare Daily PRN Pucilowska, Jolanta B, MD      . loratadine (CLARITIN) tablet 10 mg  10 mg Oral Daily Pucilowska, Jolanta B, MD   10 mg at 03/10/17 0823  . lurasidone (LATUDA) tablet 40 mg  40 mg Oral Q supper Pucilowska, Jolanta B, MD      . magnesium hydroxide (MILK OF MAGNESIA) suspension 30 mL  30 mL Oral Daily PRN Pucilowska, Jolanta B, MD      . pantoprazole (PROTONIX) EC tablet 40 mg  40 mg Oral Daily Pucilowska, Jolanta B, MD   40 mg at 03/10/17 0823  . traZODone (DESYREL) tablet 150 mg  150 mg Oral QHS Pucilowska, Jolanta B, MD   150 mg at 03/09/17 2024   PTA Medications: Prescriptions Prior to Admission  Medication Sig Dispense Refill Last Dose  . clonazePAM (KLONOPIN) 0.5 MG tablet Take 1 tablet (0.5 mg total) by mouth at bedtime. As needed for sleep 30 tablet 0 unknown at unknown  . fluticasone (FLONASE) 50 MCG/ACT nasal spray Place 2 sprays into both nostrils daily. Prn head congestion 16 g 5 unknown at unknown  . loratadine (CLARITIN) 10 MG tablet Take 1 tablet (10 mg  total) by mouth daily. 30 tablet 0 prn at prn  . pantoprazole (PROTONIX) 40 MG tablet Take 1 tablet (40 mg total) by mouth daily. 30 tablet 3 unknown at unknown  . [DISCONTINUED] traZODone (DESYREL) 150 MG tablet Take 150 mg by mouth at bedtime.   unknown at unknown    Patient Stressors: Other: lonely   Patient Strengths: Ability for insight Capable of independent living Communication skills General fund of knowledge Motivation for treatment/growth  Treatment Modalities: Medication Management, Group therapy, Case management,  1 to 1 session with clinician, Psychoeducation, Recreational therapy.   Physician Treatment Plan for Primary Diagnosis: Major depressive disorder, single episode, severe with psychotic features (HCC) Long Term Goal(s): Improvement in symptoms so as ready for discharge NA   Short Term Goals: Ability to identify changes in lifestyle to reduce recurrence of condition will improve Ability to verbalize feelings will improve Ability to disclose and discuss suicidal ideas Ability to demonstrate self-control will improve Ability to identify and develop effective coping behaviors will improve Ability to maintain clinical measurements within normal limits will improve Compliance with prescribed medications will improve Ability to identify triggers associated with substance abuse/mental health issues will improve NA  Medication Management: Evaluate patient's response, side effects, and tolerance of medication regimen.  Therapeutic Interventions: 1 to 1 sessions, Unit Group sessions and Medication administration.  Evaluation of Outcomes: Adequate for Discharge  Physician Treatment Plan for Secondary Diagnosis:  Principal Problem:   Major depressive disorder, single episode, severe with psychotic features (HCC) Active Problems:   ADD (attention deficit disorder)   Morbid obesity (HCC)   Autism spectrum disorder   Suicidal ideation  Long Term Goal(s): Improvement  in symptoms so as ready for discharge NA   Short Term Goals: Ability to identify changes in lifestyle to reduce recurrence of condition will improve Ability to verbalize feelings will improve Ability to disclose and discuss suicidal ideas Ability to demonstrate self-control will improve Ability to identify and develop effective coping behaviors will improve Ability to maintain clinical measurements within normal limits will improve Compliance with prescribed medications will improve Ability to identify triggers associated with substance abuse/mental health issues will improve NA     Medication Management: Evaluate patient's response, side effects, and tolerance of medication regimen.  Therapeutic Interventions: 1 to 1 sessions, Unit Group sessions and Medication administration.  Evaluation of Outcomes: Adequate for Discharge   RN Treatment Plan for Primary Diagnosis: Major depressive disorder, single episode, severe with psychotic features (HCC) Long Term Goal(s): Knowledge of disease and therapeutic regimen to maintain health will improve  Short Term Goals: Ability to participate in decision making will improve and Ability to verbalize feelings will improve  Medication Management: RN will administer medications as ordered by provider, will assess and evaluate patient's response and provide education to patient for prescribed medication. RN will report any adverse and/or side effects to prescribing provider.  Therapeutic Interventions: 1 on 1 counseling sessions, Psychoeducation, Medication administration, Evaluate responses to treatment, Monitor vital signs and CBGs as ordered, Perform/monitor CIWA, COWS, AIMS and Fall Risk screenings as ordered, Perform wound care treatments as ordered.  Evaluation of Outcomes: Adequate for Discharge   LCSW Treatment Plan for Primary Diagnosis: Major depressive disorder, single episode, severe with psychotic features (HCC) Long Term Goal(s): Safe  transition to appropriate next level of care at discharge, Engage patient in therapeutic group addressing interpersonal concerns.  Short Term Goals: Engage patient in aftercare planning with referrals and resources, Increase ability to appropriately verbalize feelings and Increase skills for wellness and recovery  Therapeutic Interventions: Assess for all discharge needs, 1 to 1 time with Social worker, Explore available resources and support systems, Assess for adequacy in community support network, Educate family and significant other(s) on suicide prevention, Complete Psychosocial Assessment, Interpersonal group therapy.  Evaluation of Outcomes: Adequate for Discharge   Progress in Treatment: Attending groups: Yes. Participating in groups: Yes. Taking medication as prescribed: Yes. Toleration medication: Yes. Family/Significant other contact made: Yes, individual(s) contacted:  mother Patient understands diagnosis: Yes. Discussing patient identified problems/goals with staff: Yes. Medical problems stabilized or resolved: Yes. Denies suicidal/homicidal ideation: Yes. Issues/concerns per patient self-inventory: No. Other: n/a  New problem(s) identified: None identified at this time.   New Short Term/Long Term Goal(s): "I feel better now".   Discharge Plan or Barriers: Patient will discharge home and continue services with [facility].   Reason for Continuation of Hospitalization: Anticipated discharge 03/10/2017  Estimated Length of Stay: Anticipated discharge 03/10/2017  Attendees: Patient: Elijah [name] 03/10/2017 10:48 AM  Physician: Dr. Kristine Linea, MD 03/10/2017 10:48 AM  Nursing:  03/10/2017 10:48 AM  RN Care Manager: 03/10/2017 10:48 AM  Social Worker: Fredrich Birks. Garnette Czech MSW, LCSWA 03/10/2017 10:48 AM  Recreational Therapist: Jacquelynn Cree, LRT/CTRS 03/10/2017 10:48 AM  Other:  03/10/2017 10:48 AM  Other:  03/10/2017 10:48 AM  Other: 03/10/2017 10:48 AM     Scribe for Treatment Team: Arelia Longest,  LCSWA 03/10/2017 10:48 AM

## 2017-03-10 NOTE — BHH Counselor (Signed)
Patient is being discharged within 24 hours of discharge. Psychosocial assessment is not required at this time. Patient is an established patient of [facility] and plans to continue services. [facility] Patient consented for CSW to speak with his mother, who will be picking him up at discharge.  Sanaia Jasso G. Garnette Czech MSW, [facility] 03/10/2017 10:34 AM

## 2017-03-10 NOTE — Telephone Encounter (Signed)
I discussed the case with mother. She is going to be talking with the social service worker to see if they can assist with getting some sort of possible supervised her group home living. This patient would have a very difficult time supporting himself and living by himself. He needs his mother's involvement as well as social services

## 2017-03-10 NOTE — Progress Notes (Signed)
Patient ID: Elijah [name], male   DOB: March 08, 1996, 21 y.o.   MRN: [id number]  CSW made referral to Psychotherapeutic services for [facility] day program per request of patient's mother and patient. CSW spoke with Delores from [facility], informed her that referral was being made. Delores informed CSW that she would reach out to patient once discharged to see if he is appropriate and qualifies for services.   Charlayne Vultaggio G. Garnette Czech MSW, [facility] 03/10/2017 11:42 AM

## 2017-03-10 NOTE — Plan of Care (Signed)
Problem: Activity: Goal: Sleeping patterns will improve Outcome: Progressing Patient slept for Estimated Hours of 7.30; Precautionary checks every 15 minutes for safety maintained, room free of safety hazards, patient sustains no injury or falls during this shift.    

## 2017-03-10 NOTE — Discharge Summary (Signed)
Physician Discharge Summary Note  [name]:  Elijah [name] is an 21 y.o., male MRN:  [id number] DOB:  1996-02-02 [name] phone:  [phone] (home)  [name] address:   [address] [place] [address],  Total Time spent with [name]: 30 minutes  Date of Admission:  03/09/2017 Date of Discharge: 03/10/2017  Reason for Admission:  Suicidal ideation.  Identifying data. Elijah [name] is a 21 year old male with a history of ADD and autism.  Chief complaint. "I don't feel good."  History of present illness. Information was obtained from Elijah [name] and Elijah chart. Elijah [name] was brought to Elijah emergency room by his mother for a remarkable change in [name]'s demeanor. He usually is talkative and upbeat but he has not been well for Elijah past 2 weeks complaining of severe insomnia, depression, paranoia, auditory hallucinations, and crying spells. He also on multiple occasions expressed hopelessness and suicidal thinking without plan. His condition deteriorated in Elijah last 24 hours. He didn't feel well physically, experienced nausea and vomiting, his speech became slurred, he felt dizzy and unsteady. His mother took him to [facility] but she was told that they would not be able to provide care for Elijah [name]. He was in Elijah emergency room twice in Elijah last 24 hours returning with no improvement. Elijah [name] himself has difficulties describing how he feels and oftentimes refers to his mother to explain. My understanding is that he has been under considerable stress lately. He out of his parents house and now lives independently with a trusted roommate. There is sickness in Elijah family. Elijah [name] also has been looking for a job but has not been successful. This has been very disappointing. He feels like a burden and a failure. 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 He hears conversations in his head over and over. He also experiences auditory hallucinations believing  that people are talking in Elijah driveway. He hears voices "knocking and cracking". He is paranoid about his new neighbors. He feels that people watch him and are talking about him. She denies panic attacks but does have social anxiety. There are no PTSD or OCD symptoms. He does not report any symptoms suggestive of bipolar mania but was at [facility] emergency room in March 2018 where he presented with borderline manic symptoms. He does not use alcohol. He denies illicit substance or prescription pill abuse.  Elijah [name] spend Elijah night in Elijah Emergency Room as a bed in psychiatry was not available. He is still depressed and anxious but no longer wispers and is more able to participate in Elijah interview without his mother around. He no longer endorses auditory hallucinations or suicidal ideation. He is ready to accept medications. He is morbidly obese which limits our options. I spoke with Elijah mother on Elijah phone again. She is very concerned and again underscores Elijah remarkable change in [name]'s demeanor in Elijah past day.  Past psychiatric history. Elijah mother said that he was never hospitalized but there is information in Elijah chart to say that he was hospitalized when younger. He has diagnosis of ADHD and was treated with Ritalin for a period of time. I don't think he is prescribed Ritalin any longer and does not want to take it. This was prescribed by his primary care physician who has known this [name] or his life. Reportedly his PCP prescribes clonazepam for depression also. He also sees health care professionals at [facility] but Elijah mother is unsure what medications  he is getting there. She is unsure if he is compliant.  Family psychiatric history. Nonreported.  Social history. He graduated from high school. He is disabled and has Medicaid. For Elijah past few weeks he has been living independently with a roommate and looking for a job. He drives a car. He has supportive family.   Principal  Problem: Adjustment disorder with mixed anxiety and depressed mood Discharge Diagnoses: [name] Active Problem List   Diagnosis Date Noted  . GERD (gastroesophageal reflux disease) [K21.9] 03/09/2017  . Suicidal ideation [R45.851] 03/09/2017  . Adjustment disorder with mixed anxiety and depressed mood [F43.23] 03/09/2017  . Autism spectrum disorder [F84.0] 03/08/2017  . Intellectual disability [F79] 11/04/2016  . Aggressive behavior [R45.89]   . Auditory hallucinations [R44.0]   . Learning disability [F81.9] 12/21/2014  . ADD (attention deficit disorder) [F98.8] 11/24/2012  . Morbid obesity (HCC) [E66.01] 11/24/2012    Past Medical History:  Past Medical History:  Diagnosis Date  . Acid reflux   . ADD (attention deficit disorder)   . ADHD (attention deficit hyperactivity disorder)   . Autism   . Learning disability    History reviewed. No pertinent surgical history. Family History: History reviewed. No pertinent family history.  Social History:  History  Alcohol Use  . Yes    Comment: occassional     History  Drug Use No    Social History   Social History  . Marital status: Single    Spouse name: N/A  . Number of children: N/A  . Years of education: N/A   Social History Main Topics  . Smoking status: Never Smoker  . Smokeless tobacco: Never Used  . Alcohol use Yes     Comment: occassional  . Drug use: No  . Sexual activity: Not Asked   Other Topics Concern  . None   Social History Narrative  . None    Hospital Course:    Elijah [name] is a 21 year old male with a history fo anxiety, ADD and autism spectrum disorder admidded for a sudden onset of depression, insomnia, hallucinations and suicidal ideation.  1. Suicidal ideation. Resolved. Elijah [name] adamantly denies any thoughts, intention or plans to hurt himself or others. He is able to contract for safety. He is forward thinking and optimistic about Elijah future. He is a loving son.  2. Mood. Elijah  [name] initially was interested in pharmacotherapy but at Elijah time of discharge he decided against it as his symptoms fully resolved and he was "fine".  3. Insomnia. Trazodone was available.  4. Metabolic syndrome monitoring. Lipid panel and TSH are normal, HgbA1 pending.  5. EKG. Normal sinus rhythm, QTc 413.  6. Head CT performed in Elijah ER was unremarkable.   7. ADD. Elijah [name] no longer takes stimulants.  8. Anxiety. We did not offer benzodiazepines in Elijah hospital.   9. GERD. He is on Protonix.   10. Social. We provided Elijah mother with information about "[employer]" program in [place] that provides employment to persons with autism.   11. Disposition. He was discharged with his mother. Elijah mother requested that his follow up be no longer with [facility] but rather with [facility].   Physical Findings: AIMS: Facial and Oral Movements Muscles of Facial Expression: None, normal Lips and Perioral Area: None, normal Jaw: None, normal Tongue: None, normal,Extremity Movements Upper (arms, wrists, hands, fingers): None, normal Lower (legs, knees, ankles, toes): None, normal, Trunk Movements Neck, shoulders, hips: None, normal, Overall Severity Severity of abnormal  movements (highest score from questions above): None, normal Incapacitation due to abnormal movements: None, normal [name]'s awareness of abnormal movements (rate only [name]'s report): No Awareness, Dental Status Current problems with teeth and/or dentures?: No Does [name] usually wear dentures?: No  CIWA:  CIWA-Ar Total: 0 COWS:     Musculoskeletal: Strength & Muscle Tone: within normal limits Gait & Station: normal [name] leans: N/A  Psychiatric Specialty Exam: Physical Exam  Nursing note and vitals reviewed. Psychiatric: His speech is normal and behavior is normal. Thought content normal. His mood appears anxious. Cognition and memory are normal. He expresses impulsivity.    Review of  Systems  Psychiatric/Behavioral: Elijah [name] is nervous/anxious.   All other systems reviewed and are negative.   Blood pressure 116/63, pulse 92, temperature 98.3 F (36.8 C), temperature source Oral, resp. rate 18, height 5\' 5"  (1.651 m), weight 104.3 kg (230 lb), SpO2 99 %.Body mass index is 38.27 kg/m.  General Appearance: Casual  Eye Contact:  Good  Speech:  Clear and Coherent  Volume:  Normal  Mood:  Anxious  Affect:  Appropriate  Thought Process:  Goal Directed and Descriptions of Associations: Intact  Orientation:  Full (Time, Place, and Person)  Thought Content:  WDL  Suicidal Thoughts:  No  Homicidal Thoughts:  No  Memory:  Immediate;   Fair Recent;   Fair Remote;   Fair  Judgement:  Impaired  Insight:  Shallow  Psychomotor Activity:  Normal  Concentration:  Concentration: Fair and Attention Span: Fair  Recall:  [employer] of Knowledge:  Fair  Language:  Fair  Akathisia:  No  Handed:  Right  AIMS (if indicated):     Assets:  Communication Skills Desire for Improvement Financial Resources/Insurance Housing Physical Health Resilience Social Support Transportation  ADL's:  Intact  Cognition:  WNL  Sleep:  Number of Hours: 7.3     Have you used any form of tobacco in Elijah last 30 days? (Cigarettes, Smokeless Tobacco, Cigars, and/or Pipes): No  Has this [name] used any form of tobacco in Elijah last 30 days? (Cigarettes, Smokeless Tobacco, Cigars, and/or Pipes) Yes, No  Blood Alcohol level:  Lab Results  Component Value Date   ETH <5 03/08/2017   ETH <5 10/06/2016    Metabolic Disorder Labs:  No results found for: HGBA1C, MPG No results found for: PROLACTIN Lab Results  Component Value Date   CHOL 208 (H) 03/10/2017   TRIG 191 (H) 03/10/2017   HDL 39 (L) 03/10/2017   CHOLHDL 5.3 03/10/2017   VLDL 38 03/10/2017   LDLCALC 131 (H) 03/10/2017    See Psychiatric Specialty Exam and Suicide Risk Assessment completed by Attending Physician prior to  discharge.  Discharge destination:  Home  Is [name] on multiple antipsychotic therapies at discharge:  No   Has [name] had three or more failed trials of antipsychotic monotherapy by history:  No  Recommended Plan for Multiple Antipsychotic Therapies: NA  Discharge Instructions    Diet - low sodium heart healthy    Complete by:  As directed    Increase activity slowly    Complete by:  As directed      Allergies as of 03/10/2017   No Known Allergies     Medication List    TAKE these medications     Indication  clonazePAM 0.5 MG tablet Commonly known as:  KLONOPIN Take 1 tablet (0.5 mg total) by mouth at bedtime. As needed for sleep  Indication:  Panic Disorder  fluticasone 50 MCG/ACT nasal spray Commonly known as:  FLONASE Place 2 sprays into both nostrils daily. Prn head congestion  Indication:  Signs and Symptoms of Nose Diseases   loratadine 10 MG tablet Commonly known as:  CLARITIN Take 1 tablet (10 mg total) by mouth daily.  Indication:  Hayfever   pantoprazole 40 MG tablet Commonly known as:  PROTONIX Take 1 tablet (40 mg total) by mouth daily.  Indication:  Gastroesophageal Reflux Disease   traZODone 150 MG tablet Commonly known as:  DESYREL Take 1 tablet (150 mg total) by mouth at bedtime.  Indication:  Trouble Sleeping      Follow-up Information    [facility] up on 03/14/2017.   Why:  Follow-up appointment with your provider Mallie Snooks at 10:40am on 03/14/2017. Follow-up appointment with your therapist Joni Riffey at 03/20/2017 11:00am. Bring discharge summary with you to this appointment.  Contact information: [address] [place] [address] [phone]           Follow-up recommendations:  Activity:  as tolerated. Diet:  low sodium heart healthy. Other:  keep follow up appointments.  Comments:    Signed: Kristine Linea, MD 03/10/2017, 11:16 AM

## 2017-05-15 ENCOUNTER — Ambulatory Visit: Payer: Self-pay | Admitting: Family Medicine

## 2017-05-16 ENCOUNTER — Ambulatory Visit (INDEPENDENT_AMBULATORY_CARE_PROVIDER_SITE_OTHER): Payer: Medicaid Other | Admitting: Family Medicine

## 2017-05-16 ENCOUNTER — Telehealth: Payer: Self-pay | Admitting: Family Medicine

## 2017-05-16 ENCOUNTER — Encounter: Payer: Self-pay | Admitting: Family Medicine

## 2017-05-16 VITALS — BP 108/78 | Temp 98.5°F | Ht 64.0 in | Wt 232.2 lb

## 2017-05-16 DIAGNOSIS — R197 Diarrhea, unspecified: Secondary | ICD-10-CM

## 2017-05-16 DIAGNOSIS — B349 Viral infection, unspecified: Secondary | ICD-10-CM | POA: Diagnosis not present

## 2017-05-16 DIAGNOSIS — R635 Abnormal weight gain: Secondary | ICD-10-CM

## 2017-05-16 DIAGNOSIS — J019 Acute sinusitis, unspecified: Secondary | ICD-10-CM

## 2017-05-16 MED ORDER — AMOXICILLIN 500 MG PO TABS: 500.0000 mg | ORAL_TABLET | Freq: Three times a day (TID) | ORAL | 0 refills | Status: DC

## 2017-05-16 NOTE — Patient Instructions (Addendum)
No sugary drinks!!!!    Calorie Counting for Weight Loss Calories are units of energy. Your body needs a certain amount of calories from food to keep you going throughout the day. When you eat more calories than your body needs, your body stores the extra calories as fat. When you eat fewer calories than your body needs, your body burns fat to get the energy it needs. Calorie counting means keeping track of how many calories you eat and drink each day. Calorie counting can be helpful if you need to lose weight. If you make sure to eat fewer calories than your body needs, you should lose weight. Ask your health care provider what a healthy weight is for you. For calorie counting to work, you will need to eat the right number of calories in a day in order to lose a healthy amount of weight per week. A dietitian can help you determine how many calories you need in a day and will give you suggestions on how to reach your calorie goal.  A healthy amount of weight to lose per week is usually 1-2 lb (0.5-0.9 kg). This usually means that your daily calorie intake should be reduced by 500-750 calories.  Eating 1,200 - 1,500 calories per day can help most women lose weight.  Eating 1,500 - 1,800 calories per day can help most men lose weight.  What is my plan? My goal is to have __________ calories per day. If I have this many calories per day, I should lose around __________ pounds per week. What do I need to know about calorie counting? In order to meet your daily calorie goal, you will need to:  Find out how many calories are in each food you would like to eat. Try to do this before you eat.  Decide how much of the food you plan to eat.  Write down what you ate and how many calories it had. Doing this is called keeping a food log.  To successfully lose weight, it is important to balance calorie counting with a healthy lifestyle that includes regular activity. Aim for 150 minutes of moderate  exercise (such as walking) or 75 minutes of vigorous exercise (such as running) each week. Where do I find calorie information?  The number of calories in a food can be found on a Nutrition Facts label. If a food does not have a Nutrition Facts label, try to look up the calories online or ask your dietitian for help. Remember that calories are listed per serving. If you choose to have more than one serving of a food, you will have to multiply the calories per serving by the amount of servings you plan to eat. For example, the label on a package of bread might say that a serving size is 1 slice and that there are 90 calories in a serving. If you eat 1 slice, you will have eaten 90 calories. If you eat 2 slices, you will have eaten 180 calories. How do I keep a food log? Immediately after each meal, record the following information in your food log:  What you ate. Don't forget to include toppings, sauces, and other extras on the food.  How much you ate. This can be measured in cups, ounces, or number of items.  How many calories each food and drink had.  The total number of calories in the meal.  Keep your food log near you, such as in a small notebook in your pocket, or use  a mobile app or website. Some programs will calculate calories for you and show you how many calories you have left for the day to meet your goal. What are some calorie counting tips?  Use your calories on foods and drinks that will fill you up and not leave you hungry: ? Some examples of foods that fill you up are nuts and nut butters, vegetables, lean proteins, and high-fiber foods like whole grains. High-fiber foods are foods with more than 5 g fiber per serving. ? Drinks such as sodas, specialty coffee drinks, alcohol, and juices have a lot of calories, yet do not fill you up.  Eat nutritious foods and avoid empty calories. Empty calories are calories you get from foods or beverages that do not have many vitamins or  protein, such as candy, sweets, and soda. It is better to have a nutritious high-calorie food (such as an avocado) than a food with few nutrients (such as a bag of chips).  Know how many calories are in the foods you eat most often. This will help you calculate calorie counts faster.  Pay attention to calories in drinks. Low-calorie drinks include water and unsweetened drinks.  Pay attention to nutrition labels for "low fat" or "fat free" foods. These foods sometimes have the same amount of calories or more calories than the full fat versions. They also often have added sugar, starch, or salt, to make up for flavor that was removed with the fat.  Find a way of tracking calories that works for you. Get creative. Try different apps or programs if writing down calories does not work for you. What are some portion control tips?  Know how many calories are in a serving. This will help you know how many servings of a certain food you can have.  Use a measuring cup to measure serving sizes. You could also try weighing out portions on a kitchen scale. With time, you will be able to estimate serving sizes for some foods.  Take some time to put servings of different foods on your favorite plates, bowls, and cups so you know what a serving looks like.  Try not to eat straight from a bag or box. Doing this can lead to overeating. Put the amount you would like to eat in a cup or on a plate to make sure you are eating the right portion.  Use smaller plates, glasses, and bowls to prevent overeating.  Try not to multitask (for example, watch TV or use your computer) while eating. If it is time to eat, sit down at a table and enjoy your food. This will help you to know when you are full. It will also help you to be aware of what you are eating and how much you are eating. What are tips for following this plan? Reading food labels  Check the calorie count compared to the serving size. The serving size may be  smaller than what you are used to eating.  Check the source of the calories. Make sure the food you are eating is high in vitamins and protein and low in saturated and trans fats. Shopping  Read nutrition labels while you shop. This will help you make healthy decisions before you decide to purchase your food.  Make a grocery list and stick to it. Cooking  Try to cook your favorite foods in a healthier way. For example, try baking instead of frying.  Use low-fat dairy products. Meal planning  Use more fruits and  vegetables. Half of your plate should be fruits and vegetables.  Include lean proteins like poultry and fish. How do I count calories when eating out?  Ask for smaller portion sizes.  Consider sharing an entree and sides instead of getting your own entree.  If you get your own entree, eat only half. Ask for a box at the beginning of your meal and put the rest of your entree in it so you are not tempted to eat it.  If calories are listed on the menu, choose the lower calorie options.  Choose dishes that include vegetables, fruits, whole grains, low-fat dairy products, and lean protein.  Choose items that are boiled, broiled, grilled, or steamed. Stay away from items that are buttered, battered, fried, or served with cream sauce. Items labeled "crispy" are usually fried, unless stated otherwise.  Choose water, low-fat milk, unsweetened iced tea, or other drinks without added sugar. If you want an alcoholic beverage, choose a lower calorie option such as a glass of wine or light beer.  Ask for dressings, sauces, and syrups on the side. These are usually high in calories, so you should limit the amount you eat.  If you want a salad, choose a garden salad and ask for grilled meats. Avoid extra toppings like bacon, cheese, or fried items. Ask for the dressing on the side, or ask for olive oil and vinegar or lemon to use as dressing.  Estimate how many servings of a food you are  given. For example, a serving of cooked rice is  cup or about the size of half a baseball. Knowing serving sizes will help you be aware of how much food you are eating at restaurants. The list below tells you how big or small some common portion sizes are based on everyday objects: ? 1 oz-4 stacked dice. ? 3 oz-1 deck of cards. ? 1 tsp-1 die. ? 1 Tbsp- a ping-pong ball. ? 2 Tbsp-1 ping-pong ball. ?  cup- baseball. ? 1 cup-1 baseball. Summary  Calorie counting means keeping track of how many calories you eat and drink each day. If you eat fewer calories than your body needs, you should lose weight.  A healthy amount of weight to lose per week is usually 1-2 lb (0.5-0.9 kg). This usually means reducing your daily calorie intake by 500-750 calories.  The number of calories in a food can be found on a Nutrition Facts label. If a food does not have a Nutrition Facts label, try to look up the calories online or ask your dietitian for help.  Use your calories on foods and drinks that will fill you up, and not on foods and drinks that will leave you hungry.  Use smaller plates, glasses, and bowls to prevent overeating. This information is not intended to replace advice given to you by your health care provider. Make sure you discuss any questions you have with your health care provider. Document Released: 07/11/2005 Document Revised: 06/10/2016 Document Reviewed: 06/10/2016 Elsevier Interactive Patient Education  2017 [employer]      Diabetes Mellitus and Food It is important for you to manage your blood sugar (glucose) level. Your blood glucose level can be greatly affected by what you eat. Eating healthier foods in the appropriate amounts throughout the day at about the same time each day will help you control your blood glucose level. It can also help slow or prevent worsening of your diabetes mellitus. Healthy eating may even help you improve the level of  your blood pressure and reach  or maintain a healthy weight. General recommendations for healthful eating and cooking habits include:  Eating meals and snacks regularly. Avoid going long periods of time without eating to lose weight.  Eating a diet that consists mainly of plant-based foods, such as fruits, vegetables, nuts, legumes, and whole grains.  Using low-heat cooking methods, such as baking, instead of high-heat cooking methods, such as deep frying.  Work with your dietitian to make sure you understand how to use the Nutrition Facts information on food labels. How can food affect me? Carbohydrates Carbohydrates affect your blood glucose level more than any other type of food. Your dietitian will help you determine how many carbohydrates to eat at each meal and teach you how to count carbohydrates. Counting carbohydrates is important to keep your blood glucose at a healthy level, especially if you are using insulin or taking certain medicines for diabetes mellitus. Alcohol Alcohol can cause sudden decreases in blood glucose (hypoglycemia), especially if you use insulin or take certain medicines for diabetes mellitus. Hypoglycemia can be a life-threatening condition. Symptoms of hypoglycemia (sleepiness, dizziness, and disorientation) are similar to symptoms of having too much alcohol. If your health care provider has given you approval to drink alcohol, do so in moderation and use the following guidelines:  Women should not have more than one drink per day, and men should not have more than two drinks per day. One drink is equal to: ? 12 oz of beer. ? 5 oz of wine. ? 1 oz of hard liquor.  Do not drink on an empty stomach.  Keep yourself hydrated. Have water, diet soda, or unsweetened iced tea.  Regular soda, juice, and other mixers might contain a lot of carbohydrates and should be counted.  What foods are not recommended? As you make food choices, it is important to remember that all foods are not the same.  Some foods have fewer nutrients per serving than other foods, even though they might have the same number of calories or carbohydrates. It is difficult to get your body what it needs when you eat foods with fewer nutrients. Examples of foods that you should avoid that are high in calories and carbohydrates but low in nutrients include:  Trans fats (most processed foods list trans fats on the Nutrition Facts label).  Regular soda.  Juice.  Candy.  Sweets, such as cake, pie, doughnuts, and cookies.  Fried foods.  What foods can I eat? Eat nutrient-rich foods, which will nourish your body and keep you healthy. The food you should eat also will depend on several factors, including:  The calories you need.  The medicines you take.  Your weight.  Your blood glucose level.  Your blood pressure level.  Your cholesterol level.  You should eat a variety of foods, including:  Protein. ? Lean cuts of meat. ? Proteins low in saturated fats, such as fish, egg whites, and beans. Avoid processed meats.  Fruits and vegetables. ? Fruits and vegetables that may help control blood glucose levels, such as apples, mangoes, and yams.  Dairy products. ? Choose fat-free or low-fat dairy products, such as milk, yogurt, and cheese.  Grains, bread, pasta, and rice. ? Choose whole grain products, such as multigrain bread, whole oats, and brown rice. These foods may help control blood pressure.  Fats. ? Foods containing healthful fats, such as nuts, avocado, olive oil, canola oil, and fish.  Does everyone with diabetes mellitus have the same meal plan?  Because every person with diabetes mellitus is different, there is not one meal plan that works for everyone. It is very important that you meet with a dietitian who will help you create a meal plan that is just right for you. This information is not intended to replace advice given to you by your health care provider. Make sure you discuss any  questions you have with your health care provider. Document Released: 04/07/2005 Document Revised: 12/17/2015 Document Reviewed: 06/07/2013 Elsevier Interactive Patient Education  2017 [employer]  Diarrhea, Adult Diarrhea is when you have loose and water poop (stool) often. Diarrhea can make you feel weak and cause you to get dehydrated. Dehydration can make you tired and thirsty, make you have a dry mouth, and make it so you pee (urinate) less often. Diarrhea often lasts 2-3 days. However, it can last longer if it is a sign of something more serious. It is important to treat your diarrhea as told by your doctor. Follow these instructions at home: Eating and drinking  Follow these recommendations as told by your doctor:  Take an oral rehydration solution (ORS). This is a drink that is sold at pharmacies and stores.  Drink clear fluids, such as: ? Water. ? Ice chips. ? Diluted fruit juice. ? Low-calorie sports drinks.  Eat bland, easy-to-digest foods in small amounts as you are able. These foods include: ? Bananas. ? Applesauce. ? Rice. ? Low-fat (lean) meats. ? Toast. ? Crackers.  Avoid drinking fluids that have a lot of sugar or caffeine in them.  Avoid alcohol.  Avoid spicy or fatty foods.  General instructions   Drink enough fluid to keep your pee (urine) clear or pale yellow.  Wash your hands often. If you cannot use soap and water, use hand sanitizer.  Make sure that all people in your home wash their hands well and often.  Take over-the-counter and prescription medicines only as told by your doctor.  Rest at home while you get better.  Watch your condition for any changes.  Take a warm bath to help with any burning or pain from having diarrhea.  Keep all follow-up visits as told by your doctor. This is important. Contact a doctor if:  You have a fever.  Your diarrhea gets worse.  You have new symptoms.  You cannot keep fluids down.  You feel  light-headed or dizzy.  You have a headache.  You have muscle cramps. Get help right away if:  You have chest pain.  You feel very weak or you pass out (faint).  You have bloody or black poop or poop that look like tar.  You have very bad pain, cramping, or bloating in your belly (abdomen).  You have trouble breathing or you are breathing very quickly.  Your heart is beating very quickly.  Your skin feels cold and clammy.  You feel confused.  You have signs of dehydration, such as: ? Dark pee, hardly any pee, or no pee. ? Cracked lips. ? Dry mouth. ? Sunken eyes. ? Sleepiness. ? Weakness. This information is not intended to replace advice given to you by your health care provider. Make sure you discuss any questions you have with your health care provider. Document Released: 12/28/2007 Document Revised: 01/29/2016 Document Reviewed: 03/17/2015 Elsevier Interactive Patient Education  2018 [employer]

## 2017-05-16 NOTE — Telephone Encounter (Signed)
Erica-I am uncertain if this organization is requesting records from us? If so the amount of records is tremendously large-I truly doubt they need all of our records to help him-please look into this and send what is necessary without much this amount of extra after thank you-please see record request form

## 2017-05-16 NOTE — Progress Notes (Signed)
   Subjective:    Patient ID: Elijah [name], male    DOB: 08/02/1995, 21 y.o.   MRN: [id number]  Cough  This is a new problem. The current episode started in the past 7 days. Associated symptoms include ear pain, a fever, headaches, myalgias, nasal congestion, rhinorrhea and a sore throat. Pertinent negatives include no chest pain or wheezing. Associated symptoms comments: Diarrhea, fatigue, dizziness .   Patient relates head congestion drainage coughing some diarrhea denies high fever chills denies wheezing denies difficulty breathing. PMH benign States no concerns this visit.   Review of Systems  Constitutional: Positive for fever. Negative for activity change.  HENT: Positive for congestion, ear pain, rhinorrhea and sore throat.   Eyes: Negative for discharge.  Respiratory: Positive for cough. Negative for wheezing.   Cardiovascular: Negative for chest pain.  Musculoskeletal: Positive for myalgias.  Neurological: Positive for headaches.       Objective:   Physical Exam  Constitutional: He appears well-developed.  HENT:  Head: Normocephalic.  Mouth/Throat: Oropharynx is clear and moist. No oropharyngeal exudate.  Neck: Normal range of motion.  Cardiovascular: Normal rate, regular rhythm and normal heart sounds.   No murmur heard. Pulmonary/Chest: Effort normal and breath sounds normal. He has no wheezes.  Lymphadenopathy:    He has no cervical adenopathy.  Neurological: He exhibits normal muscle tone.  Skin: Skin is warm and dry.  Nursing note and vitals reviewed.         Assessment & Plan:  Viral syndrome Secondary rhinosinusitis Antibiotics prescribed Viral diarrhea Supportive measures discussed Warning signs were discussed follow-up if ongoing troubles

## 2017-06-19 ENCOUNTER — Ambulatory Visit (INDEPENDENT_AMBULATORY_CARE_PROVIDER_SITE_OTHER): Payer: Medicaid Other | Admitting: Family Medicine

## 2017-06-19 ENCOUNTER — Encounter: Payer: Self-pay | Admitting: Family Medicine

## 2017-06-19 VITALS — BP 132/84 | Ht 64.0 in | Wt 239.4 lb

## 2017-06-19 DIAGNOSIS — F339 Major depressive disorder, recurrent, unspecified: Secondary | ICD-10-CM | POA: Diagnosis not present

## 2017-06-19 DIAGNOSIS — F320 Major depressive disorder, single episode, mild: Secondary | ICD-10-CM

## 2017-06-19 MED ORDER — SERTRALINE HCL 50 MG PO TABS: 50.0000 mg | ORAL_TABLET | Freq: Every day | ORAL | 0 refills | Status: DC

## 2017-06-19 NOTE — Progress Notes (Signed)
   Subjective:    Patient ID: Elijah [name], male    DOB: 08/11/1995, 21 y.o.   MRN: [id number]  HPI  Patient arrives for a follow up on depression. 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    Objective:   Physical Exam Multiple cavities Neck no masses Lungs clear heart regular [facility]  Decreased Interest 2  Down, Depressed, Hopeless 3  PHQ - 2 Score 5  Altered sleeping 0  Tired, decreased energy 2  Change in appetite 3  Feeling bad or failure about yourself  2  Trouble concentrating 2  Moving slowly or fidgety/restless 2  Suicidal thoughts 0  PHQ-9 Score 16     25 minutes was spent with the patient. Greater than half the time was spent in discussion and answering questions and counseling regarding the issues that the patient came in for today.     Assessment & Plan:  Major depression-start Zoloft 50 mg daily He stays at home Mother was told to keep all medications and guns secured and if any suicidal ideation or threats to immediately seek help at ER Follow-up here 3-4 weeks  Patient has mental health issues and also has poor impulse control plus also  has difficulty processing and thinking Has mild MR He is going to vocational rehab later today for further evaluation  I do not believe it is in this young man's best interest to go to college I think he would not achieve well I believe this patient is disabled and needs help of others I do not feel that he can live independently on his own and manage his funds

## 2017-06-20 ENCOUNTER — Encounter: Payer: Self-pay | Admitting: Family Medicine

## 2017-06-23 ENCOUNTER — Encounter: Payer: Self-pay | Admitting: Family Medicine

## 2017-06-23 ENCOUNTER — Ambulatory Visit (INDEPENDENT_AMBULATORY_CARE_PROVIDER_SITE_OTHER): Payer: Medicaid Other | Admitting: Family Medicine

## 2017-06-23 VITALS — BP 120/78 | Temp 98.7°F | Ht 64.0 in | Wt 237.0 lb

## 2017-06-23 DIAGNOSIS — J019 Acute sinusitis, unspecified: Secondary | ICD-10-CM | POA: Diagnosis not present

## 2017-06-23 MED ORDER — CEFPROZIL 500 MG PO TABS: 500.0000 mg | ORAL_TABLET | Freq: Two times a day (BID) | ORAL | 0 refills | Status: DC

## 2017-06-23 NOTE — Progress Notes (Signed)
   Subjective:    Patient ID: Elijah [name], male    DOB: 05/11/1996, 21 y.o.   MRN: [id number] Patient seemed after hours rather than sent to emergency room HPI Patient is here today for ear pain,cough,sore throat, headache,diarrhea for a couple days. Has not taken anything for it.  Frontal headaceh worse with postiion  Pos gunky discharge   Cough off and on, prod at times  No fever   Review of Systems No headache, no major weight loss or weight gain, no chest pain no back pain abdominal pain no change in bowel habits complete ROS otherwise negative     Objective:   Physical Exam  Alert, mild malaise. Hydration good Vitals stable. frontal/ maxillary tenderness evident positive nasal congestion. pharynx normal neck supple  lungs clear/no crackles or wheezes. heart regular in rhythm       Assessment & Plan:  Impression rhinosinusitis likely post viral, discussed with patient. plan antibiotics prescribed. Questions answered. Symptomatic care discussed. warning signs discussed. WSL

## 2017-06-30 ENCOUNTER — Telehealth: Payer: Self-pay | Admitting: Family Medicine

## 2017-06-30 NOTE — Telephone Encounter (Signed)
Pt was seen last week and called today stating that he is not any better. Please advise.

## 2017-06-30 NOTE — Telephone Encounter (Signed)
Nurses-I am okay with authorizing doxycycline 100 mg 1 twice daily for 7 days-call the patient let him know we will send that in if he is not doing better with that he will need to be rechecked.  It is his option if he wants to go to urgent care or ER but if he would rather try an additional round of antibiotics he can

## 2017-06-30 NOTE — Telephone Encounter (Signed)
I spoke with the pt and he says he elects to be seen by the urgent care center as he already has an appt with them this afternoon at 1 pm.

## 2017-06-30 NOTE — Telephone Encounter (Signed)
I spoke with the pt he states he is still having stopped up nose,sore throat and diarrhea. He still has a few of the antibx left. I informed him we have no more availability that he would need to go to the urgent care.

## 2017-07-12 ENCOUNTER — Ambulatory Visit: Payer: Medicaid Other | Admitting: Family Medicine

## 2017-07-27 ENCOUNTER — Encounter: Payer: Self-pay | Admitting: Family Medicine

## 2017-07-27 ENCOUNTER — Ambulatory Visit (INDEPENDENT_AMBULATORY_CARE_PROVIDER_SITE_OTHER): Payer: Medicaid Other | Admitting: Family Medicine

## 2017-07-27 VITALS — BP 108/70 | Temp 98.5°F | Ht 64.0 in | Wt 236.0 lb

## 2017-07-27 DIAGNOSIS — J019 Acute sinusitis, unspecified: Secondary | ICD-10-CM

## 2017-07-27 DIAGNOSIS — Z23 Encounter for immunization: Secondary | ICD-10-CM

## 2017-07-27 DIAGNOSIS — S31813A Puncture wound without foreign body of right buttock, initial encounter: Secondary | ICD-10-CM | POA: Diagnosis not present

## 2017-07-27 DIAGNOSIS — W450XXA Nail entering through skin, initial encounter: Secondary | ICD-10-CM

## 2017-07-27 MED ORDER — AMOXICILLIN-POT CLAVULANATE 875-125 MG PO TABS: 1.0000 | ORAL_TABLET | Freq: Two times a day (BID) | ORAL | 0 refills | Status: DC

## 2017-07-27 MED ORDER — ALBUTEROL SULFATE HFA 108 (90 BASE) MCG/ACT IN AERS: 2.0000 | INHALATION_SPRAY | Freq: Four times a day (QID) | RESPIRATORY_TRACT | 0 refills | Status: DC | PRN

## 2017-07-27 NOTE — Progress Notes (Signed)
   Subjective:    Patient ID: Elijah [name], male    DOB: 01/21/1996, 22 y.o.   MRN: [id number]  Sinusitis  This is a new problem. Episode onset: 2 days. Associated symptoms include congestion, coughing, ear pain, headaches and a sore throat. (Wheezing, fever)   Stuck by a rusty nail on right side the mid December.                                                                                                                                          Last tetanus was 2010.   Review of Systems  HENT: Positive for congestion, ear pain and sore throat.   Respiratory: Positive for cough.   Neurological: Positive for headaches.       Objective:   Physical Exam  Alert, mild malaise. Hydration good Vitals stable. frontal/ maxillary tenderness evident positive nasal congestion. pharynx normal neck supple  lungs clear/no crackles however positive mild expiratory wheezes. heart regular in rhythm       Assessment & Plan:  Impression rhinosinusitis/bronchitis with element of reactive airways.  Ventolin prescribed in educated.  Tetanus shot with history of puncture wound with rusty nail and no tetanus 9 years.  Likely post viral, discussed with patient. plan antibiotics prescribed. Questions answered. Symptomatic care discussed. warning signs discussed. WSL

## 2017-07-28 ENCOUNTER — Other Ambulatory Visit: Payer: Self-pay

## 2017-07-28 ENCOUNTER — Emergency Department (HOSPITAL_COMMUNITY)
Admission: EM | Admit: 2017-07-28 | Discharge: 2017-07-29 | Disposition: A | Discharge status: Home / Self Care | Payer: Medicaid Other | Attending: Emergency Medicine | Admitting: Emergency Medicine

## 2017-07-28 ENCOUNTER — Encounter (HOSPITAL_COMMUNITY): Payer: Self-pay | Admitting: Emergency Medicine

## 2017-07-28 ENCOUNTER — Emergency Department (HOSPITAL_COMMUNITY): Payer: Medicaid Other

## 2017-07-28 DIAGNOSIS — J40 Bronchitis, not specified as acute or chronic: Secondary | ICD-10-CM | POA: Diagnosis not present

## 2017-07-28 DIAGNOSIS — R05 Cough: Secondary | ICD-10-CM | POA: Diagnosis present

## 2017-07-28 DIAGNOSIS — J209 Acute bronchitis, unspecified: Secondary | ICD-10-CM

## 2017-07-28 NOTE — ED Triage Notes (Signed)
Pt reports seen for same at PCP and was diagnosed with URI. Pt was given inhaler with minimal relief. Pt reports generalized body aches, chest tightness with coughing, sore throat. Clear lungs auscultated in triage.

## 2017-07-29 ENCOUNTER — Encounter (HOSPITAL_COMMUNITY): Payer: Self-pay | Admitting: *Deleted

## 2017-07-29 ENCOUNTER — Other Ambulatory Visit: Payer: Self-pay

## 2017-07-29 ENCOUNTER — Emergency Department (HOSPITAL_COMMUNITY)
Admission: EM | Admit: 2017-07-29 | Discharge: 2017-07-30 | Disposition: A | Discharge status: Home / Self Care | Payer: Medicaid Other | Source: Home / Self Care | Attending: Emergency Medicine | Admitting: Emergency Medicine

## 2017-07-29 DIAGNOSIS — J209 Acute bronchitis, unspecified: Secondary | ICD-10-CM

## 2017-07-29 LAB — RAPID STREP SCREEN (MED CTR MEBANE ONLY): Streptococcus, Group A Screen (Direct): NEGATIVE

## 2017-07-29 MED ORDER — ALBUTEROL SULFATE HFA 108 (90 BASE) MCG/ACT IN AERS: 2.0000 | INHALATION_SPRAY | RESPIRATORY_TRACT | 0 refills | Status: DC | PRN

## 2017-07-29 MED ORDER — AEROCHAMBER Z-STAT PLUS/MEDIUM MISC
1.0000 | Freq: Once | Status: AC
  Administered 2017-07-29: 1
  Filled 2017-07-29: qty 1

## 2017-07-29 MED ORDER — IPRATROPIUM BROMIDE 0.02 % IN SOLN
0.5000 mg | Freq: Once | RESPIRATORY_TRACT | Status: AC
  Administered 2017-07-30: 0.5 mg via RESPIRATORY_TRACT
  Filled 2017-07-29: qty 2.5

## 2017-07-29 MED ORDER — ALBUTEROL (5 MG/ML) CONTINUOUS INHALATION SOLN
10.0000 mg/h | INHALATION_SOLUTION | Freq: Once | RESPIRATORY_TRACT | Status: AC
  Administered 2017-07-29: 10 mg/h via RESPIRATORY_TRACT
  Filled 2017-07-29: qty 20

## 2017-07-29 MED ORDER — PREDNISONE 20 MG PO TABS: ORAL_TABLET | ORAL | 0 refills | Status: DC

## 2017-07-29 MED ORDER — ALBUTEROL SULFATE (2.5 MG/3ML) 0.083% IN NEBU
5.0000 mg | INHALATION_SOLUTION | Freq: Once | RESPIRATORY_TRACT | Status: AC
  Administered 2017-07-29: 5 mg via RESPIRATORY_TRACT
  Filled 2017-07-29: qty 6

## 2017-07-29 MED ORDER — ALBUTEROL (5 MG/ML) CONTINUOUS INHALATION SOLN
10.0000 mg/h | INHALATION_SOLUTION | Freq: Once | RESPIRATORY_TRACT | Status: AC
  Administered 2017-07-30: 10 mg/h via RESPIRATORY_TRACT
  Filled 2017-07-29: qty 20

## 2017-07-29 MED ORDER — IPRATROPIUM BROMIDE 0.02 % IN SOLN
0.5000 mg | Freq: Once | RESPIRATORY_TRACT | Status: AC
  Administered 2017-07-29: 0.5 mg via RESPIRATORY_TRACT
  Filled 2017-07-29: qty 2.5

## 2017-07-29 MED ORDER — PREDNISONE 50 MG PO TABS
60.0000 mg | ORAL_TABLET | Freq: Once | ORAL | Status: AC
  Administered 2017-07-29: 60 mg via ORAL
  Filled 2017-07-29: qty 1

## 2017-07-29 NOTE — ED Notes (Signed)
Respiratory paged

## 2017-07-29 NOTE — ED Notes (Signed)
RT notified of neb tx order.

## 2017-07-29 NOTE — Discharge Instructions (Signed)
Use the inhaler for wheezing, shortness of breath and coughing. You can take mucinex DM OTC for your cough. You can take sudafed OTC for your nasal congestion. Take the prednisone until gone. I gave you another prescription for an inhaler in case your run out.  Recheck if you get a fever, struggle to breathe despite using your inhaler or if you aren't better in the next week.

## 2017-07-29 NOTE — ED Provider Notes (Signed)
[facility] EMERGENCY DEPARTMENT Provider Note   CSN: [id number] Arrival date & time: 07/28/17  1549  Time seen 23:59 PM    History   Chief Complaint Chief Complaint  Patient presents with  . Cough    HPI Elijah [name] is a 22 y.o. male.  HPI patient states he started feeling bad before Christmas with a dry cough, sore throat when he coughs or when he eats, sneezing that and stuffy nose that started today, and anterior chest pain that hurts when he coughs.  He states he feels short of breath and he has been wheezing.  He states he is never had that before.  He was seen by his PCP and was started on an albuterol inhaler.  He states he does not feel any better.  He does not feel like he has had a fever.  He denies nausea, vomiting, or diarrhea.  Nobody else is sick at home.  PCP Babs Sciara, MD   Past Medical History:  Diagnosis Date  . Acid reflux   . ADD (attention deficit disorder)   . ADHD (attention deficit hyperactivity disorder)   . Autism   . Learning disability     Patient Active Problem List   Diagnosis Date Noted  . GERD (gastroesophageal reflux disease) 03/09/2017  . Suicidal ideation 03/09/2017  . Adjustment disorder with mixed anxiety and depressed mood 03/09/2017  . Autism spectrum disorder 03/08/2017  . Intellectual disability 11/04/2016  . Aggressive behavior   . Auditory hallucinations   . Learning disability 12/21/2014  . ADD (attention deficit disorder) 11/24/2012  . Morbid obesity (HCC) 11/24/2012    History reviewed. No pertinent surgical history.     Home Medications    Prior to Admission medications   Medication Sig Start Date End Date Taking? Authorizing Provider  albuterol (PROVENTIL HFA;VENTOLIN HFA) 108 (90 Base) MCG/ACT inhaler Inhale 2 puffs into the lungs every 6 (six) hours as needed for wheezing or shortness of breath. 07/27/17   Merlyn Albert, MD  albuterol (PROVENTIL HFA;VENTOLIN HFA) 108 (90 Base) MCG/ACT inhaler  Inhale 2 puffs into the lungs every 4 (four) hours as needed. 07/29/17   Devoria Albe, MD  amoxicillin-clavulanate (AUGMENTIN) 875-125 MG tablet Take 1 tablet by mouth 2 (two) times daily. 07/27/17   Merlyn Albert, MD  fluticasone (FLONASE) 50 MCG/ACT nasal spray Place 2 sprays into both nostrils daily. Prn head congestion 02/11/17   Loren Racer, MD  loratadine (CLARITIN) 10 MG tablet Take 1 tablet (10 mg total) by mouth daily. 02/11/17   Loren Racer, MD  pantoprazole (PROTONIX) 40 MG tablet Take 1 tablet (40 mg total) by mouth daily. 03/10/17   Pucilowska, Braulio Conte B, MD  predniSONE (DELTASONE) 20 MG tablet Take 3 po QD x 3d , then 2 po QD x 3d then 1 po QD x 3d 07/29/17   Devoria Albe, MD  sertraline (ZOLOFT) 50 MG tablet Take 1 tablet (50 mg total) by mouth daily. 06/19/17   Babs Sciara, MD    Family History History reviewed. No pertinent family history.  Social History Social History   Tobacco Use  . Smoking status: Never Smoker  . Smokeless tobacco: Never Used  Substance Use Topics  . Alcohol use: Yes    Comment: occassional  . Drug use: No  On disability for ADHD Lives with his mother States he smokes about 1 cigarette a week depending on his stress level   Allergies   Patient has no known allergies.  Review of Systems Review of Systems  All other systems reviewed and are negative.    Physical Exam Updated Vital Signs BP 132/71 (BP Location: Right Arm)   Pulse (!) 120   Temp 97.6 F (36.4 C) (Oral)   Resp 20   Ht 5\' 4"  (1.626 m)   Wt 107 kg (236 lb)   SpO2 100%   BMI 40.51 kg/m   Vital signs normal except for tachycardia   Physical Exam  Constitutional: He is oriented to person, place, and time. He appears well-developed and well-nourished.  Non-toxic appearance. He does not appear ill. No distress.  HENT:  Head: Normocephalic and atraumatic.  Right Ear: External ear normal.  Left Ear: External ear normal.  Nose: Nose normal. No mucosal edema or  rhinorrhea.  Mouth/Throat: Oropharynx is clear and moist and mucous membranes are normal. No dental abscesses or uvula swelling.  Sniffling, no obvious nasal swelling  Eyes: Conjunctivae and EOM are normal. Pupils are equal, round, and reactive to light.  Neck: Normal range of motion and full passive range of motion without pain. Neck supple.  Cardiovascular: Normal rate, regular rhythm and normal heart sounds. Exam reveals no gallop and no friction rub.  No murmur heard. Pulmonary/Chest: Effort normal. Tachypnea noted. No respiratory distress. He has decreased breath sounds. He has wheezes. He has no rhonchi. He has no rales. He exhibits no tenderness and no crepitus.  Coughs when takes deep breaths  Abdominal: Normal appearance.  Musculoskeletal: Normal range of motion. He exhibits no edema or tenderness.  Moves all extremities well.   Neurological: He is alert and oriented to person, place, and time. He has normal strength. No cranial nerve deficit.  Skin: Skin is warm, dry and intact. No rash noted. No erythema. No pallor.  Psychiatric: He has a normal mood and affect. His speech is normal and behavior is normal. His mood appears not anxious.  Nursing note and vitals reviewed.    ED Treatments / Results  Labs (all labs ordered are listed, but only abnormal results are displayed) Results for orders placed or performed during the hospital encounter of 07/28/17  Rapid strep screen  Result Value Ref Range   Streptococcus, Group A Screen (Direct) NEGATIVE NEGATIVE   Laboratory interpretation all normal     EKG  EKG Interpretation None       Radiology Dg Chest 2 View  Addendum Date: 07/28/2017   ADDENDUM REPORT: 07/28/2017 17:22 ADDENDUM: Typographical error in the impression. Corrected impression should read as follows: No active cardiopulmonary disease. Electronically Signed   By: Elige KoHetal  Patel   On: 07/28/2017 17:22   Result Date: 07/28/2017 CLINICAL DATA:  Shortness of  breath for weeks. Cough. Chest tightness. EXAM: CHEST  2 VIEW COMPARISON:  None. FINDINGS: There is no focal consolidation. There is no pleural effusion or pneumothorax. The heart and mediastinal contours are unremarkable. The osseous structures are unremarkable. IMPRESSION: No acute intracranial pathology. Electronically Signed: By: Elige KoHetal  Patel On: 07/28/2017 16:47    Procedures Procedures (including critical care time)  Medications Ordered in ED Medications  aerochamber Z-Stat Plus/medium 1 each (not administered)  albuterol (PROVENTIL,VENTOLIN) solution continuous neb (10 mg/hr Nebulization Given 07/29/17 0047)  ipratropium (ATROVENT) nebulizer solution 0.5 mg (0.5 mg Nebulization Given 07/29/17 0047)  predniSONE (DELTASONE) tablet 60 mg (60 mg Oral Given 07/29/17 0041)     Initial Impression / Assessment and Plan / ED Course  I have reviewed the triage vital signs and the nursing notes.  Pertinent labs &  imaging results that were available during my care of the patient were reviewed by me and considered in my medical decision making (see chart for details).     Patient was given a continuous nebulizer of 10 mg of albuterol plus Atrovent.  He also was started on oral prednisone after reviewing his chest x-ray.  Recheck at 2:15 AM patient appears to be much better, he is walking around the room in no distress.  When I listen to them now the wheezing is gone.  He had some questions about his inhaler, I am going to have respiratory therapy show him how to use a spacer.    Final Clinical Impressions(s) / ED Diagnoses   Final diagnoses:  Bronchitis with bronchospasm    ED Discharge Orders        Ordered    predniSONE (DELTASONE) 20 MG tablet     07/29/17 0233    albuterol (PROVENTIL HFA;VENTOLIN HFA) 108 (90 Base) MCG/ACT inhaler  Every 4 hours PRN     07/29/17 0233    OTC mucinex DM  Plan discharge  Devoria Albe, MD, Concha Pyo, MD 07/29/17 [phone]

## 2017-07-29 NOTE — ED Triage Notes (Signed)
Pt arrived by EMS from home. Pt seen here last night for SOB. Pt say breathing getting worse, has used his inhaler 3 times today w/o relief.

## 2017-07-29 NOTE — ED Provider Notes (Signed)
[facility] EMERGENCY DEPARTMENT Provider Note   CSN: [id number] Arrival date & time: 07/29/17  2140  Time seen 23:25 PM   History   Chief Complaint Chief Complaint  Patient presents with  . Shortness of Breath    HPI NILSON [name] is a 22 y.o. male.  HPI I saw this patient last night with the same complaints.  HPI  from last night:  Patient states he started feeling bad before Christmas with a dry cough, sore throat when he coughs or when he eats, sneezing that and stuffy nose that started today, and anterior chest pain that hurts when he coughs.  He states he feels short of breath and he has been wheezing.  He states he is never had that before.  He was seen by his PCP and was started on an albuterol inhaler.  He states he does not feel any better.  He does not feel like he has had a fever.  He denies nausea, vomiting, or diarrhea.  Nobody else is sick at home  He states after he left the ED he laid down and when he got up later in the day he started using his inhaler.  He states the last time he used it was about an hour ago and he like it was not helping.  He did not get his prescriptions filled that were prescribed.  He also had been seen by his PCP on January 3 and was given a prescription for antibiotics that he also has not gotten filled.  PCP Babs Sciara, MD   Past Medical History:  Diagnosis Date  . Acid reflux   . ADD (attention deficit disorder)   . ADHD (attention deficit hyperactivity disorder)   . Autism   . Learning disability     Patient Active Problem List   Diagnosis Date Noted  . GERD (gastroesophageal reflux disease) 03/09/2017  . Suicidal ideation 03/09/2017  . Adjustment disorder with mixed anxiety and depressed mood 03/09/2017  . Autism spectrum disorder 03/08/2017  . Intellectual disability 11/04/2016  . Aggressive behavior   . Auditory hallucinations   . Learning disability 12/21/2014  . ADD (attention deficit disorder) 11/24/2012  .  Morbid obesity (HCC) 11/24/2012    History reviewed. No pertinent surgical history.     Home Medications    Prior to Admission medications   Medication Sig Start Date End Date Taking? Authorizing Provider  albuterol (PROVENTIL HFA;VENTOLIN HFA) 108 (90 Base) MCG/ACT inhaler Inhale 2 puffs into the lungs every 6 (six) hours as needed for wheezing or shortness of breath. 07/27/17  Yes Merlyn Albert, MD  fluticasone (FLONASE) 50 MCG/ACT nasal spray Place 2 sprays into both nostrils daily. Prn head congestion 02/11/17  Yes Loren Racer, MD  pantoprazole (PROTONIX) 40 MG tablet Take 1 tablet (40 mg total) by mouth daily. 03/10/17  Yes Pucilowska, Jolanta B, MD  albuterol (PROVENTIL HFA;VENTOLIN HFA) 108 (90 Base) MCG/ACT inhaler Inhale 2 puffs into the lungs every 4 (four) hours as needed. 07/29/17   Devoria Albe, MD  amoxicillin-clavulanate (AUGMENTIN) 875-125 MG tablet Take 1 tablet by mouth 2 (two) times daily. 07/27/17   Merlyn Albert, MD  loratadine (CLARITIN) 10 MG tablet Take 1 tablet (10 mg total) by mouth daily. Patient not taking: Reported on 07/29/2017 02/11/17   Loren Racer, MD  predniSONE (DELTASONE) 20 MG tablet Take 3 po QD x 3d , then 2 po QD x 3d then 1 po QD x 3d 07/29/17   Devoria Albe,  MD  sertraline (ZOLOFT) 50 MG tablet Take 1 tablet (50 mg total) by mouth daily. Patient not taking: Reported on 07/29/2017 06/19/17   Babs SciaraLuking, Scott A, MD    Family History No family history on file.  Social History Social History   Tobacco Use  . Smoking status: Never Smoker  . Smokeless tobacco: Never Used  Substance Use Topics  . Alcohol use: Yes    Comment: occassional  . Drug use: No  on disability Lives with mother   Allergies   Patient has no known allergies.   Review of Systems Review of Systems  All other systems reviewed and are negative.    Physical Exam Updated Vital Signs BP 93/82   Pulse (!) 112   Temp 98.2 F (36.8 C) (Oral)   Resp (!) 32   Ht 5\' 5"   (1.651 m)   Wt 105.2 kg (232 lb)   SpO2 97%   BMI 38.61 kg/m   Vital signs normal borderline hypotension, tachycardia   Physical Exam  Constitutional: He is oriented to person, place, and time. He appears well-developed and well-nourished.  Non-toxic appearance. He does not appear ill. No distress.  Patient does not appear as distressed as he did initially when I saw him last night.  He is able to talk in complete sentences.  HENT:  Head: Normocephalic and atraumatic.  Right Ear: External ear normal.  Left Ear: External ear normal.  Nose: Nose normal. No mucosal edema or rhinorrhea.  Mouth/Throat: Oropharynx is clear and moist and mucous membranes are normal. No dental abscesses or uvula swelling.  Eyes: Conjunctivae and EOM are normal. Pupils are equal, round, and reactive to light.  Neck: Normal range of motion and full passive range of motion without pain. Neck supple.  Cardiovascular: Regular rhythm and normal heart sounds. Tachycardia present. Exam reveals no gallop and no friction rub.  No murmur heard. Pulmonary/Chest: Effort normal. No respiratory distress. He has decreased breath sounds. He has wheezes. He has rhonchi. He has no rales. He exhibits no tenderness and no crepitus.  At the time of my exam patient had already received 1 nebulizer treatment  Abdominal: Soft. Normal appearance and bowel sounds are normal. He exhibits no distension. There is no tenderness. There is no rebound and no guarding.  Musculoskeletal: Normal range of motion. He exhibits no edema or tenderness.  Moves all extremities well.   Neurological: He is alert and oriented to person, place, and time. He has normal strength. No cranial nerve deficit.  Skin: Skin is warm, dry and intact. No rash noted. No erythema. No pallor.  Psychiatric: He has a normal mood and affect. His speech is normal and behavior is normal. His mood appears not anxious.  Nursing note and vitals reviewed.    ED Treatments /  Results  Labs (all labs ordered are listed, but only abnormal results are displayed) Labs Reviewed  BASIC METABOLIC PANEL - Abnormal; Notable for the following components:      Result Value   Potassium 2.9 (*)    Glucose, Bld 113 (*)    All other components within normal limits  CBC WITH DIFFERENTIAL/PLATELET - Abnormal; Notable for the following components:   WBC 11.1 (*)    Hemoglobin 12.8 (*)    Monocytes Absolute 1.1 (*)    All other components within normal limits    EKG  EKG Interpretation  Date/Time:  Saturday July 29 2017 21:47:58 EST Ventricular Rate:  120 PR Interval:    QRS Duration:  87 QT Interval:  286 QTC Calculation: 404 R Axis:   66 Text Interpretation:  Sinus tachycardia Borderline T abnormalities, diffuse leads Since last tracing rate faster 08 Mar 2017 Confirmed by Devoria Albe ([id number]) on 07/29/2017 11:05:56 PM       Radiology Dg Chest 2 View  Addendum Date: 07/28/2017   ADDENDUM REPORT: 07/28/2017 17:22 ADDENDUM: Typographical error in the impression. Corrected impression should read as follows: No active cardiopulmonary disease. Electronically Signed   By: Elige Ko   On: 07/28/2017 17:22    Procedures Procedures (including critical care time)  Medications Ordered in ED Medications  albuterol (PROVENTIL) (2.5 MG/3ML) 0.083% nebulizer solution 5 mg (5 mg Nebulization Given 07/29/17 2203)  albuterol (PROVENTIL,VENTOLIN) solution continuous neb (10 mg/hr Nebulization Given 07/30/17 0009)  ipratropium (ATROVENT) nebulizer solution 0.5 mg (0.5 mg Nebulization Given 07/30/17 0009)  predniSONE (DELTASONE) tablet 60 mg (60 mg Oral Given 07/30/17 0122)  sodium chloride 0.9 % bolus 1,000 mL (0 mLs Intravenous Stopped 07/30/17 0414)  sodium chloride 0.9 % bolus 500 mL (500 mLs Intravenous Bolus 07/30/17 0215)  magnesium sulfate IVPB 2 g 50 mL (0 g Intravenous Stopped 07/30/17 0338)  albuterol (PROVENTIL) (2.5 MG/3ML) 0.083% nebulizer solution 5 mg (5 mg Nebulization  Given 07/30/17 0237)  ipratropium (ATROVENT) nebulizer solution 0.5 mg (0.5 mg Nebulization Given 07/30/17 0237)     Initial Impression / Assessment and Plan / ED Course  I have reviewed the triage vital signs and the nursing notes.  Pertinent labs & imaging results that were available during my care of the patient were reviewed by me and considered in my medical decision making (see chart for details).      Patient had already had one nebulizer treatment, he was given a continuous nebulizer with albuterol 10 mg plus Atrovent 0.5 mg.  Recheck at 12:35 AM patient is on his continuous nebulizer, he states he has been on it not quite 30 minutes.  He is laying flat in bed watching TV.  He is complaining about the weight of the EKG leads on his chest.  I explained to him we were monitoring his heart.  When I listen to him he has scattered rhonchi rare wheezing.  I will check him again once his treatments over.  Patient was also given a dose of prednisone, he had been about 24 hours since he had it.  Recheck at 2 AM patient has finished his continuous nebulizer.  He is lying comfortably almost flat on the stretcher.  States he is feeling better.  He still has diffuse rhonchi and a few wheezes especially at the bases.  At this point IV was inserted and he was given IV magnesium.  Blood work was done.  He received a additional albuterol with Atrovent nebulizer.  Recheck at 5:15 AM patient is feeling better.  He was asleep.  On reexam his lungs are now clear.  I am going to have nurse walk the patient to see if he becomes hypoxic.  We discussed being discharged if that is normal.  Patient was ambulated by nursing staff and report his pulse ox remained 99%.  Patient was rechecked at 6:25 AM he is standing at the bedside.  He appears to be in no distress.  His lungs are clear.  He states he still has the prescriptions his PCP wrote for him in the ones I wrote for him last night.  He states his mother is  going to help him buy the prescriptions.  The patient  was discharged home.  Final Clinical Impressions(s) / ED Diagnoses   Final diagnoses:  Bronchitis with bronchospasm    ED Discharge Orders    None      Plan discharge  Devoria Albe, MD, Concha Pyo, MD 07/30/17 [phone]

## 2017-07-29 NOTE — ED Notes (Signed)
Pt's friend came out requesting drink for patient. Informed him patient has to be evaluated by EDP first. Verbalized understanding.

## 2017-07-30 LAB — CBC WITH DIFFERENTIAL/PLATELET
BASOS ABS: 0 10*3/uL (ref 0.0–0.1)
BASOS PCT: 0 %
EOS PCT: 2 %
Eosinophils Absolute: 0.2 10*3/uL (ref 0.0–0.7)
HCT: 40.8 % (ref 39.0–52.0)
Hemoglobin: 12.8 g/dL — ABNORMAL LOW (ref 13.0–17.0)
Lymphocytes Relative: 28 %
Lymphs Abs: 3.1 10*3/uL (ref 0.7–4.0)
MCH: 26.5 pg (ref 26.0–34.0)
MCHC: 31.4 g/dL (ref 30.0–36.0)
MCV: 84.5 fL (ref 78.0–100.0)
MONO ABS: 1.1 10*3/uL — AB (ref 0.1–1.0)
Monocytes Relative: 10 %
NEUTROS ABS: 6.7 10*3/uL (ref 1.7–7.7)
Neutrophils Relative %: 60 %
PLATELETS: 279 10*3/uL (ref 150–400)
RBC: 4.83 MIL/uL (ref 4.22–5.81)
RDW: 13.6 % (ref 11.5–15.5)
WBC: 11.1 10*3/uL — AB (ref 4.0–10.5)

## 2017-07-30 LAB — BASIC METABOLIC PANEL
Anion gap: 13 (ref 5–15)
BUN: 11 mg/dL (ref 6–20)
CALCIUM: 9.1 mg/dL (ref 8.9–10.3)
CO2: 23 mmol/L (ref 22–32)
Chloride: 103 mmol/L (ref 101–111)
Creatinine, Ser: 0.76 mg/dL (ref 0.61–1.24)
GFR calc Af Amer: >60 mL/min (ref >60)
GLUCOSE: 113 mg/dL — AB (ref 65–99)
POTASSIUM: 2.9 mmol/L — AB (ref 3.5–5.1)
SODIUM: 139 mmol/L (ref 135–145)

## 2017-07-30 MED ORDER — IPRATROPIUM BROMIDE 0.02 % IN SOLN
0.5000 mg | Freq: Once | RESPIRATORY_TRACT | Status: AC
  Administered 2017-07-30: 0.5 mg via RESPIRATORY_TRACT
  Filled 2017-07-30: qty 2.5

## 2017-07-30 MED ORDER — SODIUM CHLORIDE 0.9 % IV BOLUS (SEPSIS)
1000.0000 mL | Freq: Once | INTRAVENOUS | Status: AC
  Administered 2017-07-30: 1000 mL via INTRAVENOUS

## 2017-07-30 MED ORDER — MAGNESIUM SULFATE 2 GM/50ML IV SOLN
2.0000 g | Freq: Once | INTRAVENOUS | Status: AC
  Administered 2017-07-30: 2 g via INTRAVENOUS
  Filled 2017-07-30: qty 50

## 2017-07-30 MED ORDER — ALBUTEROL SULFATE (2.5 MG/3ML) 0.083% IN NEBU
5.0000 mg | INHALATION_SOLUTION | Freq: Once | RESPIRATORY_TRACT | Status: AC
  Administered 2017-07-30: 5 mg via RESPIRATORY_TRACT
  Filled 2017-07-30: qty 6

## 2017-07-30 MED ORDER — PREDNISONE 50 MG PO TABS
60.0000 mg | ORAL_TABLET | Freq: Once | ORAL | Status: AC
  Administered 2017-07-30: 60 mg via ORAL
  Filled 2017-07-30: qty 1

## 2017-07-30 MED ORDER — SODIUM CHLORIDE 0.9 % IV BOLUS (SEPSIS): 500.0000 mL | Freq: Once | INTRAVENOUS | Status: DC

## 2017-07-30 NOTE — ED Notes (Signed)
Elijah [name], NT ambulated pt on RA around the nurses station. Pt's o2 at the beginning was 98% and while pt was ambulating pt's o2 went up to 99-100% and remained that way. Pt had no complaints.

## 2017-07-30 NOTE — ED Notes (Addendum)
Pt given water 

## 2017-07-30 NOTE — ED Notes (Signed)
Pt has finished his bolus.

## 2017-07-30 NOTE — Discharge Instructions (Signed)
Please get all your prescriptions filled. Follow the instructions you were given last night.

## 2017-07-30 NOTE — ED Notes (Signed)
Pt still receiving 500 cc bolus

## 2017-07-31 LAB — CULTURE, GROUP A STREP (THRC)

## 2017-09-05 ENCOUNTER — Telehealth: Payer: Self-pay

## 2017-09-05 ENCOUNTER — Other Ambulatory Visit: Payer: Self-pay | Admitting: Family Medicine

## 2017-09-05 MED ORDER — AMOXICILLIN 500 MG PO TABS: 500.0000 mg | ORAL_TABLET | Freq: Three times a day (TID) | ORAL | 0 refills | Status: DC

## 2017-09-05 NOTE — Telephone Encounter (Signed)
Per your request open a phone message. Sister seen today for sore throat (strep) fever.[facility]).

## 2017-09-05 NOTE — Telephone Encounter (Signed)
The mom stated that the patient was having sore throat fever just like sister sister seen today had strep throat therefore we will recommend Elijah [name] takes amoxicillin 500 mg 1 3 times a day for 10 days if he has ongoing trouble he needs follow-up this was sent into [place]

## 2017-09-16 ENCOUNTER — Encounter (HOSPITAL_COMMUNITY): Payer: Self-pay | Admitting: *Deleted

## 2017-09-16 ENCOUNTER — Emergency Department (HOSPITAL_COMMUNITY)
Admission: EM | Admit: 2017-09-16 | Discharge: 2017-09-16 | Disposition: A | Discharge status: Home / Self Care | Payer: Medicaid Other | Attending: Emergency Medicine | Admitting: Emergency Medicine

## 2017-09-16 DIAGNOSIS — F4323 Adjustment disorder with mixed anxiety and depressed mood: Secondary | ICD-10-CM | POA: Insufficient documentation

## 2017-09-16 DIAGNOSIS — J069 Acute upper respiratory infection, unspecified: Secondary | ICD-10-CM | POA: Diagnosis not present

## 2017-09-16 DIAGNOSIS — F840 Autistic disorder: Secondary | ICD-10-CM | POA: Insufficient documentation

## 2017-09-16 DIAGNOSIS — F909 Attention-deficit hyperactivity disorder, unspecified type: Secondary | ICD-10-CM | POA: Insufficient documentation

## 2017-09-16 DIAGNOSIS — Z79899 Other long term (current) drug therapy: Secondary | ICD-10-CM | POA: Insufficient documentation

## 2017-09-16 DIAGNOSIS — J029 Acute pharyngitis, unspecified: Secondary | ICD-10-CM | POA: Diagnosis not present

## 2017-09-16 MED ORDER — DEXAMETHASONE 4 MG PO TABS: 4.0000 mg | ORAL_TABLET | Freq: Two times a day (BID) | ORAL | 0 refills | Status: DC

## 2017-09-16 MED ORDER — IBUPROFEN 600 MG PO TABS: 600.0000 mg | ORAL_TABLET | Freq: Four times a day (QID) | ORAL | 0 refills | Status: DC

## 2017-09-16 MED ORDER — DEXAMETHASONE SODIUM PHOSPHATE 10 MG/ML IJ SOLN
10.0000 mg | Freq: Once | INTRAMUSCULAR | Status: AC
  Administered 2017-09-16: 10 mg via INTRAMUSCULAR
  Filled 2017-09-16: qty 1

## 2017-09-16 MED ORDER — IBUPROFEN 800 MG PO TABS
800.0000 mg | ORAL_TABLET | Freq: Once | ORAL | Status: AC
  Administered 2017-09-16: 800 mg via ORAL
  Filled 2017-09-16: qty 1

## 2017-09-16 MED ORDER — ONDANSETRON HCL 4 MG PO TABS
4.0000 mg | ORAL_TABLET | Freq: Once | ORAL | Status: AC
  Administered 2017-09-16: 4 mg via ORAL
  Filled 2017-09-16: qty 1

## 2017-09-16 NOTE — ED Notes (Signed)
Pt reports was given a bottle of amoxicillin and took only 1/2 bottle because "it wasn't helping me"  Education: take entirety of Rx to prevent superinfections   Pt reports his throat is not sore but he feels his glands are swollen  He is clear voiced

## 2017-09-16 NOTE — ED Provider Notes (Signed)
[facility] EMERGENCY DEPARTMENT Provider Note   CSN: [id number] Arrival date & time: 09/16/17  1235     History   Chief Complaint Chief Complaint  Patient presents with  . Sore Throat    HPI Elijah [name] is a 22 y.o. male.  Patient is a 22 year old male who presents to the emergency department with a complaint of sore throat.  The patient states that approximately a week to 10 days ago he was bothered with a sore throat.  He states that he has family members who had strep, so he began to take the remainder of a partially taken prescription of penicillin.  He states that the pain now seems to be getting worse instead of getting better.  He is able to swallow food and get liquids down, but states he has pain with it.  He also complains of a full sensation in his ears and nasal congestion.  No high fevers reported.  No vomiting reported.  No diarrhea noted.  He attempted to see his primary physician, but states they could not see him at this time.  He presents now for assistance with this issue.      Past Medical History:  Diagnosis Date  . Acid reflux   . ADD (attention deficit disorder)   . ADHD (attention deficit hyperactivity disorder)   . Autism   . Learning disability     Patient Active Problem List   Diagnosis Date Noted  . GERD (gastroesophageal reflux disease) 03/09/2017  . Suicidal ideation 03/09/2017  . Adjustment disorder with mixed anxiety and depressed mood 03/09/2017  . Autism spectrum disorder 03/08/2017  . Intellectual disability 11/04/2016  . Aggressive behavior   . Auditory hallucinations   . Learning disability 12/21/2014  . ADD (attention deficit disorder) 11/24/2012  . Morbid obesity (HCC) 11/24/2012    History reviewed. No pertinent surgical history.     Home Medications    Prior to Admission medications   Medication Sig Start Date End Date Taking? Authorizing Provider  albuterol (PROVENTIL HFA;VENTOLIN HFA) 108 (90 Base) MCG/ACT  inhaler Inhale 2 puffs into the lungs every 6 (six) hours as needed for wheezing or shortness of breath. 07/27/17   Merlyn Albert, MD  albuterol (PROVENTIL HFA;VENTOLIN HFA) 108 (90 Base) MCG/ACT inhaler Inhale 2 puffs into the lungs every 4 (four) hours as needed. 07/29/17   Devoria Albe, MD  amoxicillin (AMOXIL) 500 MG tablet Take 1 tablet (500 mg total) by mouth 3 (three) times daily. 09/05/17   Babs Sciara, MD  amoxicillin-clavulanate (AUGMENTIN) 875-125 MG tablet Take 1 tablet by mouth 2 (two) times daily. 07/27/17   Merlyn Albert, MD  fluticasone (FLONASE) 50 MCG/ACT nasal spray Place 2 sprays into both nostrils daily. Prn head congestion 02/11/17   Loren Racer, MD  loratadine (CLARITIN) 10 MG tablet Take 1 tablet (10 mg total) by mouth daily. Patient not taking: Reported on 07/29/2017 02/11/17   Loren Racer, MD  pantoprazole (PROTONIX) 40 MG tablet Take 1 tablet (40 mg total) by mouth daily. 03/10/17   Pucilowska, Braulio Conte B, MD  predniSONE (DELTASONE) 20 MG tablet Take 3 po QD x 3d , then 2 po QD x 3d then 1 po QD x 3d 07/29/17   Devoria Albe, MD  sertraline (ZOLOFT) 50 MG tablet Take 1 tablet (50 mg total) by mouth daily. Patient not taking: Reported on 07/29/2017 06/19/17   Babs Sciara, MD    Family History History reviewed. No pertinent family history.  Social  History Social History   Tobacco Use  . Smoking status: Never Smoker  . Smokeless tobacco: Never Used  Substance Use Topics  . Alcohol use: Yes    Comment: occassional  . Drug use: No     Allergies   Patient has no known allergies.   Review of Systems Review of Systems  Constitutional: Negative for activity change.       All ROS Neg except as noted in HPI  HENT: Positive for congestion and sore throat. Negative for nosebleeds.   Eyes: Negative for photophobia and discharge.  Respiratory: Negative for cough, shortness of breath and wheezing.   Cardiovascular: Negative for chest pain and palpitations.    Gastrointestinal: Negative for abdominal pain and blood in stool.  Genitourinary: Negative for dysuria, frequency and hematuria.  Musculoskeletal: Negative for arthralgias, back pain and neck pain.  Skin: Negative.   Neurological: Negative for dizziness, seizures and speech difficulty.  Psychiatric/Behavioral: Negative for confusion and hallucinations.     Physical Exam Updated Vital Signs BP (!) 126/91 (BP Location: Right Arm)   Pulse (!) 101   Temp 98.1 F (36.7 C) (Oral)   Resp 18   Ht 5\' 5"  (1.651 m)   Wt 105.2 kg (232 lb)   SpO2 98%   BMI 38.61 kg/m   Physical Exam  Constitutional: He is oriented to person, place, and time. He appears well-developed and well-nourished.  Non-toxic appearance.  HENT:  Head: Normocephalic.  Right Ear: Tympanic membrane and external ear normal.  Left Ear: Tympanic membrane and external ear normal.  Eyes: EOM and lids are normal. Pupils are equal, round, and reactive to light.  Neck: Normal range of motion. Neck supple. Carotid bruit is not present.  Cardiovascular: Normal rate, regular rhythm, normal heart sounds, intact distal pulses and normal pulses.  Pulmonary/Chest: Breath sounds normal. No respiratory distress.  Abdominal: Soft. Bowel sounds are normal. There is no tenderness. There is no guarding.  Musculoskeletal: Normal range of motion.  Lymphadenopathy:       Head (right side): No submandibular adenopathy present.       Head (left side): No submandibular adenopathy present.    He has no cervical adenopathy.  Neurological: He is alert and oriented to person, place, and time. He has normal strength. No cranial nerve deficit or sensory deficit.  Skin: Skin is warm and dry.  Psychiatric: He has a normal mood and affect. His speech is normal.  Nursing note and vitals reviewed.    ED Treatments / Results  Labs (all labs ordered are listed, but only abnormal results are displayed) Labs Reviewed - No data to display  EKG  EKG  Interpretation None       Radiology No results found.  Procedures Procedures (including critical care time)  Medications Ordered in ED Medications  dexamethasone (DECADRON) injection 10 mg (not administered)  ibuprofen (ADVIL,MOTRIN) tablet 800 mg (not administered)  ondansetron (ZOFRAN) tablet 4 mg (not administered)     Initial Impression / Assessment and Plan / ED Course  I have reviewed the triage vital signs and the nursing notes.  Pertinent labs & imaging results that were available during my care of the patient were reviewed by me and considered in my medical decision making (see chart for details).       Final Clinical Impressions(s) / ED Diagnoses  MDM  Vital signs within normal limits.  I have reviewed the previous medical records, and strep cultures in January were negative for strep throat.  The  patient denies any high fevers, no nausea or vomiting, no abdominal pain.  I suspect that the patient has a viral pharyngitis.  The patient will be treated with salt water gargles, Chloraseptic spray, Decadron, and ibuprofen.  The patient will follow up with Dr. Holley Dexter care physician if not improving.   Final diagnoses:  Pharyngitis, unspecified etiology  Upper respiratory tract infection, unspecified type    ED Discharge Orders        Ordered    dexamethasone (DECADRON) 4 MG tablet  2 times daily with meals     09/16/17 1419    ibuprofen (ADVIL,MOTRIN) 600 MG tablet  4 times daily     09/16/17 1419       Ivery Quale, PA-C 09/16/17 1419    Donnetta Hutching, MD 09/17/17 [phone]

## 2017-09-16 NOTE — ED Notes (Signed)
Sore throat for several weeks  Has not sought tx with PCP

## 2017-09-16 NOTE — ED Triage Notes (Signed)
Pt with c/o sore throat for couple of weeks, c/o ear ache at times, occ cough.  Pt denies seeing anyone for this.

## 2017-09-16 NOTE — Discharge Instructions (Signed)
Please use salt water gargles 3 or 4 times daily.  Chloraseptic spray may be helpful with your discomfort.  Please use Decadron 2 times daily.  Use ibuprofen with breakfast, lunch, dinner, and at bedtime.  Please wash hands frequently.  Please increase fluids.  Please see Dr. Gerda DissLuking for additional evaluation and management if not improving.

## 2017-09-16 NOTE — ED Notes (Signed)
HB in to assess 

## 2017-09-16 NOTE — ED Notes (Signed)
Awaiting eval  

## 2017-09-20 ENCOUNTER — Encounter: Payer: Self-pay | Admitting: Family Medicine

## 2017-09-20 ENCOUNTER — Ambulatory Visit (INDEPENDENT_AMBULATORY_CARE_PROVIDER_SITE_OTHER): Payer: Medicaid Other | Admitting: Family Medicine

## 2017-09-20 VITALS — BP 124/80 | Temp 97.8°F | Ht 65.0 in | Wt 237.0 lb

## 2017-09-20 DIAGNOSIS — R1319 Other dysphagia: Secondary | ICD-10-CM

## 2017-09-20 DIAGNOSIS — K219 Gastro-esophageal reflux disease without esophagitis: Secondary | ICD-10-CM | POA: Diagnosis not present

## 2017-09-20 MED ORDER — ESOMEPRAZOLE MAGNESIUM 40 MG PO CPDR: 40.0000 mg | DELAYED_RELEASE_CAPSULE | Freq: Every day | ORAL | 3 refills | Status: DC

## 2017-09-20 NOTE — Progress Notes (Signed)
   Subjective:    Patient ID: Elijah [name], male    DOB: 08/14/1995, 22 y.o.   MRN: [id number]  HPI Follow up ED visit for pharyngitis. Taking dexamthasone 4mg  bid. Pt states med is not helping. Throat feels tight and burning. Trouble swallowing pills.  Burning in the throat discomfort.  Denies high fever chills sweats denies wheezing difficulty breathing states at times he feels like he cannot swallow as well because of the discomfort in his larynx region at times he feels like food gets stuck in the upper esophagus has not had to undergo any type of emergent procedure with this  Review of Systems  Constitutional: Negative for activity change, fatigue and fever.  HENT: Negative for congestion and rhinorrhea.   Respiratory: Negative for cough and shortness of breath.   Cardiovascular: Negative for chest pain and leg swelling.  Gastrointestinal: Negative for abdominal pain, diarrhea and nausea.  Genitourinary: Negative for dysuria and hematuria.  Neurological: Negative for weakness and headaches.  Psychiatric/Behavioral: Negative for behavioral problems.       Objective:   Physical Exam  Constitutional: He appears well-nourished. No distress.  HENT:  Head: Normocephalic and atraumatic.  Eyes: Right eye exhibits no discharge. Left eye exhibits no discharge.  Neck: No tracheal deviation present.  Cardiovascular: Normal rate, regular rhythm and normal heart sounds.  No murmur heard. Pulmonary/Chest: Effort normal and breath sounds normal. No respiratory distress. He has no wheezes.  Musculoskeletal: He exhibits no edema.  Lymphadenopathy:    He has no cervical adenopathy.  Neurological: He is alert.  Skin: Skin is warm. No rash noted.  Psychiatric: His behavior is normal.  Vitals reviewed.   No masses in the neck throat appears normal      Assessment & Plan:  Probable reflux related issues referral to gastroenterology may need to have recommend different PPI will use  Nexium Avoid foods that trigger reflux If progressive symptoms or if worse follow-up

## 2017-09-20 NOTE — Patient Instructions (Signed)
For now stick with soft foods- make sure you chew your food carefully before swallowing.  Drink plenty of liquids with your meals.   I do not find any evidence of a throat infection.  I do not recommend antibiotics.  I do not feel that you need to continue taking dexamethasone.  You may stop this medication.  We are switching your acid reflux medicine.  Stop Protonix.  You will start taking generic Nexium daily.  In addition to this we are referring you to gastroenterology for further evaluation.  More than likely they will proposed doing a scope test to look down your esophagus.  It can take a few weeks to get in with gastroenterology.  If you feel things are getting worse please call us back.

## 2017-09-25 ENCOUNTER — Encounter: Payer: Self-pay | Admitting: Gastroenterology
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
## 2017-09-29 ENCOUNTER — Ambulatory Visit (HOSPITAL_COMMUNITY): Payer: Self-pay | Admitting: Psychiatry

## 2017-11-16 ENCOUNTER — Encounter: Payer: Self-pay | Admitting: Gastroenterology

## 2017-11-16 ENCOUNTER — Telehealth: Payer: Self-pay | Admitting: *Deleted

## 2017-11-16 ENCOUNTER — Encounter: Payer: Self-pay | Admitting: *Deleted

## 2017-11-16 ENCOUNTER — Other Ambulatory Visit: Payer: Self-pay | Admitting: *Deleted

## 2017-11-16 ENCOUNTER — Ambulatory Visit: Payer: Medicaid Other | Admitting: Gastroenterology

## 2017-11-16 VITALS — BP 128/81 | HR 94 | Temp 98.1°F | Ht 65.0 in | Wt 244.8 lb

## 2017-11-16 DIAGNOSIS — K2100 Gastro-esophageal reflux disease with esophagitis, without bleeding: Secondary | ICD-10-CM

## 2017-11-16 DIAGNOSIS — K625 Hemorrhage of anus and rectum: Secondary | ICD-10-CM | POA: Diagnosis not present

## 2017-11-16 DIAGNOSIS — R1310 Dysphagia, unspecified: Secondary | ICD-10-CM

## 2017-11-16 DIAGNOSIS — K5900 Constipation, unspecified: Secondary | ICD-10-CM

## 2017-11-16 DIAGNOSIS — R1319 Other dysphagia: Secondary | ICD-10-CM

## 2017-11-16 DIAGNOSIS — K219 Gastro-esophageal reflux disease without esophagitis: Secondary | ICD-10-CM | POA: Diagnosis not present

## 2017-11-16 MED ORDER — LINACLOTIDE 72 MCG PO CAPS: 72.0000 ug | ORAL_CAPSULE | Freq: Every day | ORAL | 3 refills | Status: DC

## 2017-11-16 NOTE — Progress Notes (Signed)
Primary Care Physician:  Babs Sciara, MD  Primary Gastroenterologist:  Roetta Sessions, MD   Chief Complaint  Patient presents with  . Gastroesophageal Reflux  . Dysphagia    HPI:  Elijah [name] is a 22 y.o. male here at the request of Dr. Lilyan Punt for further evaluation of acid reflux, dysphagia. Patient describes several months of difficulty with acid reflux. He notes typical heartburn and regurgitation when he is out in the yard bending over and working. Most worrisome symptoms to him our nocturnal symptoms that wake him up at night. He wakes up with discomfort in the center of his chest associated with choking/coughing, throat feels tight. Typically will take Nexium when this occurs. Sometimes takes TUMS as well. He notes during the day he has problems swallowing solid foods, sometimes the food comes back up.  Somewhat difficult historian. Initially he told us that he was not taking the Nexium because he couldn't find it since he moved.Then he told me he was taking it every night when he woke up with symptoms which is basically every night of the week.And ultimately he told me doesn't take it all the time. We have requested records from the pharmacy to help sort this out.  He also complains of difficulty with his bowel movements. He has a BM every day sometimes passes only small pieces. He notes rectal burning and bright red blood on the toilet tissue if he strains a lot or has to work a lot.  States he is out of his ADHD medications and has to get back in to see his provider for those.   Current Outpatient Medications  Medication Sig Dispense Refill  . albuterol (PROVENTIL HFA;VENTOLIN HFA) 108 (90 Base) MCG/ACT inhaler Inhale 2 puffs into the lungs every 6 (six) hours as needed for wheezing or shortness of breath. 1 Inhaler 0  . albuterol (PROVENTIL HFA;VENTOLIN HFA) 108 (90 Base) MCG/ACT inhaler Inhale 2 puffs into the lungs every 4 (four) hours as needed. 6.7 g 0  .       .  esomeprazole (NEXIUM) 40 MG capsule Take 40 mg by mouth at bedtime.    . fluticasone (FLONASE) 50 MCG/ACT nasal spray Place 2 sprays into both nostrils daily. Prn head congestion (Patient taking differently: Place 2 sprays into both nostrils as needed. Prn head congestion) 16 g 5  . loratadine (CLARITIN) 10 MG tablet Take 1 tablet (10 mg total) by mouth daily. 30 tablet 0  . sertraline (ZOLOFT) 50 MG tablet Take 1 tablet (50 mg total) by mouth daily. 30 tablet 0   No current facility-administered medications for this visit.     Allergies as of 11/16/2017  . (No Known Allergies)    Past Medical History:  Diagnosis Date  . Acid reflux   . ADD (attention deficit disorder)   . ADHD (attention deficit hyperactivity disorder)   . Autism   . Learning disability     History reviewed. No pertinent surgical history.  NO SURGERIES PER PATIENT  Family History  Problem Relation Age of Onset  . Obesity Mother        gastric sleeve  . Colon cancer Neg Hx     Social History   Socioeconomic History  . Marital status: Single    Spouse name: Not on file  . Number of children: Not on file  . Years of education: Not on file  . Highest education level: Not on file  Occupational History  . Not on file  Social Needs  . Financial resource strain: Not on file  . Food insecurity:    Worry: Not on file    Inability: Not on file  . Transportation needs:    Medical: Not on file    Non-medical: Not on file  Tobacco Use  . Smoking status: Never Smoker  . Smokeless tobacco: Never Used  Substance and Sexual Activity  . Alcohol use: Yes    Comment: occassional  . Drug use: No  . Sexual activity: Not on file  Lifestyle  . Physical activity:    Days per week: Not on file    Minutes per session: Not on file  . Stress: Not on file  Relationships  . Social connections:    Talks on phone: Not on file    Gets together: Not on file    Attends religious service: Not on file    Active member of  club or organization: Not on file    Attends meetings of clubs or organizations: Not on file    Relationship status: Not on file  . Intimate partner violence:    Fear of current or ex partner: Not on file    Emotionally abused: Not on file    Physically abused: Not on file    Forced sexual activity: Not on file  Other Topics Concern  . Not on file  Social History Narrative  . Not on file      ROS:  General: Negative for anorexia, weight loss, fever, chills, fatigue, weakness. Eyes: Negative for vision changes.  ENT: Negative for hoarseness, nasal congestion. SEE HPI CV: Negative for chest pain, angina, palpitations, dyspnea on exertion, peripheral edema.  Respiratory: Negative for dyspnea at rest, dyspnea on exertion, cough, sputum, wheezing.  GI: See history of present illness. GU:  Negative for dysuria, hematuria, urinary incontinence, urinary frequency, nocturnal urination.  MS: Negative for joint pain, low back pain.  Derm: Negative for rash or itching.  Neuro: Negative for weakness, abnormal sensation, seizure, frequent headaches, memory loss, confusion.  Psych: Negative for anxiety, depression, suicidal ideation, hallucinations.  Endo: Negative for unusual weight change.  Heme: Negative for bruising or bleeding. Allergy: Negative for rash or hives.    Physical Examination:  BP 128/81   Pulse 94   Temp 98.1 F (36.7 C) (Oral)   Ht 5\' 5"  (1.651 m)   Wt 244 lb 12.8 oz (111 kg)   BMI 40.74 kg/m    General: Well-nourished, well-developed in no acute distress.  Head: Normocephalic, atraumatic.   Eyes: Conjunctiva pink, no icterus. Mouth: Oropharyngeal mucosa moist and pink , no lesions erythema or exudate. Neck: Supple without thyromegaly, masses, or lymphadenopathy.  Lungs: Clear to auscultation bilaterally.  Heart: Regular rate and rhythm, no murmurs rubs or gallops.  Abdomen: Bowel sounds are normal, nontender, nondistended, no hepatosplenomegaly or masses, no  abdominal bruits or    hernia , no rebound or guarding.   Rectal: not performed Extremities: No lower extremity edema. No clubbing or deformities.  Neuro: Alert and oriented x 4 , grossly normal neurologically.  Skin: Warm and dry, no rash or jaundice.   Psych: Alert and cooperative, normal mood and affect.  Labs: Lab Results  Component Value Date   CREATININE 0.76 07/30/2017   BUN 11 07/30/2017   NA 139 07/30/2017   K 2.9 (L) 07/30/2017   CL 103 07/30/2017   CO2 23 07/30/2017   Lab Results  Component Value Date   WBC 11.1 (H) 07/30/2017   HGB  12.8 (L) 07/30/2017   HCT 40.8 07/30/2017   MCV 84.5 07/30/2017   PLT 279 07/30/2017     Imaging Studies: No results found.

## 2017-11-16 NOTE — Patient Instructions (Signed)
1. Upper endoscopy as scheduled. See separate instructions.  2. Take esomeprazole (acid reflux) capsule 30-60 minutes before you have supper every night.  3. Start Linzess once daily on empty stomach for constipation. 4. Return to the office in 3 months for follow up.

## 2017-11-16 NOTE — H&P (View-Only) (Signed)
Primary Care Physician:  Babs Sciara, MD  Primary Gastroenterologist:  Roetta Sessions, MD   Chief Complaint  Patient presents with  . Gastroesophageal Reflux  . Dysphagia    HPI:  Elijah [name] is a 22 y.o. male here at the request of Dr. Lilyan Punt for further evaluation of acid reflux, dysphagia. Patient describes several months of difficulty with acid reflux. He notes typical heartburn and regurgitation when he is out in the yard bending over and working. Most worrisome symptoms to him our nocturnal symptoms that wake him up at night. He wakes up with discomfort in the center of his chest associated with choking/coughing, throat feels tight. Typically will take Nexium when this occurs. Sometimes takes TUMS as well. He notes during the day he has problems swallowing solid foods, sometimes the food comes back up.  Somewhat difficult historian. Initially he told us that he was not taking the Nexium because he couldn't find it since he moved.Then he told me he was taking it every night when he woke up with symptoms which is basically every night of the week.And ultimately he told me doesn't take it all the time. We have requested records from the pharmacy to help sort this out.  He also complains of difficulty with his bowel movements. He has a BM every day sometimes passes only small pieces. He notes rectal burning and bright red blood on the toilet tissue if he strains a lot or has to work a lot.  States he is out of his ADHD medications and has to get back in to see his provider for those.   Current Outpatient Medications  Medication Sig Dispense Refill  . albuterol (PROVENTIL HFA;VENTOLIN HFA) 108 (90 Base) MCG/ACT inhaler Inhale 2 puffs into the lungs every 6 (six) hours as needed for wheezing or shortness of breath. 1 Inhaler 0  . albuterol (PROVENTIL HFA;VENTOLIN HFA) 108 (90 Base) MCG/ACT inhaler Inhale 2 puffs into the lungs every 4 (four) hours as needed. 6.7 g 0  .       .  esomeprazole (NEXIUM) 40 MG capsule Take 40 mg by mouth at bedtime.    . fluticasone (FLONASE) 50 MCG/ACT nasal spray Place 2 sprays into both nostrils daily. Prn head congestion (Patient taking differently: Place 2 sprays into both nostrils as needed. Prn head congestion) 16 g 5  . loratadine (CLARITIN) 10 MG tablet Take 1 tablet (10 mg total) by mouth daily. 30 tablet 0  . sertraline (ZOLOFT) 50 MG tablet Take 1 tablet (50 mg total) by mouth daily. 30 tablet 0   No current facility-administered medications for this visit.     Allergies as of 11/16/2017  . (No Known Allergies)    Past Medical History:  Diagnosis Date  . Acid reflux   . ADD (attention deficit disorder)   . ADHD (attention deficit hyperactivity disorder)   . Autism   . Learning disability     History reviewed. No pertinent surgical history.  NO SURGERIES PER PATIENT  Family History  Problem Relation Age of Onset  . Obesity Mother        gastric sleeve  . Colon cancer Neg Hx     Social History   Socioeconomic History  . Marital status: Single    Spouse name: Not on file  . Number of children: Not on file  . Years of education: Not on file  . Highest education level: Not on file  Occupational History  . Not on file  Social Needs  . Financial resource strain: Not on file  . Food insecurity:    Worry: Not on file    Inability: Not on file  . Transportation needs:    Medical: Not on file    Non-medical: Not on file  Tobacco Use  . Smoking status: Never Smoker  . Smokeless tobacco: Never Used  Substance and Sexual Activity  . Alcohol use: Yes    Comment: occassional  . Drug use: No  . Sexual activity: Not on file  Lifestyle  . Physical activity:    Days per week: Not on file    Minutes per session: Not on file  . Stress: Not on file  Relationships  . Social connections:    Talks on phone: Not on file    Gets together: Not on file    Attends religious service: Not on file    Active member of  club or organization: Not on file    Attends meetings of clubs or organizations: Not on file    Relationship status: Not on file  . Intimate partner violence:    Fear of current or ex partner: Not on file    Emotionally abused: Not on file    Physically abused: Not on file    Forced sexual activity: Not on file  Other Topics Concern  . Not on file  Social History Narrative  . Not on file      ROS:  General: Negative for anorexia, weight loss, fever, chills, fatigue, weakness. Eyes: Negative for vision changes.  ENT: Negative for hoarseness, nasal congestion. SEE HPI CV: Negative for chest pain, angina, palpitations, dyspnea on exertion, peripheral edema.  Respiratory: Negative for dyspnea at rest, dyspnea on exertion, cough, sputum, wheezing.  GI: See history of present illness. GU:  Negative for dysuria, hematuria, urinary incontinence, urinary frequency, nocturnal urination.  MS: Negative for joint pain, low back pain.  Derm: Negative for rash or itching.  Neuro: Negative for weakness, abnormal sensation, seizure, frequent headaches, memory loss, confusion.  Psych: Negative for anxiety, depression, suicidal ideation, hallucinations.  Endo: Negative for unusual weight change.  Heme: Negative for bruising or bleeding. Allergy: Negative for rash or hives.    Physical Examination:  BP 128/81   Pulse 94   Temp 98.1 F (36.7 C) (Oral)   Ht 5\' 5"  (1.651 m)   Wt 244 lb 12.8 oz (111 kg)   BMI 40.74 kg/m    General: Well-nourished, well-developed in no acute distress.  Head: Normocephalic, atraumatic.   Eyes: Conjunctiva pink, no icterus. Mouth: Oropharyngeal mucosa moist and pink , no lesions erythema or exudate. Neck: Supple without thyromegaly, masses, or lymphadenopathy.  Lungs: Clear to auscultation bilaterally.  Heart: Regular rate and rhythm, no murmurs rubs or gallops.  Abdomen: Bowel sounds are normal, nontender, nondistended, no hepatosplenomegaly or masses, no  abdominal bruits or    hernia , no rebound or guarding.   Rectal: not performed Extremities: No lower extremity edema. No clubbing or deformities.  Neuro: Alert and oriented x 4 , grossly normal neurologically.  Skin: Warm and dry, no rash or jaundice.   Psych: Alert and cooperative, normal mood and affect.  Labs: Lab Results  Component Value Date   CREATININE 0.76 07/30/2017   BUN 11 07/30/2017   NA 139 07/30/2017   K 2.9 (L) 07/30/2017   CL 103 07/30/2017   CO2 23 07/30/2017   Lab Results  Component Value Date   WBC 11.1 (H) 07/30/2017   HGB  12.8 (L) 07/30/2017   HCT 40.8 07/30/2017   MCV 84.5 07/30/2017   PLT 279 07/30/2017     Imaging Studies: No results found.

## 2017-11-16 NOTE — Telephone Encounter (Signed)
LMOVM. Pre-op scheduled for 11/30/17 at 1:00pm. Letter mailed.

## 2017-11-17 DIAGNOSIS — R1310 Dysphagia, unspecified: Secondary | ICD-10-CM | POA: Insufficient documentation

## 2017-11-17 DIAGNOSIS — K5900 Constipation, unspecified: Secondary | ICD-10-CM | POA: Insufficient documentation

## 2017-11-17 DIAGNOSIS — R1319 Other dysphagia: Secondary | ICD-10-CM | POA: Insufficient documentation

## 2017-11-17 DIAGNOSIS — K625 Hemorrhage of anus and rectum: Secondary | ICD-10-CM | POA: Insufficient documentation

## 2017-11-17 NOTE — Progress Notes (Signed)
CC'ED TO PCP 

## 2017-11-17 NOTE — Assessment & Plan Note (Signed)
22 year old gentleman presenting today for further evaluation of refractory reflux, solid food dysphagia.  He has daytime heartburn but gets woken up at night frequently with regurgitation/coughing/turning in his throat.  Typically takes his Nexium after symptoms began rather than as preventative however he tells me also takes it every time he wakes up with symptoms and this occurs most nights of the week.  Really has not noted any benefit.  Complains of solid food dysphagia for several months.  Would offer upper endoscopy with deep sedation given refractory GERD, dysphagia.  Symptoms may be secondary to complicated GERD but cannot exclude eosinophilic esophagitis.  I have discussed the risks, alternatives, benefits with regards to but not limited to the risk of reaction to medication, bleeding, infection, perforation and the patient is agreeable to proceed. Written consent to be obtained.  Encouraged him to take his Nexium 30 to 60 minutes before his supper every day.

## 2017-11-17 NOTE — Assessment & Plan Note (Signed)
Mild constipation with rare rectal bleeding.  Usually associated with hard stool or if he has been on his feet for long time.  Possibly related to benign anal rectal source such as hemorrhoids.  We will manage his constipation.  He will return in a few months to reevaluate.  Will not exclude possibility of colonoscopy in the near future.  Start Linzess 72 mcg daily on an empty stomach.

## 2017-11-23 ENCOUNTER — Inpatient Hospital Stay (HOSPITAL_COMMUNITY): Admission: RE | Admit: 2017-11-23 | Payer: Self-pay | Source: Ambulatory Visit

## 2017-11-23 NOTE — Patient Instructions (Signed)
Elijah [name]  11/23/2017     @   Your procedure is scheduled on  11/30/2017 .  Report to [facility] at  1100   A.M.  Call this number if you have problems the morning of surgery:  [phone]   Remember:  Do not eat food or drink liquids after midnight.  Take these medicines the morning of surgery with A SIP OF WATER  Claritin, zoloft. Use your inhaler before you come and bring it with you.   Do not wear jewelry, make-up or nail polish.  Do not wear lotions, powders, or perfumes, or deodorant.  Do not shave 48 hours prior to surgery.  Men may shave face and neck.  Do not bring valuables to the hospital.  [facility] is not responsible for any belongings or valuables.  Contacts, dentures or bridgework may not be worn into surgery.  Leave your suitcase in the car.  After surgery it may be brought to your room.  For patients admitted to the hospital, discharge time will be determined by your treatment team.  Patients discharged the day of surgery will not be allowed to drive home.   Name and phone number of your driver:   family Special instructions:  Follow the diet instructions given to you by Dr Luvenia Starch office.  Please read over the following fact sheets that you were given. Anesthesia Post-op Instructions and Care and Recovery After Surgery       Esophagogastroduodenoscopy Esophagogastroduodenoscopy (EGD) is a procedure to examine the lining of the esophagus, stomach, and first part of the small intestine (duodenum). This procedure is done to check for problems such as inflammation, bleeding, ulcers, or growths. During this procedure, a long, flexible, lighted tube with a camera attached (endoscope) is inserted down the throat. Tell a health care provider about:  Any allergies you have.  All medicines you are taking, including vitamins, herbs, eye drops, creams, and over-the-counter medicines.  Any problems you or family  members have had with anesthetic medicines.  Any blood disorders you have.  Any surgeries you have had.  Any medical conditions you have.  Whether you are pregnant or may be pregnant. What are the risks? Generally, this is a safe procedure. However, problems may occur, including:  Infection.  Bleeding.  A tear (perforation) in the esophagus, stomach, or duodenum.  Trouble breathing.  Excessive sweating.  Spasms of the larynx.  A slowed heartbeat.  Low blood pressure.  What happens before the procedure?  Follow instructions from your health care provider about eating or drinking restrictions.  Ask your health care provider about: ? Changing or stopping your regular medicines. This is especially important if you are taking diabetes medicines or blood thinners. ? Taking medicines such as aspirin and ibuprofen. These medicines can thin your blood. Do not take these medicines before your procedure if your health care provider instructs you not to.  Plan to have someone take you home after the procedure.  If you wear dentures, be ready to remove them before the procedure. What happens during the procedure?  To reduce your risk of infection, your health care team will wash or sanitize their hands.  An IV tube will be put in a vein in your hand or arm. You will get medicines and fluids through this tube.  You will be given one or more of the following: ? A medicine to help you relax (sedative). ? A  medicine to numb the area (local anesthetic). This medicine may be sprayed into your throat. It will make you feel more comfortable and keep you from gagging or coughing during the procedure. ? A medicine for pain.  A mouth guard may be placed in your mouth to protect your teeth and to keep you from biting on the endoscope.  You will be asked to lie on your left side.  The endoscope will be lowered down your throat into your esophagus, stomach, and duodenum.  Air will be put  into the endoscope. This will help your health care provider see better.  The lining of your esophagus, stomach, and duodenum will be examined.  Your health care provider may: ? Take a tissue sample so it can be looked at in a lab (biopsy). ? Remove growths. ? Remove objects (foreign bodies) that are stuck. ? Treat any bleeding with medicines or other devices that stop tissue from bleeding. ? Widen (dilate) or stretch narrowed areas of your esophagus and stomach.  The endoscope will be taken out. The procedure may vary among health care providers and hospitals. What happens after the procedure?  Your blood pressure, heart rate, breathing rate, and blood oxygen level will be monitored often until the medicines you were given have worn off.  Do not eat or drink anything until the numbing medicine has worn off and your gag reflex has returned. This information is not intended to replace advice given to you by your health care provider. Make sure you discuss any questions you have with your health care provider. Document Released: 11/11/2004 Document Revised: 12/17/2015 Document Reviewed: 06/04/2015 Elsevier Interactive Patient Education  2018 [employer]. Esophagogastroduodenoscopy, Care After Refer to this sheet in the next few weeks. These instructions provide you with information about caring for yourself after your procedure. Your health care provider may also give you more specific instructions. Your treatment has been planned according to current medical practices, but problems sometimes occur. Call your health care provider if you have any problems or questions after your procedure. What can I expect after the procedure? After the procedure, it is common to have:  A sore throat.  Nausea.  Bloating.  Dizziness.  Fatigue.  Follow these instructions at home:  Do not eat or drink anything until the numbing medicine (local anesthetic) has worn off and your gag reflex has  returned. You will know that the local anesthetic has worn off when you can swallow comfortably.  Do not drive for 24 hours if you received a medicine to help you relax (sedative).  If your health care provider took a tissue sample for testing during the procedure, make sure to get your test results. This is your responsibility. Ask your health care provider or the department performing the test when your results will be ready.  Keep all follow-up visits as told by your health care provider. This is important. Contact a health care provider if:  You cannot stop coughing.  You are not urinating.  You are urinating less than usual. Get help right away if:  You have trouble swallowing.  You cannot eat or drink.  You have throat or chest pain that gets worse.  You are dizzy or light-headed.  You faint.  You have nausea or vomiting.  You have chills.  You have a fever.  You have severe abdominal pain.  You have black, tarry, or bloody stools. This information is not intended to replace advice given to you by your health care  provider. Make sure you discuss any questions you have with your health care provider. Document Released: 06/27/2012 Document Revised: 12/17/2015 Document Reviewed: 06/04/2015 Elsevier Interactive Patient Education  2018 [employer].  Esophageal Dilatation Esophageal dilatation is a procedure to open a blocked or narrowed part of the esophagus. The esophagus is the long tube in your throat that carries food and liquid from your mouth to your stomach. The procedure is also called esophageal dilation. You may need this procedure if you have a buildup of scar tissue in your esophagus that makes it difficult, painful, or even impossible to swallow. This can be caused by gastroesophageal reflux disease (GERD). In rare cases, people need this procedure because they have cancer of the esophagus or a problem with the way food moves through the esophagus. Sometimes  you may need to have another dilatation to enlarge the opening of the esophagus gradually. Tell a health care provider about:  Any allergies you have.  All medicines you are taking, including vitamins, herbs, eye drops, creams, and over-the-counter medicines.  Any problems you or family members have had with anesthetic medicines.  Any blood disorders you have.  Any surgeries you have had.  Any medical conditions you have.  Any antibiotic medicines you are required to take before dental procedures. What are the risks? Generally, this is a safe procedure. However, problems can occur and include:  Bleeding from a tear in the lining of the esophagus.  A hole (perforation) in the esophagus.  What happens before the procedure?  Do not eat or drink anything after midnight on the night before the procedure or as directed by your health care provider.  Ask your health care provider about changing or stopping your regular medicines. This is especially important if you are taking diabetes medicines or blood thinners.  Plan to have someone take you home after the procedure. What happens during the procedure?  You will be given a medicine that makes you relaxed and sleepy (sedative).  A medicine may be sprayed or gargled to numb the back of the throat.  Your health care provider can use various instruments to do an esophageal dilatation. During the procedure, the instrument used will be placed in your mouth and passed down into your esophagus. Options include: ? Simple dilators. This instrument is carefully placed in the esophagus to stretch it. ? Guided wire bougies. In this method, a flexible tube (endoscope) is used to insert a wire into the esophagus. The dilator is passed over this wire to enlarge the esophagus. Then the wire is removed. ? Balloon dilators. An endoscope with a small balloon at the end is passed down into the esophagus. Inflating the balloon gently stretches the  esophagus and opens it up. What happens after the procedure?  Your blood pressure, heart rate, breathing rate, and blood oxygen level will be monitored often until the medicines you were given have worn off.  Your throat may feel slightly sore and will probably still feel numb. This will improve slowly over time.  You will not be allowed to eat or drink until the throat numbness has resolved.  If this is a same-day procedure, you may be allowed to go home once you have been able to drink, urinate, and sit on the edge of the bed without nausea or dizziness.  If this is a same-day procedure, you should have a friend or family member with you for the next 24 hours after the procedure. This information is not intended to replace  advice given to you by your health care provider. Make sure you discuss any questions you have with your health care provider. Document Released: 09/01/2005 Document Revised: 12/17/2015 Document Reviewed: 11/20/2013 Elsevier Interactive Patient Education  2018 Else[employer] Inc[employer]  Monitored Anesthesia Care Anesthesia is a term that refers to techniques, procedures, and medicines that help a person stay safe and comfortable during a medical procedure. Monitored anesthesia care, or sedation, is one type of anesthesia. Your anesthesia specialist may recommend sedation if you will be having a procedure that does not require you to be unconscious, such as:  Cataract surgery.  A dental procedure.  A biopsy.  A colonoscopy.  During the procedure, you may receive a medicine to help you relax (sedative). There are three levels of sedation:  Mild sedation. At this level, you may feel awake and relaxed. You will be able to follow directions.  Moderate sedation. At this level, you will be sleepy. You may not remember the procedure.  Deep sedation. At this level, you will be asleep. You will not remember the procedure.  The more medicine you are given, the deeper your level of  sedation will be. Depending on how you respond to the procedure, the anesthesia specialist may change your level of sedation or the type of anesthesia to fit your needs. An anesthesia specialist will monitor you closely during the procedure. Let your health care provider know about:  Any allergies you have.  All medicines you are taking, including vitamins, herbs, eye drops, creams, and over-the-counter medicines.  Any use of steroids (by mouth or as a cream).  Any problems you or family members have had with sedatives and anesthetic medicines.  Any blood disorders you have.  Any surgeries you have had.  Any medical conditions you have, such as sleep apnea.  Whether you are pregnant or may be pregnant.  Any use of cigarettes, alcohol, or street drugs. What are the risks? Generally, this is a safe procedure. However, problems may occur, including:  Getting too much medicine (oversedation).  Nausea.  Allergic reaction to medicines.  Trouble breathing. If this happens, a breathing tube may be used to help with breathing. It will be removed when you are awake and breathing on your own.  Heart trouble.  Lung trouble.  Before the procedure Staying hydrated Follow instructions from your health care provider about hydration, which may include:  Up to 2 hours before the procedure - you may continue to drink clear liquids, such as water, clear fruit juice, black coffee, and plain tea.  Eating and drinking restrictions Follow instructions from your health care provider about eating and drinking, which may include:  8 hours before the procedure - stop eating heavy meals or foods such as meat, fried foods, or fatty foods.  6 hours before the procedure - stop eating light meals or foods, such as toast or cereal.  6 hours before the procedure - stop drinking milk or drinks that contain milk.  2 hours before the procedure - stop drinking clear liquids.  Medicines Ask your health  care provider about:  Changing or stopping your regular medicines. This is especially important if you are taking diabetes medicines or blood thinners.  Taking medicines such as aspirin and ibuprofen. These medicines can thin your blood. Do not take these medicines before your procedure if your health care provider instructs you not to.  Tests and exams  You will have a physical exam.  You may have blood tests done to show: ?  How well your kidneys and liver are working. ? How well your blood can clot.  General instructions  Plan to have someone take you home from the hospital or clinic.  If you will be going home right after the procedure, plan to have someone with you for 24 hours.  What happens during the procedure?  Your blood pressure, heart rate, breathing, level of pain and overall condition will be monitored.  An IV tube will be inserted into one of your veins.  Your anesthesia specialist will give you medicines as needed to keep you comfortable during the procedure. This may mean changing the level of sedation.  The procedure will be performed. After the procedure  Your blood pressure, heart rate, breathing rate, and blood oxygen level will be monitored until the medicines you were given have worn off.  Do not drive for 24 hours if you received a sedative.  You may: ? Feel sleepy, clumsy, or nauseous. ? Feel forgetful about what happened after the procedure. ? Have a sore throat if you had a breathing tube during the procedure. ? Vomit. This information is not intended to replace advice given to you by your health care provider. Make sure you discuss any questions you have with your health care provider. Document Released: 04/06/2005 Document Revised: 12/18/2015 Document Reviewed: 11/01/2015 Elsevier Interactive Patient Education  2018 [employer] Monitored Anesthesia Care, Care After These instructions provide you with information about caring for yourself  after your procedure. Your health care provider may also give you more specific instructions. Your treatment has been planned according to current medical practices, but problems sometimes occur. Call your health care provider if you have any problems or questions after your procedure. What can I expect after the procedure? After your procedure, it is common to:  Feel sleepy for several hours.  Feel clumsy and have poor balance for several hours.  Feel forgetful about what happened after the procedure.  Have poor judgment for several hours.  Feel nauseous or vomit.  Have a sore throat if you had a breathing tube during the procedure.  Follow these instructions at home: For at least 24 hours after the procedure:   Do not: ? Participate in activities in which you could fall or become injured. ? Drive. ? Use heavy machinery. ? Drink alcohol. ? Take sleeping pills or medicines that cause drowsiness. ? Make important decisions or sign legal documents. ? Take care of children on your own.  Rest. Eating and drinking  Follow the diet that is recommended by your health care provider.  If you vomit, drink water, juice, or soup when you can drink without vomiting.  Make sure you have little or no nausea before eating solid foods. General instructions  Have a responsible adult stay with you until you are awake and alert.  Take over-the-counter and prescription medicines only as told by your health care provider.  If you smoke, do not smoke without supervision.  Keep all follow-up visits as told by your health care provider. This is important. Contact a health care provider if:  You keep feeling nauseous or you keep vomiting.  You feel light-headed.  You develop a rash.  You have a fever. Get help right away if:  You have trouble breathing. This information is not intended to replace advice given to you by your health care provider. Make sure you discuss any questions you  have with your health care provider. Document Released: 11/01/2015 Document Revised: 03/02/2016 Document Reviewed: 11/01/2015  Elsevier Interactive Patient Education  2018 Elsevier Inc.  

## 2017-11-27 ENCOUNTER — Other Ambulatory Visit: Payer: Self-pay

## 2017-11-27 ENCOUNTER — Encounter (HOSPITAL_COMMUNITY): Payer: Self-pay

## 2017-11-27 ENCOUNTER — Encounter (HOSPITAL_COMMUNITY)
Admission: RE | Admit: 2017-11-27 | Discharge: 2017-11-27 | Disposition: A | Discharge status: Home / Self Care | Payer: Medicaid Other | Source: Ambulatory Visit | Attending: Internal Medicine | Admitting: Internal Medicine

## 2017-11-27 DIAGNOSIS — Z01812 Encounter for preprocedural laboratory examination: Secondary | ICD-10-CM | POA: Insufficient documentation

## 2017-11-27 HISTORY — DX: Anxiety disorder, unspecified: F41.9

## 2017-11-27 HISTORY — DX: Mild intellectual disabilities: F70

## 2017-11-27 LAB — CBC
HEMATOCRIT: 41 % (ref 39.0–52.0)
HEMOGLOBIN: 13.1 g/dL (ref 13.0–17.0)
MCH: 26.7 pg (ref 26.0–34.0)
MCHC: 32 g/dL (ref 30.0–36.0)
MCV: 83.7 fL (ref 78.0–100.0)
Platelets: 299 10*3/uL (ref 150–400)
RBC: 4.9 MIL/uL (ref 4.22–5.81)
RDW: 13.5 % (ref 11.5–15.5)
WBC: 10.3 10*3/uL (ref 4.0–10.5)

## 2017-11-30 ENCOUNTER — Encounter (HOSPITAL_COMMUNITY): Payer: Self-pay | Admitting: *Deleted

## 2017-11-30 ENCOUNTER — Ambulatory Visit (HOSPITAL_COMMUNITY): Payer: Medicaid Other | Admitting: Anesthesiology

## 2017-11-30 ENCOUNTER — Ambulatory Visit (HOSPITAL_COMMUNITY)
Admission: RE | Admit: 2017-11-30 | Discharge: 2017-11-30 | Disposition: A | Discharge status: Home / Self Care | Payer: Medicaid Other | Source: Ambulatory Visit | Attending: Internal Medicine | Admitting: Internal Medicine

## 2017-11-30 ENCOUNTER — Encounter (HOSPITAL_COMMUNITY)
Admission: RE | Disposition: A | Discharge status: Home / Self Care | Payer: Self-pay | Source: Ambulatory Visit | Attending: Internal Medicine

## 2017-11-30 DIAGNOSIS — Z7951 Long term (current) use of inhaled steroids: Secondary | ICD-10-CM | POA: Insufficient documentation

## 2017-11-30 DIAGNOSIS — K210 Gastro-esophageal reflux disease with esophagitis: Secondary | ICD-10-CM | POA: Insufficient documentation

## 2017-11-30 DIAGNOSIS — K209 Esophagitis, unspecified: Secondary | ICD-10-CM

## 2017-11-30 DIAGNOSIS — K219 Gastro-esophageal reflux disease without esophagitis: Secondary | ICD-10-CM

## 2017-11-30 DIAGNOSIS — Z79899 Other long term (current) drug therapy: Secondary | ICD-10-CM | POA: Insufficient documentation

## 2017-11-30 DIAGNOSIS — R1310 Dysphagia, unspecified: Secondary | ICD-10-CM | POA: Diagnosis not present

## 2017-11-30 DIAGNOSIS — F909 Attention-deficit hyperactivity disorder, unspecified type: Secondary | ICD-10-CM | POA: Diagnosis not present

## 2017-11-30 DIAGNOSIS — K449 Diaphragmatic hernia without obstruction or gangrene: Secondary | ICD-10-CM | POA: Diagnosis not present

## 2017-11-30 DIAGNOSIS — F840 Autistic disorder: Secondary | ICD-10-CM | POA: Insufficient documentation

## 2017-11-30 DIAGNOSIS — K222 Esophageal obstruction: Secondary | ICD-10-CM | POA: Insufficient documentation

## 2017-11-30 HISTORY — PX: MALONEY DILATION: SHX5535

## 2017-11-30 HISTORY — PX: ESOPHAGOGASTRODUODENOSCOPY (EGD) WITH PROPOFOL: SHX5813

## 2017-11-30 SURGERY — ESOPHAGOGASTRODUODENOSCOPY (EGD) WITH PROPOFOL
Anesthesia: General

## 2017-11-30 MED ORDER — LACTATED RINGERS IV SOLN
INTRAVENOUS | Status: DC
  Administered 2017-11-30: 1000 mL via INTRAVENOUS

## 2017-11-30 MED ORDER — PROPOFOL 10 MG/ML IV BOLUS
INTRAVENOUS | Status: AC
  Filled 2017-11-30: qty 40

## 2017-11-30 MED ORDER — CHLORHEXIDINE GLUCONATE CLOTH 2 % EX PADS: 6.0000 | MEDICATED_PAD | Freq: Once | CUTANEOUS | Status: DC

## 2017-11-30 MED ORDER — MIDAZOLAM HCL 5 MG/5ML IJ SOLN
INTRAMUSCULAR | Status: DC | PRN
  Administered 2017-11-30: 2 mg via INTRAVENOUS

## 2017-11-30 MED ORDER — PROPOFOL 500 MG/50ML IV EMUL
INTRAVENOUS | Status: DC | PRN
  Administered 2017-11-30: 150 ug/kg/min via INTRAVENOUS

## 2017-11-30 MED ORDER — LIDOCAINE VISCOUS 2 % MT SOLN
15.0000 mL | Freq: Two times a day (BID) | OROMUCOSAL | Status: DC
  Administered 2017-11-30: 5 mL via OROMUCOSAL

## 2017-11-30 MED ORDER — LIDOCAINE VISCOUS 2 % MT SOLN
OROMUCOSAL | Status: AC
  Filled 2017-11-30: qty 15

## 2017-11-30 NOTE — Anesthesia Procedure Notes (Signed)
Procedure Name: General with mask airway Performed by: Pernell Dupre Orestes Geiman A, CRNA Pre-anesthesia Checklist: Patient identified, Suction available, Emergency Drugs available, Patient being monitored and Timeout performed Oxygen Delivery Method: Simple face mask

## 2017-11-30 NOTE — Op Note (Signed)
[facility] Patient Name: Elijah [name] Procedure Date: 11/30/2017 11:05 AM MRN: [id number] Date of Birth: [birth year] Attending MD: Gennette Pac , MD CSN: [id number] Age: 22 Admit Type: Outpatient Procedure:                Upper GI endoscopy Indications:              Dysphagia Providers:                Gennette Pac, MD, Jannett Celestine, RN, Edythe Clarity, Technician Referring MD:              Medicines:                Propofol per Anesthesia Complications:            No immediate complications. Estimated Blood Loss:     Estimated blood loss: none. Procedure:                Pre-Anesthesia Assessment:                           - Prior to the procedure, a History and Physical                            was performed, and patient medications and                            allergies were reviewed. The patient's tolerance of                            previous anesthesia was also reviewed. The risks                            and benefits of the procedure and the sedation                            options and risks were discussed with the patient.                            All questions were answered, and informed consent                            was obtained. Prior Anticoagulants: The patient has                            taken no previous anticoagulant or antiplatelet                            agents. ASA Grade Assessment: II - A patient with                            mild systemic disease. After reviewing the risks  and benefits, the patient was deemed in                            satisfactory condition to undergo the procedure.                           After obtaining informed consent, the endoscope was                            passed under direct vision. Throughout the                            procedure, the patient's blood pressure, pulse, and                            oxygen saturations were monitored  continuously. The                            [id number]) scope was introduced through the                            and advanced to the second part of duodenum. The                            upper GI endoscopy was accomplished without                            difficulty. The patient tolerated the procedure                            well. Scope In: 11:20:59 AM Scope Out: 11:26:16 AM Total Procedure Duration: 0 hours 5 minutes 17 seconds  Findings:      Esophagitis was found. multiple distal esophageal erosions present..       Schatzki's ring present.no Barrett's epithelium. No tumor seen. The       scope was withdrawn. Dilation was performed with a Maloney dilator with       mild resistance at 56 Fr. The dilation site was examined following       endoscope reinsertion and showed mild mucosal disruption. Estimated       blood loss: none.      A small hiatal hernia was present. The remainder of the gastric mucosa       appeared normal.      The cardia and gastric fundus were normal on retroflexion. Impression:               - erosive reflux esophagitis. Schatzki's ring                            status post dilation.                           - Small hiatal hernia.                           - No specimens collected. Moderate Sedation:      Moderate (  conscious) sedation was personally administered by an       anesthesia professional. The following parameters were monitored: oxygen       saturation, heart rate, blood pressure, respiratory rate, EKG, adequacy       of pulmonary ventilation, and response to care. Total physician       intraservice time was 13 minutes. Recommendation:           - Patient has a contact number available for                            emergencies. The signs and symptoms of potential                            delayed complications were discussed with the                            patient. Return to normal activities tomorrow.                             Written discharge instructions were provided to the                            patient.                           - Resume previous diet.                           - Continue present medications. increase Nexium to                            40 mg twice daily. Office visit wih [place] in 3 months                           - Procedure Code(s):        --- Professional ---                           [phone], Esophagogastroduodenoscopy, flexible,                            transoral; diagnostic, including collection of                            specimen(s) by brushing or washing, when performed                            (separate procedure)                           43450, Dilation of esophagus, by unguided sound or                            bougie, single or multiple passes Diagnosis Code(s):        --- Professional ---  K20.9, Esophagitis, unspecified                           K44.9, Diaphragmatic hernia without obstruction or                            gangrene                           R13.10, Dysphagia, unspecified CPT copyright 2017 American Medical Association. All rights reserved. The codes documented in this report are preliminary and upon coder review may  be revised to meet current compliance requirements. Gerrit Friends. Retaj Hilbun, MD Gennette Pac, MD 11/30/2017 11:34:03 AM This report has been signed electronically. Number of Addenda: 0

## 2017-11-30 NOTE — Interval H&P Note (Signed)
History and Physical Interval Note:  11/30/2017 11:06 AM  Elijah [name]  has presented today for surgery, with the diagnosis of dysphagia, GERD  The various methods of treatment have been discussed with the patient and family. After consideration of risks, benefits and other options for treatment, the patient has consented to  Procedure(s) with comments: ESOPHAGOGASTRODUODENOSCOPY (EGD) WITH PROPOFOL (N/A) - 1:00pm MALONEY DILATION (N/A) as a surgical intervention .  The patient's history has been reviewed, patient examined, no change in status, stable for surgery.  I have reviewed the patient's chart and labs.  Questions were answered to the patient's satisfaction.     Elijah [name]  No change. EGD with esophageal dilation as /appropriate per plan.  The risks, benefits, limitations, alternatives and imponderables have been reviewed with the patient. Potential for esophageal dilation, biopsy, etc. have also been reviewed.  Questions have been answered. All parties agreeable.

## 2017-11-30 NOTE — Discharge Instructions (Signed)
PATIENT INSTRUCTIONS POST-ANESTHESIA  IMMEDIATELY FOLLOWING SURGERY:  Do not drive or operate machinery for the first twenty four hours after surgery.  Do not make any important decisions for twenty four hours after surgery or while taking narcotic pain medications or sedatives.  If you develop intractable nausea and vomiting or a severe headache please notify your doctor immediately.  FOLLOW-UP:  Please make an appointment with your surgeon as instructed. You do not need to follow up with anesthesia unless specifically instructed to do so.  WOUND CARE INSTRUCTIONS (if applicable):  Keep a dry clean dressing on the anesthesia/puncture wound site if there is drainage.  Once the wound has quit draining you may leave it open to air.  Generally you should leave the bandage intact for twenty four hours unless there is drainage.  If the epidural site drains for more than 36-48 hours please call the anesthesia department.  QUESTIONS?:  Please feel free to call your physician or the hospital operator if you have any questions, and they will be happy to assist you.        EGD Discharge instructions Please read the instructions outlined below and refer to this sheet in the next few weeks. These discharge instructions provide you with general information on caring for yourself after you leave the hospital. Your doctor may also give you specific instructions. While your treatment has been planned according to the most current medical practices available, unavoidable complications occasionally occur. If you have any problems or questions after discharge, please call your doctor. ACTIVITY  You may resume your regular activity but move at a slower pace for the next 24 hours.   Take frequent rest periods for the next 24 hours.   Walking will help expel (get rid of) the air and reduce the bloated feeling in your abdomen.   No driving for 24 hours (because of the anesthesia (medicine) used during the test).     You may shower.   Do not sign any important legal documents or operate any machinery for 24 hours (because of the anesthesia used during the test).  NUTRITION  Drink plenty of fluids.   You may resume your normal diet.   Begin with a light meal and progress to your normal diet.   Avoid alcoholic beverages for 24 hours or as instructed by your caregiver.  MEDICATIONS  You may resume your normal medications unless your caregiver tells you otherwise.  WHAT YOU CAN EXPECT TODAY  You may experience abdominal discomfort such as a feeling of fullness or gas pains.  FOLLOW-UP  Your doctor will discuss the results of your test with you.  SEEK IMMEDIATE MEDICAL ATTENTION IF ANY OF THE FOLLOWING OCCUR:  Excessive nausea (feeling sick to your stomach) and/or vomiting.   Severe abdominal pain and distention (swelling).   Trouble swallowing.   Temperature over 101 F (37.8 C).   Rectal bleeding or vomiting of blood.    GERD information provided  Losing weight will help his GERD  Increase Nexium to 40 mg twice daily (30 minutes before breakfast and supper)  Office visit with [place] in 3 months   Gastroesophageal Reflux Disease, Adult Normally, food travels down the esophagus and stays in the stomach to be digested. If a person has gastroesophageal reflux disease (GERD), food and stomach acid move back up into the esophagus. When this happens, the esophagus becomes sore and swollen (inflamed). Over time, GERD can make small holes (ulcers) in the lining of the esophagus. Follow these instructions  at home: Diet  Follow a diet as told by your doctor. You may need to avoid foods and drinks such as: ? Coffee and tea (with or without caffeine). ? Drinks that contain alcohol. ? Energy drinks and sports drinks. ? Carbonated drinks or sodas. ? Chocolate and cocoa. ? Peppermint and mint flavorings. ? Garlic and onions. ? Horseradish. ? Spicy and acidic foods, such as peppers, chili  powder, curry powder, vinegar, hot sauces, and BBQ sauce. ? Citrus fruit juices and citrus fruits, such as oranges, lemons, and limes. ? Tomato-based foods, such as red sauce, chili, salsa, and pizza with red sauce. ? Fried and fatty foods, such as donuts, french fries, potato chips, and high-fat dressings. ? High-fat meats, such as hot dogs, rib eye steak, sausage, ham, and bacon. ? High-fat dairy items, such as whole milk, butter, and cream cheese.  Eat small meals often. Avoid eating large meals.  Avoid drinking large amounts of liquid with your meals.  Avoid eating meals during the 2-3 hours before bedtime.  Avoid lying down right after you eat.  Do not exercise right after you eat. General instructions  Pay attention to any changes in your symptoms.  Take over-the-counter and prescription medicines only as told by your doctor. Do not take aspirin, ibuprofen, or other NSAIDs unless your doctor says it is okay.  Do not use any tobacco products, including cigarettes, chewing tobacco, and e-cigarettes. If you need help quitting, ask your doctor.  Wear loose clothes. Do not wear anything tight around your waist.  Raise (elevate) the head of your bed about 6 inches (15 cm).  Try to lower your stress. If you need help doing this, ask your doctor.  If you are overweight, lose an amount of weight that is healthy for you. Ask your doctor about a safe weight loss goal.  Keep all follow-up visits as told by your doctor. This is important. Contact a doctor if:  You have new symptoms.  You lose weight and you do not know why it is happening.  You have trouble swallowing, or it hurts to swallow.  You have wheezing or a cough that keeps happening.  Your symptoms do not get better with treatment.  You have a hoarse voice. Get help right away if:  You have pain in your arms, neck, jaw, teeth, or back.  You feel sweaty, dizzy, or light-headed.  You have chest pain or shortness  of breath.  You throw up (vomit) and your throw up looks like blood or coffee grounds.  You pass out (faint).  Your poop (stool) is bloody or black.  You cannot swallow, drink, or eat. This information is not intended to replace advice given to you by your health care provider. Make sure you discuss any questions you have with your health care provider.

## 2017-11-30 NOTE — Transfer of Care (Signed)
Immediate Anesthesia Transfer of Care Note  Patient: Elijah [name]  Procedure(s) Performed: ESOPHAGOGASTRODUODENOSCOPY (EGD) WITH PROPOFOL (N/A ) MALONEY DILATION (N/A )  Patient Location: PACU  Anesthesia Type:General  Level of Consciousness: awake, alert , oriented and patient cooperative  Airway & Oxygen Therapy: Patient Spontanous Breathing  Post-op Assessment: Report given to RN and Post -op Vital signs reviewed and stable  Post vital signs: Reviewed and stable  Last Vitals:  Vitals Value Taken Time  BP    Temp    Pulse 107 11/30/2017 11:34 AM  Resp 20 11/30/2017 11:34 AM  SpO2 96 % 11/30/2017 11:34 AM  Vitals shown include unvalidated device data.  Last Pain:  Vitals:   11/30/17 0920  TempSrc: Oral  PainSc: 0-No pain      Patients Stated Pain Goal: 5 (11/30/17 0920)  Complications: No apparent anesthesia complications

## 2017-11-30 NOTE — Anesthesia Postprocedure Evaluation (Signed)
Anesthesia Post Note  Patient: TYRONNE BLANN  Procedure(s) Performed: ESOPHAGOGASTRODUODENOSCOPY (EGD) WITH PROPOFOL (N/A ) MALONEY DILATION (N/A )  Patient location during evaluation: PACU Anesthesia Type: General Level of consciousness: awake and alert and patient cooperative Pain management: pain level controlled Vital Signs Assessment: post-procedure vital signs reviewed and stable Respiratory status: spontaneous breathing and respiratory function stable Cardiovascular status: stable Postop Assessment: no apparent nausea or vomiting Anesthetic complications: no     Last Vitals:  Vitals:   11/30/17 0920  BP: 135/84  Pulse: 77  Resp: 18  Temp: 36.7 C  SpO2: 96%    Last Pain:  Vitals:   11/30/17 0920  TempSrc: Oral  PainSc: 0-No pain                 ADAMS, AMY A

## 2017-11-30 NOTE — Anesthesia Preprocedure Evaluation (Signed)
Anesthesia Evaluation  Patient identified by MRN, date of birth, ID band Patient awake    Reviewed: Allergy & Precautions, NPO status , Patient's Chart, lab work & pertinent test results  Airway Mallampati: II  TM Distance: >3 FB Neck ROM: Full    Dental no notable dental hx. (+) Teeth Intact   Pulmonary neg pulmonary ROS, former smoker,  Quit in January after Bronchitis issues Told not to start again   Pulmonary exam normal breath sounds clear to auscultation       Cardiovascular Exercise Tolerance: Good negative cardio ROS Normal cardiovascular examI Rhythm:Regular Rate:Normal     Neuro/Psych PSYCHIATRIC DISORDERS Anxiety negative neurological ROS  negative psych ROS   GI/Hepatic negative GI ROS, Neg liver ROS, GERD  Medicated and Controlled,Denies Sx today  Last med 2 nights ago   Endo/Other  negative endocrine ROS  Renal/GU negative Renal ROS  negative genitourinary   Musculoskeletal negative musculoskeletal ROS (+)   Abdominal   Peds negative pediatric ROS (+)  Hematology negative hematology ROS (+)   Anesthesia Other Findings   Reproductive/Obstetrics negative OB ROS                             Anesthesia Physical Anesthesia Plan  ASA: II  Anesthesia Plan: General   Post-op Pain Management:    Induction: Intravenous  PONV Risk Score and Plan:   Airway Management Planned: Simple Face Mask and Nasal Cannula  Additional Equipment:   Intra-op Plan:   Post-operative Plan: Extubation in OR  Informed Consent: I have reviewed the patients History and Physical, chart, labs and discussed the procedure including the risks, benefits and alternatives for the proposed anesthesia with the patient or authorized representative who has indicated his/her understanding and acceptance.   Dental advisory given  Plan Discussed with: CRNA  Anesthesia Plan Comments:          Anesthesia Quick Evaluation

## 2017-12-05 ENCOUNTER — Encounter (HOSPITAL_COMMUNITY): Payer: Self-pay | Admitting: Internal Medicine

## 2017-12-13 IMAGING — DX DG ANKLE COMPLETE 3+V*R*
3 series · 3 of 3 positions shown · non-contrast
Comparison: 07/29/2006

CLINICAL DATA: Fall in the fall wall walking today. Twisted ankle.
right ankle pain. Initial encounter.

EXAM:
RIGHT ANKLE - COMPLETE 3+ VIEW

[ankle ap]
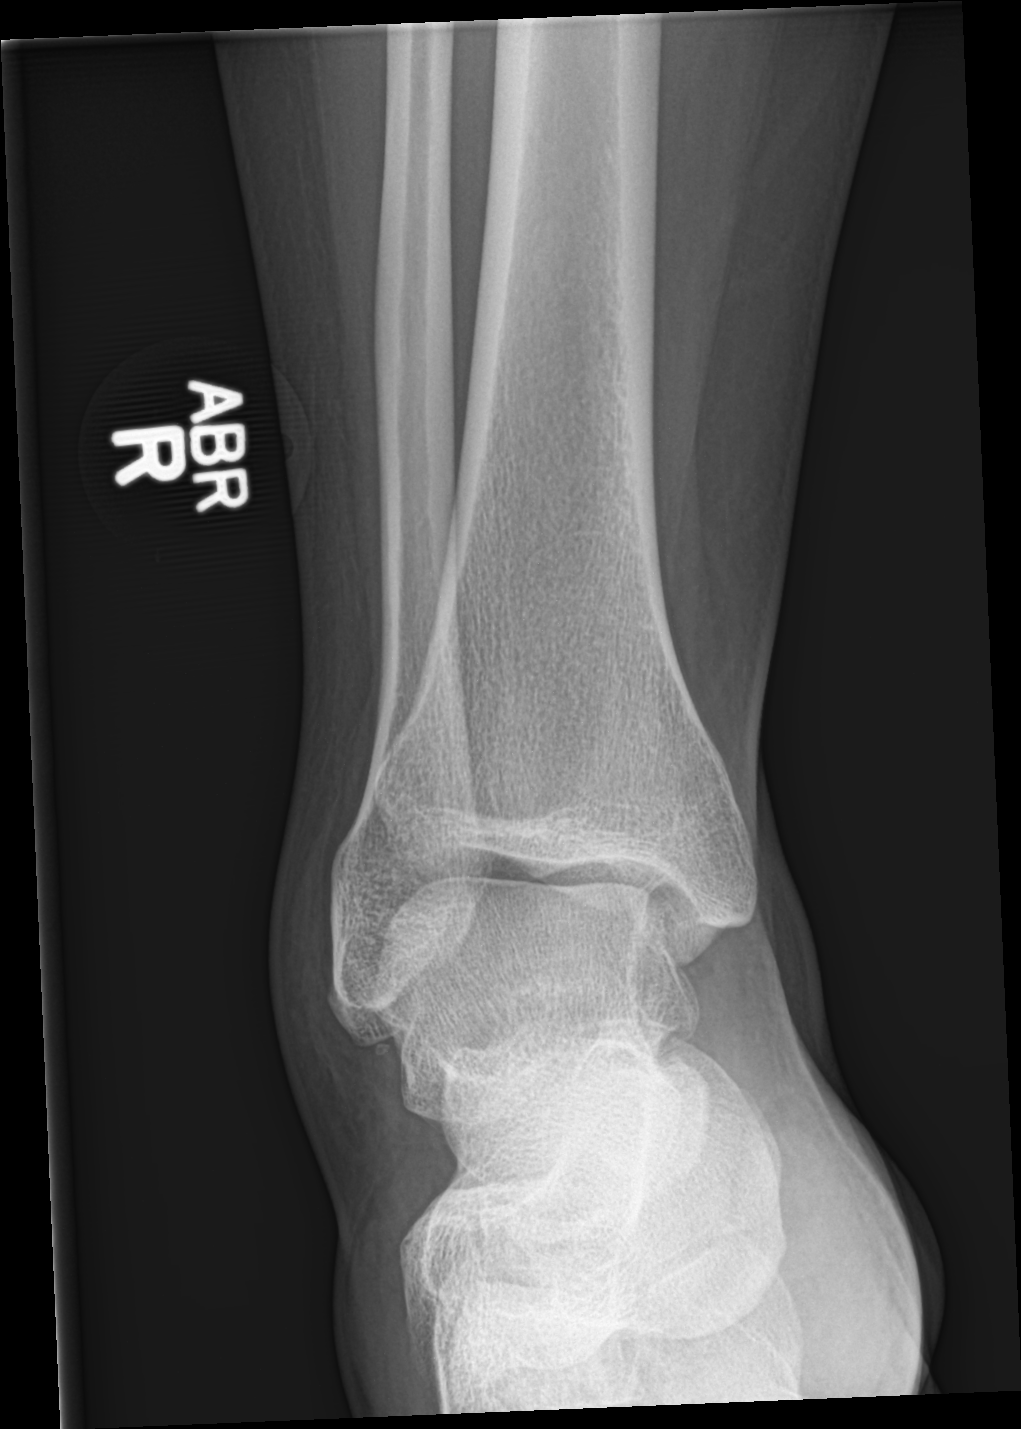

[ankle obl]
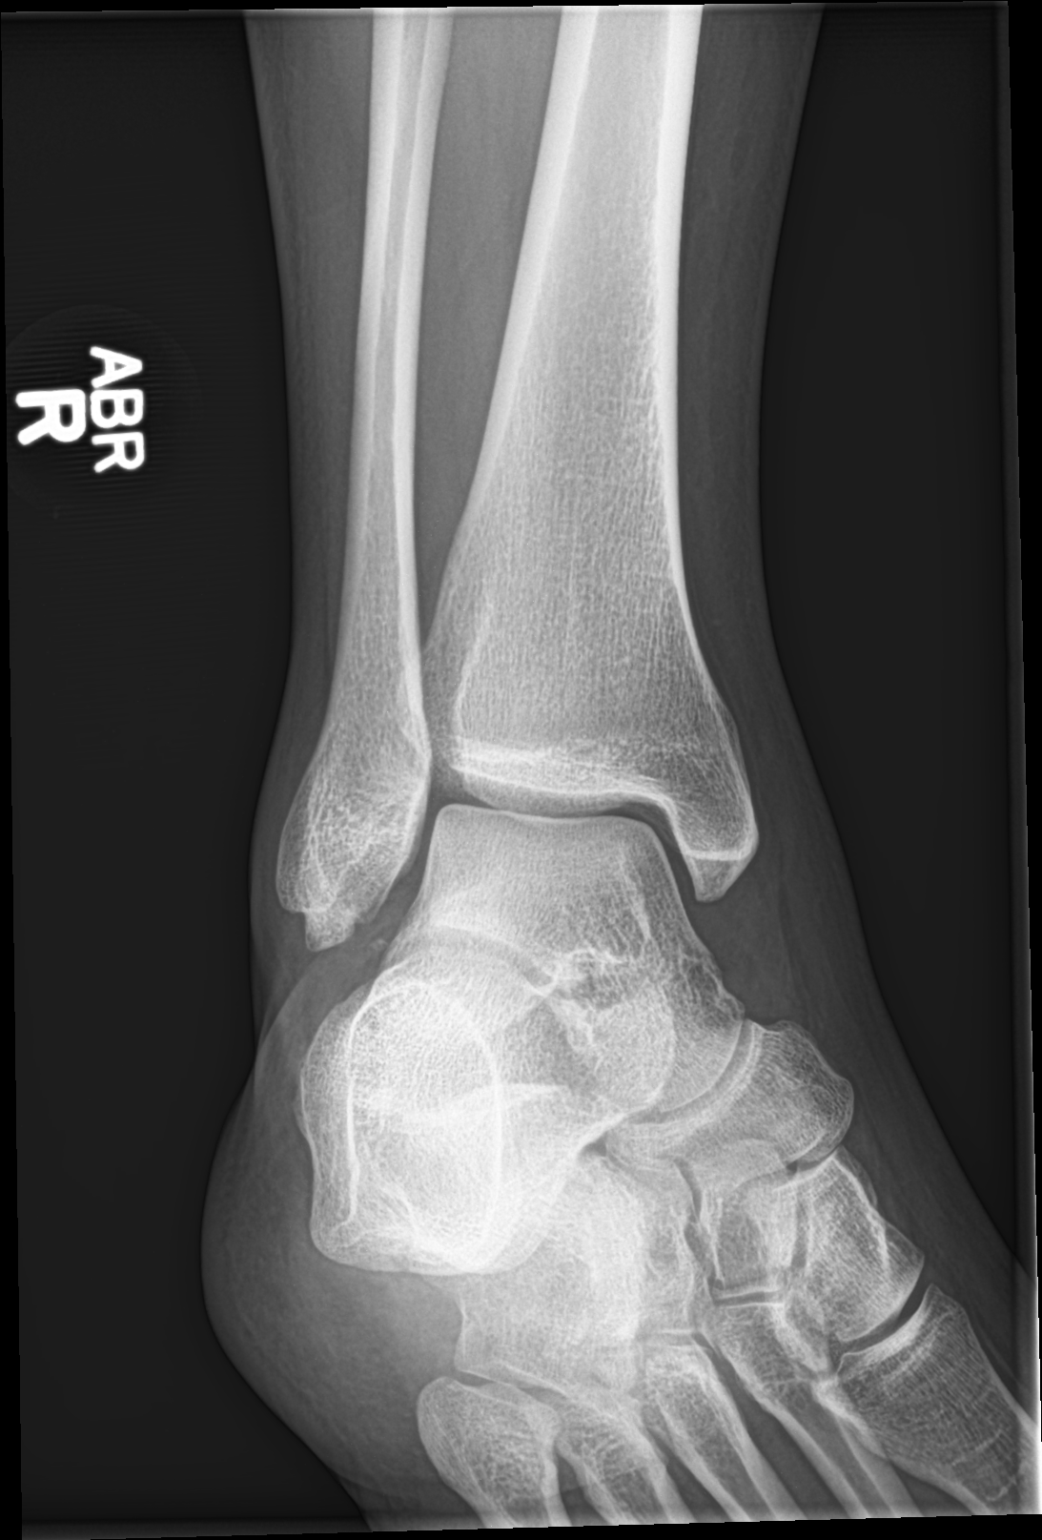

[ankle lat]
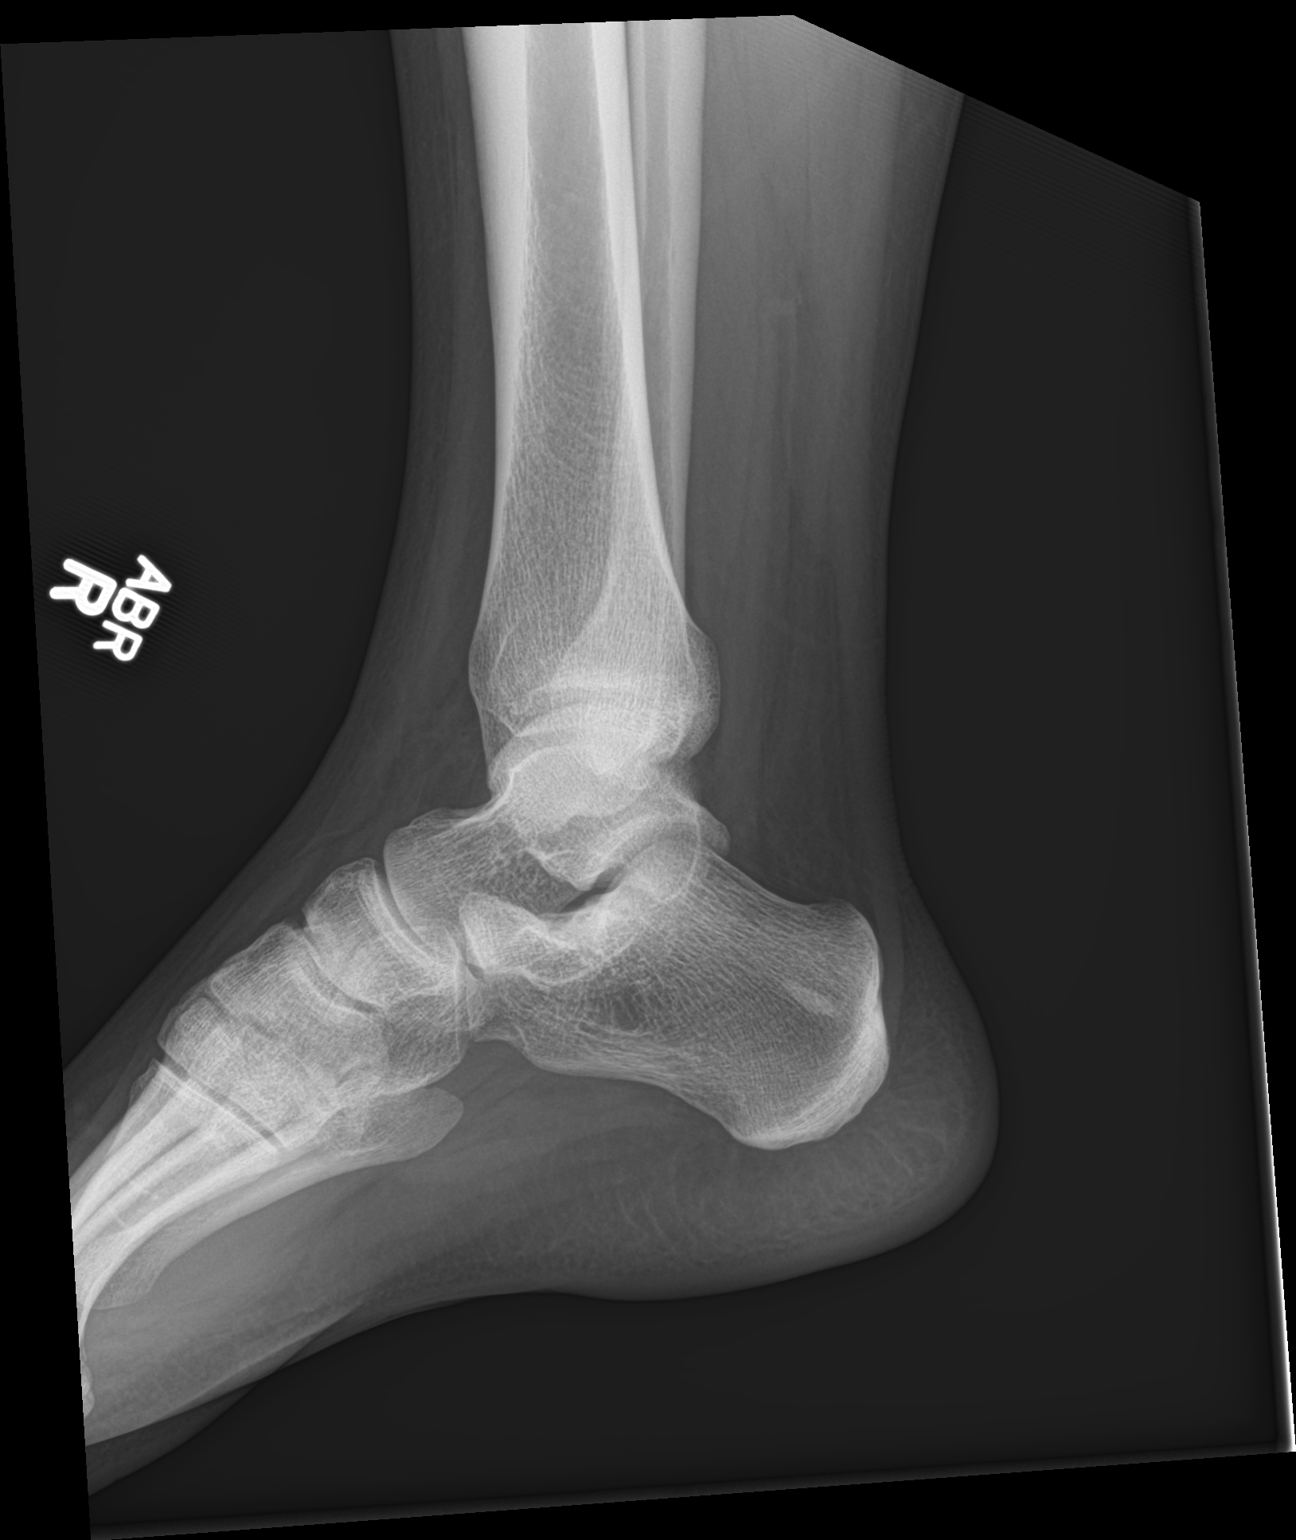

[3 of 3 positions shown; findings below may reference images not displayed]

FINDINGS: There is no evidence of acute fracture, dislocation, or joint
effusion. Old tiny avulsion fracture fragment seen along the
inferior aspect of the lateral malleolus. There is no evidence of
arthropathy or other focal bone abnormality. Soft tissues are
unremarkable.
IMPRESSION: No acute findings.

## 2018-02-15 ENCOUNTER — Ambulatory Visit: Payer: Medicaid Other | Admitting: Gastroenterology

## 2018-02-21 ENCOUNTER — Emergency Department (HOSPITAL_COMMUNITY): Payer: Medicaid Other

## 2018-02-21 ENCOUNTER — Emergency Department (HOSPITAL_COMMUNITY)
Admission: EM | Admit: 2018-02-21 | Discharge: 2018-02-21 | Disposition: A | Discharge status: Home / Self Care | Payer: Medicaid Other | Attending: Emergency Medicine | Admitting: Emergency Medicine

## 2018-02-21 ENCOUNTER — Other Ambulatory Visit: Payer: Self-pay

## 2018-02-21 ENCOUNTER — Encounter (HOSPITAL_COMMUNITY): Payer: Self-pay

## 2018-02-21 DIAGNOSIS — M79641 Pain in right hand: Secondary | ICD-10-CM | POA: Diagnosis not present

## 2018-02-21 DIAGNOSIS — Z79899 Other long term (current) drug therapy: Secondary | ICD-10-CM | POA: Diagnosis not present

## 2018-02-21 DIAGNOSIS — F909 Attention-deficit hyperactivity disorder, unspecified type: Secondary | ICD-10-CM | POA: Diagnosis not present

## 2018-02-21 DIAGNOSIS — F840 Autistic disorder: Secondary | ICD-10-CM | POA: Diagnosis not present

## 2018-02-21 DIAGNOSIS — L239 Allergic contact dermatitis, unspecified cause: Secondary | ICD-10-CM | POA: Insufficient documentation

## 2018-02-21 MED ORDER — DIPHENHYDRAMINE-ZINC ACETATE 2-0.1 % EX CREA
TOPICAL_CREAM | Freq: Every day | CUTANEOUS | Status: DC | PRN
  Filled 2018-02-21: qty 28

## 2018-02-21 NOTE — ED Notes (Signed)
Have updated pt on the wait for the medication. Verbalized understanding

## 2018-02-21 NOTE — ED Triage Notes (Signed)
Pt hit a pole last night with right hand. Slight swelling noted on top of hand. Pt is able to move all digits. Also would like to be treated for poison ivy. Has been using calamine lotion for 2 weeks with no relief.

## 2018-02-21 NOTE — Discharge Instructions (Addendum)
As discussed, your evaluation today has been largely reassuring.  But, it is important that you monitor your condition carefully, and do not hesitate to return to the ED if you develop new, or concerning changes in your condition. ? ?Otherwise, please follow-up with your physician for appropriate ongoing care. ? ?

## 2018-02-21 NOTE — ED Provider Notes (Signed)
[facility] EMERGENCY DEPARTMENT Provider Note   CSN: [id number] Arrival date & time: 02/21/18  [id number]     History   Chief Complaint Chief Complaint  Patient presents with  . Hand Pain    HPI Elijah [name] is a 22 y.o. male.  HPI Patient presents with concern of right hand pain, as well as right forearm discomfort. Form discomfort began about a week ago, after the patient was cutting brush, felt onset of itchiness consistent with poison ivy or allergic dermatitis. Since that time he has had itchiness in the forearm, not improved with topical OTC medication. No other dermatitis-like areas. Patient's primary concern however, his pain about the dorsum of the hand that began yesterday after he accidentally struck it against a solid object. Since that time he has had pain, swelling about the dorsum just medial to the first digit.  pain is worse with motion, though he can move all digits, and the wrist appropriately. No medication taken for additional relief, but given concern for increasing pain he presents for evaluation.  Past Medical History:  Diagnosis Date  . Acid reflux   . ADD (attention deficit disorder)   . ADHD (attention deficit hyperactivity disorder)   . Anxiety   . Autism   . Learning disability   . Mild mental slowing     Patient Active Problem List   Diagnosis Date Noted  . Constipation 11/17/2017  . Rectal bleeding 11/17/2017  . Esophageal dysphagia 11/17/2017  . GERD (gastroesophageal reflux disease) 03/09/2017  . Suicidal ideation 03/09/2017  . Adjustment disorder with mixed anxiety and depressed mood 03/09/2017  . Autism spectrum disorder 03/08/2017  . Intellectual disability 11/04/2016  . Aggressive behavior   . Auditory hallucinations   . Learning disability 12/21/2014  . ADD (attention deficit disorder) 11/24/2012  . Morbid obesity (HCC) 11/24/2012    Past Surgical History:  Procedure Laterality Date  . ESOPHAGOGASTRODUODENOSCOPY (EGD)  WITH PROPOFOL N/A 11/30/2017   Procedure: ESOPHAGOGASTRODUODENOSCOPY (EGD) WITH PROPOFOL;  Surgeon: Corbin Ade, MD;  Location: [facility];  Service: Endoscopy;  Laterality: N/A;  1:00pm  . MALONEY DILATION N/A 11/30/2017   Procedure: Elease Hashimoto DILATION;  Surgeon: Corbin Ade, MD;  Location: [facility] ENDO SUITE;  Service: Endoscopy;  Laterality: N/A;        Home Medications    Prior to Admission medications   Medication Sig Start Date End Date Taking? Authorizing Provider  albuterol (PROVENTIL HFA;VENTOLIN HFA) 108 (90 Base) MCG/ACT inhaler Inhale 2 puffs into the lungs every 6 (six) hours as needed for wheezing or shortness of breath. 07/27/17   Merlyn Albert, MD  albuterol (PROVENTIL HFA;VENTOLIN HFA) 108 (90 Base) MCG/ACT inhaler Inhale 2 puffs into the lungs every 4 (four) hours as needed. 07/29/17   Devoria Albe, MD  esomeprazole (NEXIUM) 40 MG capsule Take 40 mg by mouth at bedtime.    [provider]  fluticasone (FLONASE) 50 MCG/ACT nasal spray Place 2 sprays into both nostrils daily. Prn head congestion Patient taking differently: Place 2 sprays into both nostrils as needed. Prn head congestion 02/11/17   Loren Racer, MD  linaclotide [facility]) 72 MCG capsule Take 1 capsule (72 mcg total) by mouth daily before breakfast. 11/16/17   Tiffany Kocher, PA-C  loratadine (CLARITIN) 10 MG tablet Take 1 tablet (10 mg total) by mouth daily. 02/11/17   Loren Racer, MD  sertraline (ZOLOFT) 50 MG tablet Take 1 tablet (50 mg total) by mouth daily. 06/19/17   Babs Sciara,  MD    Family History Family History  Problem Relation Age of Onset  . Obesity Mother        gastric sleeve  . Colon cancer Neg Hx     Social History Social History   Tobacco Use  . Smoking status: Never Smoker  . Smokeless tobacco: Never Used  Substance Use Topics  . Alcohol use: Yes    Comment: occassional  . Drug use: No     Allergies   Patient has no known allergies.   Review of  Systems Review of Systems  Constitutional:       Per HPI, otherwise negative  HENT:       Per HPI, otherwise negative  Respiratory:       Per HPI, otherwise negative  Cardiovascular:       Per HPI, otherwise negative  Gastrointestinal: Negative for vomiting.  Endocrine:       Negative aside from HPI  Genitourinary:       Neg aside from HPI   Musculoskeletal:       Per HPI, otherwise negative  Skin: Positive for rash.  Neurological: Negative for syncope.       Patient has cognitive impairment     Physical Exam Updated Vital Signs BP (!) 147/92 (BP Location: Left Arm)   Pulse 91   Temp 97.6 F (36.4 C) (Oral)   Resp 12   Ht 5\' 5"  (1.651 m)   Wt 111.6 kg (246 lb)   SpO2 98%   BMI 40.94 kg/m   Physical Exam  Constitutional: He is oriented to person, place, and time. He appears well-developed. No distress.  HENT:  Head: Normocephalic and atraumatic.  Eyes: Conjunctivae and EOM are normal.  Cardiovascular: Normal rate and regular rhythm.  Pulmonary/Chest: Effort normal. No stridor. No respiratory distress.  Abdominal: He exhibits no distension.  Musculoskeletal: He exhibits no edema.       Right elbow: Normal.      Right wrist: Normal.  On the dorsum of the hand distal second, third metacarpal areas there is substantial swelling, tenderness to palpation, though the patient can extend those 2 digits nearly appropriately.  Neurological: He is alert and oriented to person, place, and time.  Skin: Skin is warm and dry.     Psychiatric: He is slowed. Cognition and memory are impaired.  Nursing note and vitals reviewed.    ED Treatments / Results   Radiology Dg Hand Complete Right  Result Date: 02/21/2018 CLINICAL DATA:  Right hand pain, swelling EXAM: RIGHT HAND - COMPLETE 3+ VIEW COMPARISON:  None. FINDINGS: There is no evidence of fracture or dislocation. There is no evidence of arthropathy or other focal bone abnormality. Soft tissues are unremarkable.  IMPRESSION: Negative. Electronically Signed   By: Charlett NoseKevin  Dover M.D.   On: 02/21/2018 07:57    Procedures Procedures (including critical care time)  Medications Ordered in ED Medications  diphenhydrAMINE-zinc acetate (BENADRYL) 2-0.1 % cream (has no administration in time range)     Initial Impression / Assessment and Plan / ED Course  I have reviewed the triage vital signs and the nursing notes.  Pertinent labs & imaging results that were available during my care of the patient were reviewed by me and considered in my medical decision making (see chart for details).     8:00 AM Patient in no distress on repeat exam, aware of all findings, I reviewed the x-ray, agree with the interpretation.  This young male with cognitive delay presents with  right hand swelling following incidental trauma. No evidence for fracture, suspicion for contusion, and he is distally neurovascularly intact. Patient also has evidence for allergic dermatitis. No evidence for systemic disease per Patient started on topical Benadryl, discharged in stable condition. Final Clinical Impressions(s) / ED Diagnoses  Hand pain, right, initial encounter Allergic dermatitis   Gerhard Munch, MD 02/21/18 [phone]

## 2018-02-21 NOTE — ED Notes (Signed)
Have paged pharmacy for cream. Stated they will bring it.

## 2018-02-28 ENCOUNTER — Encounter (HOSPITAL_COMMUNITY): Payer: Self-pay | Admitting: Internal Medicine

## 2018-05-24 ENCOUNTER — Telehealth: Payer: Self-pay | Admitting: Gastroenterology

## 2018-05-24 ENCOUNTER — Encounter: Payer: Self-pay | Admitting: Internal Medicine

## 2018-05-24 ENCOUNTER — Ambulatory Visit: Payer: Medicaid Other | Admitting: Gastroenterology

## 2018-05-24 NOTE — Telephone Encounter (Signed)
PATIENT WAS A NO SHOW AND LETTER SENT  °

## 2018-07-16 IMAGING — DX DG SHOULDER 2+V*L*
3 series · 3 of 3 positions shown · non-contrast
Comparison: Chest x-ray which included the right shoulder dated December 16, 2007

CLINICAL DATA: Left arm pain and tenderness for the past week since
lifting of cheeks on is construction job. History of morbid obesity.

EXAM:
LEFT SHOULDER - 2+ VIEW

[shoulder grashey]
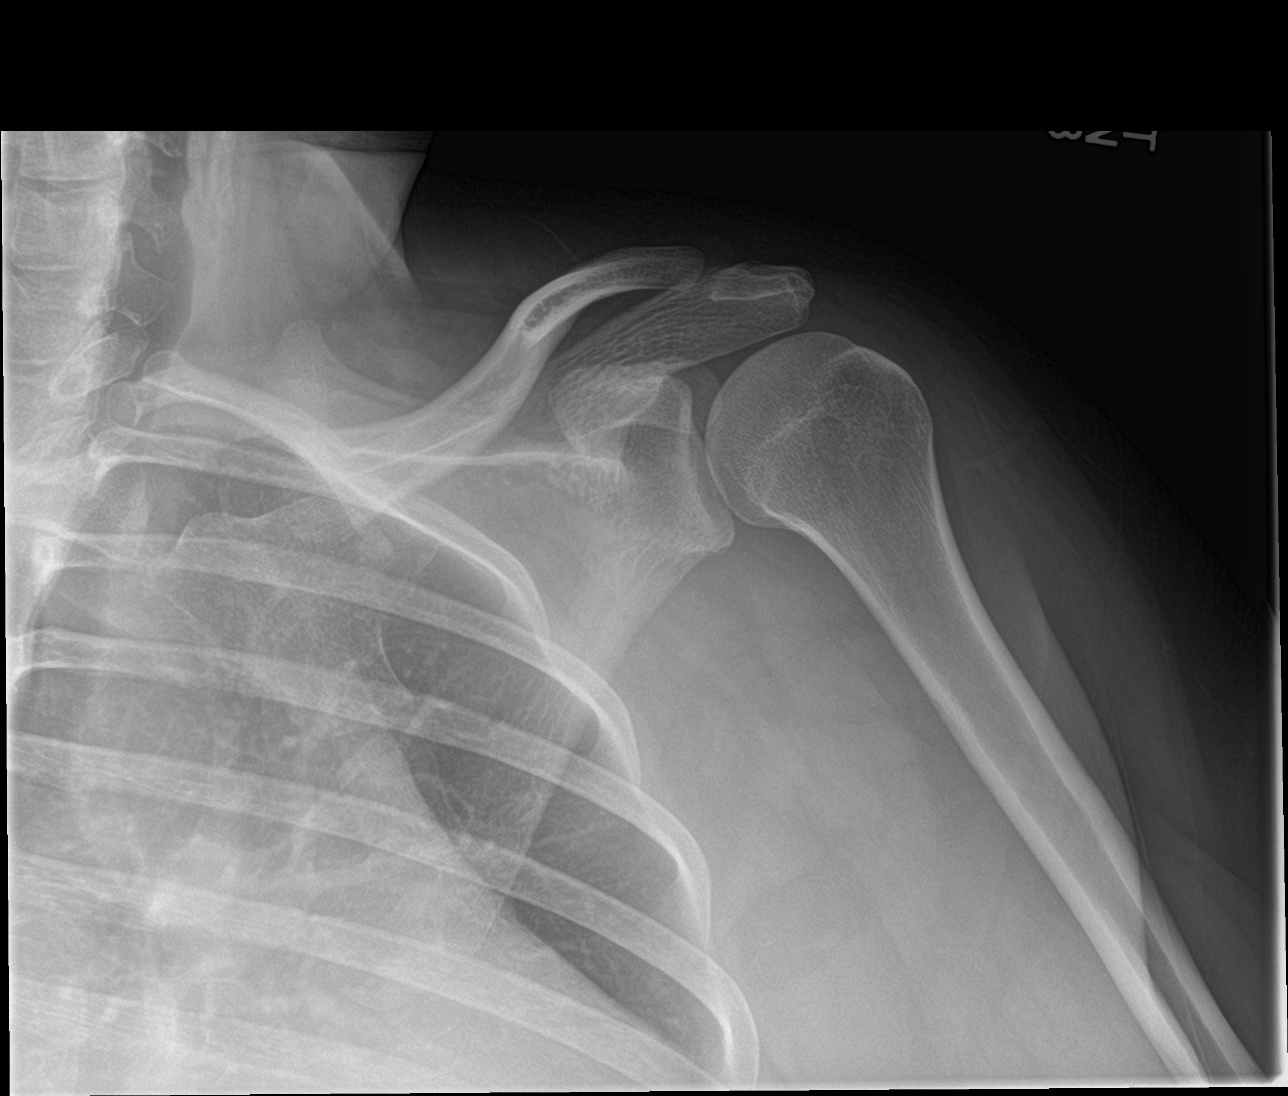

[shoulder y view]
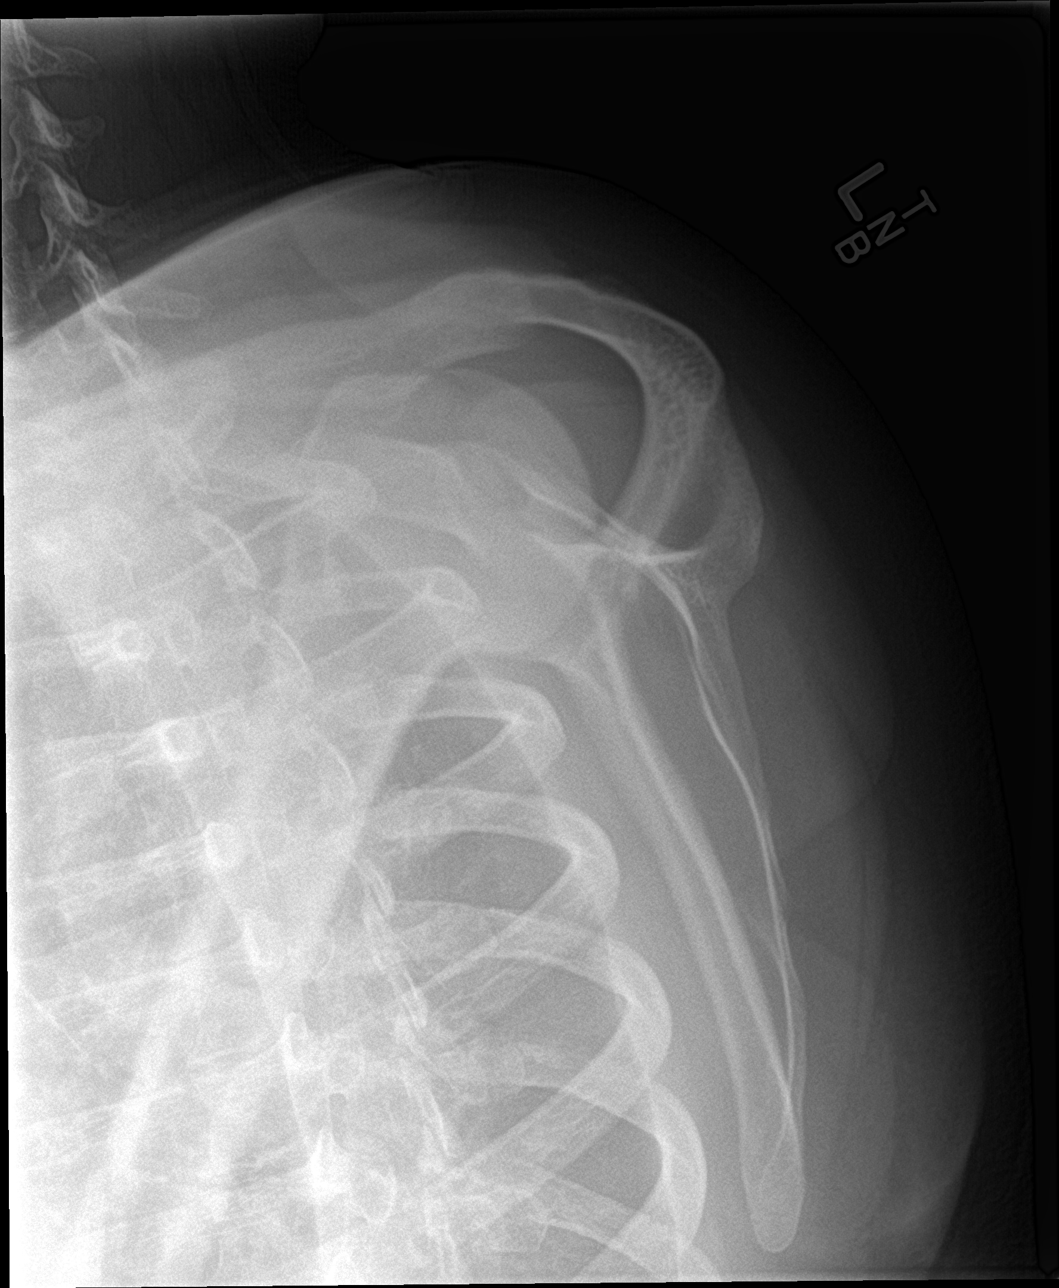

[shoulder axillary]
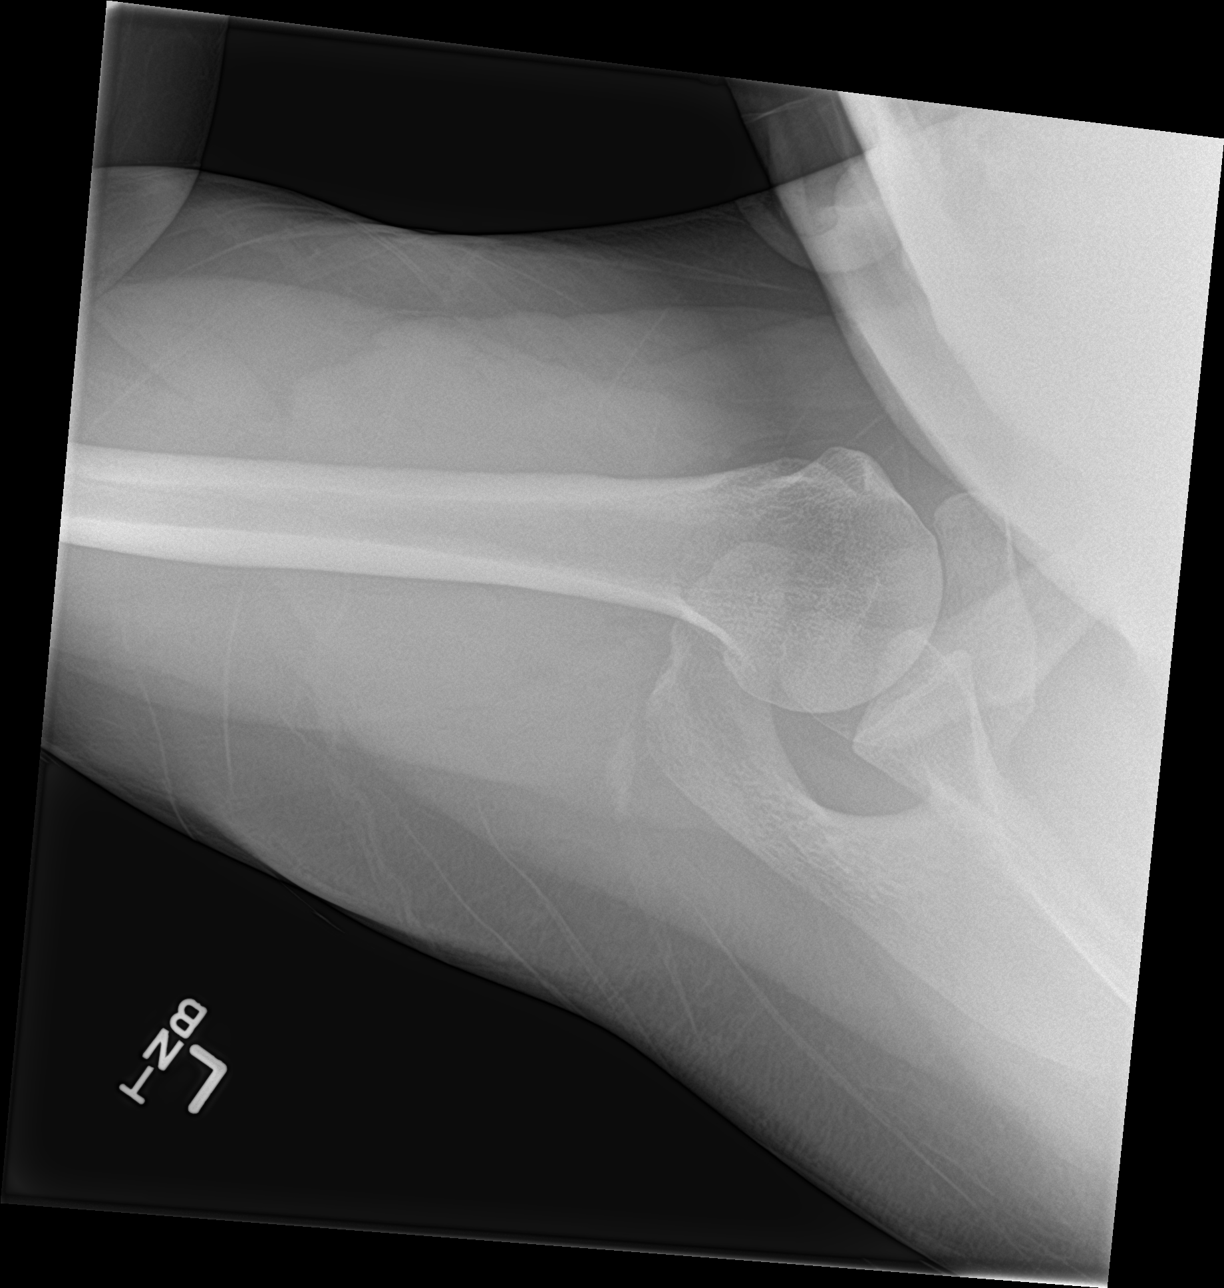

[3 of 3 positions shown; findings below may reference images not displayed]

FINDINGS: The bones are subjectively adequately mineralized. There is no acute
fracture nor dislocation. The joint spaces are well maintained.
There is no lytic or blastic bony lesion. The observed portions of
the left clavicle and upper left ribs are normal.
IMPRESSION: There is no acute or significant chronic bony abnormality of the
left shoulder.

## 2018-08-17 ENCOUNTER — Telehealth: Payer: Self-pay | Admitting: *Deleted

## 2018-08-17 ENCOUNTER — Telehealth: Payer: Self-pay | Admitting: Family Medicine

## 2018-08-17 ENCOUNTER — Ambulatory Visit (INDEPENDENT_AMBULATORY_CARE_PROVIDER_SITE_OTHER): Payer: Medicaid Other | Admitting: Family Medicine

## 2018-08-17 ENCOUNTER — Encounter: Payer: Self-pay | Admitting: Family Medicine

## 2018-08-17 VITALS — BP 124/78 | Wt 240.4 lb

## 2018-08-17 DIAGNOSIS — F339 Major depressive disorder, recurrent, unspecified: Secondary | ICD-10-CM

## 2018-08-17 DIAGNOSIS — J029 Acute pharyngitis, unspecified: Secondary | ICD-10-CM | POA: Diagnosis not present

## 2018-08-17 DIAGNOSIS — R454 Irritability and anger: Secondary | ICD-10-CM | POA: Diagnosis not present

## 2018-08-17 MED ORDER — SERTRALINE HCL 50 MG PO TABS
50.0000 mg | ORAL_TABLET | Freq: Every day | ORAL | 0 refills | Status: DC
Start: 2018-08-17 — End: 2018-08-30

## 2018-08-17 MED ORDER — ESOMEPRAZOLE MAGNESIUM 40 MG PO CPDR: 40.0000 mg | DELAYED_RELEASE_CAPSULE | Freq: Every day | ORAL | 5 refills | Status: DC

## 2018-08-17 NOTE — Progress Notes (Signed)
Referral put in.

## 2018-08-17 NOTE — Telephone Encounter (Signed)
Discussed with pt's mother. Mother verbalized understanding.  

## 2018-08-17 NOTE — Telephone Encounter (Signed)
I did read over her note-and appreciated the input  I do feel that the patient would benefit from vocational rehab to help him get a job  I do feel that the patient is suffering with multiple issues Anger management issues  Mild depression Plus also poor insight in difficult impulse control I did not feel he was suicidal I recommended that he follow-up within 2 weeks We will be referring him for counseling I will also be connecting with social services to see what options/avenues there are regarding group homes Elijah [name] certainly wants to establish his own place but also to some degree agreed that getting his own place would require money and a steady job neither of which he has currently He does state some curiosity regarding a group home but the concept was difficult for him to wrap his brain around  We will work with social services to see what steps and avenues are available and let the mom know

## 2018-08-17 NOTE — Telephone Encounter (Signed)
Mother wants to know what Dr Roby Lofts response to her letter was and how she goes about placement somewhere for [facility].

## 2018-08-17 NOTE — Telephone Encounter (Signed)
Left message to return call 

## 2018-08-17 NOTE — Telephone Encounter (Signed)
Mother would like Dr.Scott's nurse to give her a call in regards of patients visit today, mom is on [place]. Mother aware notes have not been placed yet.

## 2018-08-17 NOTE — Progress Notes (Signed)
   Subjective:    Patient ID: Elijah [name], male    DOB: 10-Jan-1996, 23 y.o.   MRN: [id number]  Depression         This is a recurrent problem.  Pt is here to try to get back on medication. Pt has not seen any type of counseling at this time. Pt states nothing has became worse since last visit.  This patient relates that he finds himself feeling down at times other times angry other times he wished that he was able to have a job money to be able to have his own place he states he does not want to hurt anyone he does not want to hurt himself Pt would like refill on reflux medicaiton.  Review of Systems  Psychiatric/Behavioral: Positive for depression.  He does state he has had little bit of a sore throat denies body aches fever chills sweats     Objective:   Physical Exam  Lungs clear respiratory rate normal heart regular no murmurs eardrums normal throat is normal      Assessment & Plan:  Viral syndrome Pharyngitis No need for antibiotics  Mild depression Referral for counseling His mother is interested if there might be group home opportunities We will look into this Start Zoloft 1 daily follow-up within 2 weeks patient was warned that if his depression gets worse or if he starts feeling like he is got hurt himself or suicidal to immediately follow-up here or ER

## 2018-08-17 NOTE — Telephone Encounter (Signed)
Mother states that patient has been given Zoloft in the past and he would not take it because he didn't like the way it made him feel  So she doesn't know what to do- retry med or try something else- Please advise

## 2018-08-19 ENCOUNTER — Telehealth: Payer: Self-pay | Admitting: Family Medicine

## 2018-08-19 NOTE — Telephone Encounter (Signed)
Nurses This patient has disabilities, family and patient interested in the possibility of a group home  Patient lives in [place] with family Please call social services in [place] talk with them regarding what procedure would they have in place for a young adult who may need placing in a group home In other words-do they have a phone number or available person within the social services that would assist family with trying to help give referral regarding group homes  This young man unfortunately cannot live out on his own.  He would not be responsible with his money.  He is getting at the point where he does not want to live at home anymore.  Family is willing to help him but at the same time if they believe he would be happier in a group home they would help with this.  Please find out what resources social services has-hopefully they have a person in a phone number that family can call to talk with to get more information.  Please let us get this information for the family-phone number and social service worker   Once you collect this information please forward back to me

## 2018-08-19 NOTE — Telephone Encounter (Signed)
So at the time I saw him I had discussions with him regarding medication. I told him that in the past we use this medicine and he did not object But he does not always do a good job of representing himself.  I am willing to try a different medicine to try to help him I would recommend as a second choice fluoxetine which is the generic of Prozac If mom is in agreements we can start this at a low dose and follow him up in a few weeks Please talk with the mother [name]

## 2018-08-20 NOTE — Telephone Encounter (Signed)
So noted We will discuss further at his follow-up office visit in early February thank you

## 2018-08-20 NOTE — Telephone Encounter (Signed)
So at this point Please inform the mother of the patient the previous information If needing to call [employer] please go ahead and do so on behalf of family  This information was essentially information mother would be interested in

## 2018-08-20 NOTE — Telephone Encounter (Signed)
Spoke with Aftan there at [employer] she says we need to find out If the pt has coverage under [employer], if not regular insurance would not pay for him to stay with them. They number to [employer] [phone]. Per Aftan call them and see if the patient has services if so call them back and they will see where they can help.

## 2018-08-20 NOTE — Telephone Encounter (Signed)
I called and left a message to r/c to Child psychotherapistocial worker at [employer] [place] [facility].

## 2018-08-20 NOTE — Telephone Encounter (Signed)
I called and left a message for Elijah [name] ( she has a message also).

## 2018-08-20 NOTE — Telephone Encounter (Signed)
Mother states that she has reached out to [employer] and is awaiting there response on his coverage. Mother states things are more complicated for her since she has not got guardianship of Elijah [name] and is trying not to do that because they will take his license if she does. Mother states she opes she has more info for you at Elijah [name]'s follow up 08/20/18

## 2018-08-20 NOTE — Telephone Encounter (Signed)
[place] [phone].

## 2018-08-20 NOTE — Telephone Encounter (Signed)
I spoke with Sherlyn Lick Sw with [facility] she gave me the number [employer] [phone] ext [phone].

## 2018-08-20 NOTE — Telephone Encounter (Signed)
So noted thank you 

## 2018-08-20 NOTE — Telephone Encounter (Signed)
Mother stated he is doing really good taking the medication every night at bedtime and will stick with this for now

## 2018-08-23 ENCOUNTER — Encounter: Payer: Self-pay | Admitting: Family Medicine

## 2018-08-30 ENCOUNTER — Encounter: Payer: Self-pay | Admitting: Family Medicine

## 2018-08-30 ENCOUNTER — Ambulatory Visit (INDEPENDENT_AMBULATORY_CARE_PROVIDER_SITE_OTHER): Payer: Medicaid Other | Admitting: Family Medicine

## 2018-08-30 VITALS — BP 110/68 | Temp 97.6°F | Ht 65.0 in | Wt 238.8 lb

## 2018-08-30 DIAGNOSIS — B349 Viral infection, unspecified: Secondary | ICD-10-CM

## 2018-08-30 DIAGNOSIS — F339 Major depressive disorder, recurrent, unspecified: Secondary | ICD-10-CM | POA: Diagnosis not present

## 2018-08-30 MED ORDER — BUPROPION HCL ER (SR) 150 MG PO TB12: 150.0000 mg | ORAL_TABLET | Freq: Every day | ORAL | 2 refills | Status: DC

## 2018-08-30 MED ORDER — DICYCLOMINE HCL 10 MG PO CAPS: 10.0000 mg | ORAL_CAPSULE | Freq: Three times a day (TID) | ORAL | 0 refills | Status: DC

## 2018-08-30 NOTE — Progress Notes (Signed)
   Subjective:    Patient ID: ORA CRARY, male    DOB: 1996-06-13, 23 y.o.   MRN: [id number]  HPIFollow up on depression. Started on zoloft 2 weeks ago but pt only took one pill and stopped because it made him feel weird and gave him a headache.   Have been having fevers, cough, nausea, and diarrhea.     Review of Systems  Constitutional: Negative for activity change, chills and fever.  HENT: Positive for congestion and rhinorrhea. Negative for ear pain.   Eyes: Negative for discharge.  Respiratory: Positive for cough. Negative for wheezing.   Cardiovascular: Negative for chest pain.  Gastrointestinal: Negative for nausea and vomiting.  Musculoskeletal: Negative for arthralgias.       Objective:   Physical Exam Vitals signs and nursing note reviewed.  Constitutional:      Appearance: He is well-developed.  HENT:     Head: Normocephalic.     Mouth/Throat:     Pharynx: No oropharyngeal exudate.  Neck:     Musculoskeletal: Normal range of motion.  Cardiovascular:     Rate and Rhythm: Normal rate and regular rhythm.     Heart sounds: Normal heart sounds. No murmur.  Pulmonary:     Effort: Pulmonary effort is normal.     Breath sounds: Normal breath sounds. No wheezing.  Lymphadenopathy:     Cervical: No cervical adenopathy.  Skin:    General: Skin is warm and dry.  Neurological:     Motor: No abnormal muscle tone.           Assessment & Plan:  Depression-did not tolerate sertraline cause nausea therefore we will use Wellbutrin SR 150 mg 1 a day and will recheck him in 4 to 6 weeks  Viral syndrome May use dicyclomine as necessary to help with abdominal cramps no antibiotics necessary warning signs discussed  Vocational rehab-patient would benefit from vocational rehab hopefully they can help him get trained appropriately for work.  Patient tends to have low tolerance for hassles so hopefully they can work with him on that as well so that he will be able to  persevere with his work.  He does have some mild learning disabilities which does impact abilities to learn complicated jobs.  It would be for the best for this patient to learn a job and be able to have it to give him more independence

## 2018-09-28 ENCOUNTER — Ambulatory Visit: Payer: Medicaid Other | Admitting: Family Medicine

## 2018-10-10 ENCOUNTER — Ambulatory Visit (HOSPITAL_COMMUNITY): Payer: Self-pay | Admitting: Psychiatry

## 2018-10-12 DIAGNOSIS — Z029 Encounter for administrative examinations, unspecified: Secondary | ICD-10-CM

## 2018-12-03 ENCOUNTER — Telehealth: Payer: Self-pay | Admitting: Family Medicine

## 2018-12-03 MED ORDER — MOMETASONE FUROATE 0.1 % EX CREA: TOPICAL_CREAM | CUTANEOUS | 0 refills | Status: DC

## 2018-12-03 NOTE — Telephone Encounter (Signed)
Mother (DPR) notified by Dr Lorin Picket

## 2018-12-03 NOTE — Telephone Encounter (Signed)
Rash on arm at the elbow flex Present for a few weeks Will send in a steroid cream Follow up if ongoing

## 2018-12-05 ENCOUNTER — Telehealth: Payer: Self-pay | Admitting: *Deleted

## 2018-12-05 MED ORDER — TRIAMCINOLONE ACETONIDE 0.1 % EX CREA: TOPICAL_CREAM | CUTANEOUS | 1 refills | Status: DC

## 2018-12-05 NOTE — Telephone Encounter (Signed)
Try triamcinolone cream applied twice daily as needed 30 g to 1 refill

## 2018-12-05 NOTE — Telephone Encounter (Signed)
Andy from [employer] calling because mometasone cream is not covered by insurance and mother wanted to know if dr scott could change to something else.

## 2018-12-05 NOTE — Telephone Encounter (Signed)
Triamcinolone cream sent to pharmacy and mom is aware.

## 2019-01-09 ENCOUNTER — Other Ambulatory Visit: Payer: Self-pay

## 2019-01-09 ENCOUNTER — Encounter (HOSPITAL_COMMUNITY): Payer: Self-pay | Admitting: Emergency Medicine

## 2019-01-09 ENCOUNTER — Emergency Department (HOSPITAL_COMMUNITY)
Admission: EM | Admit: 2019-01-09 | Discharge: 2019-01-09 | Disposition: A | Discharge status: Home / Self Care | Payer: Medicaid Other | Attending: Emergency Medicine | Admitting: Emergency Medicine

## 2019-01-09 ENCOUNTER — Emergency Department (HOSPITAL_COMMUNITY): Payer: Medicaid Other

## 2019-01-09 DIAGNOSIS — F909 Attention-deficit hyperactivity disorder, unspecified type: Secondary | ICD-10-CM | POA: Diagnosis not present

## 2019-01-09 DIAGNOSIS — Z79899 Other long term (current) drug therapy: Secondary | ICD-10-CM | POA: Diagnosis not present

## 2019-01-09 DIAGNOSIS — R0789 Other chest pain: Secondary | ICD-10-CM

## 2019-01-09 DIAGNOSIS — F840 Autistic disorder: Secondary | ICD-10-CM | POA: Insufficient documentation

## 2019-01-09 DIAGNOSIS — R079 Chest pain, unspecified: Secondary | ICD-10-CM | POA: Diagnosis present

## 2019-01-09 LAB — CBC
HCT: 44.6 % (ref 39.0–52.0)
Hemoglobin: 14.1 g/dL (ref 13.0–17.0)
MCH: 26.6 pg (ref 26.0–34.0)
MCHC: 31.6 g/dL (ref 30.0–36.0)
MCV: 84.2 fL (ref 80.0–100.0)
Platelets: 322 10*3/uL (ref 150–400)
RBC: 5.3 MIL/uL (ref 4.22–5.81)
RDW: 13.2 % (ref 11.5–15.5)
WBC: 11.6 10*3/uL — ABNORMAL HIGH (ref 4.0–10.5)
nRBC: 0 % (ref 0.0–0.2)

## 2019-01-09 LAB — BASIC METABOLIC PANEL
Anion gap: 8 (ref 5–15)
BUN: 9 mg/dL (ref 6–20)
CO2: 28 mmol/L (ref 22–32)
Calcium: 9.5 mg/dL (ref 8.9–10.3)
Chloride: 104 mmol/L (ref 98–111)
Creatinine, Ser: 0.69 mg/dL (ref 0.61–1.24)
GFR calc Af Amer: >60 mL/min (ref >60)
GFR calc non Af Amer: >60 mL/min (ref >60)
Glucose, Bld: 86 mg/dL (ref 70–99)
Potassium: 3.8 mmol/L (ref 3.5–5.1)
Sodium: 140 mmol/L (ref 135–145)

## 2019-01-09 LAB — TROPONIN I: Troponin I: <0.03 ng/mL (ref <0.03)

## 2019-01-09 LAB — D-DIMER, QUANTITATIVE (NOT AT ARMC): D-Dimer, Quant: <0.27 ug/mL-FEU (ref 0.00–0.50)

## 2019-01-09 MED ORDER — SODIUM CHLORIDE 0.9% FLUSH: 3.0000 mL | Freq: Once | INTRAVENOUS | Status: DC

## 2019-01-09 MED ORDER — IBUPROFEN 800 MG PO TABS: 800.0000 mg | ORAL_TABLET | Freq: Three times a day (TID) | ORAL | 0 refills | Status: DC | PRN

## 2019-01-09 NOTE — Discharge Instructions (Addendum)
Follow-up with your family doctor next week for recheck. 

## 2019-01-09 NOTE — ED Provider Notes (Signed)
[facility] Provider Note   CSN: [id number] Arrival date & time: 01/09/19  1723     History   Chief Complaint Chief Complaint  Patient presents with  . Chest Pain    HPI Elijah [name] is a 23 y.o. male.     Patient complains of left-sided chest pain worse with movement  The history is provided by the patient. No language interpreter was used.  Chest Pain Pain location:  L chest Pain quality: aching   Pain radiates to:  Does not radiate Pain severity:  Moderate Onset quality:  Sudden Timing:  Constant Progression:  Worsening Associated symptoms: no abdominal pain, no back pain, no cough, no fatigue and no headache     Past Medical History:  Diagnosis Date  . Acid reflux   . ADD (attention deficit disorder)   . ADHD (attention deficit hyperactivity disorder)   . Anxiety   . Autism   . Learning disability   . Mild mental slowing     Patient Active Problem List   Diagnosis Date Noted  . Constipation 11/17/2017  . Rectal bleeding 11/17/2017  . Esophageal dysphagia 11/17/2017  . GERD (gastroesophageal reflux disease) 03/09/2017  . Suicidal ideation 03/09/2017  . Adjustment disorder with mixed anxiety and depressed mood 03/09/2017  . Autism spectrum disorder 03/08/2017  . Intellectual disability 11/04/2016  . Auditory hallucinations   . Learning disability 12/21/2014  . ADD (attention deficit disorder) 11/24/2012  . Morbid obesity (HCC) 11/24/2012    Past Surgical History:  Procedure Laterality Date  . ESOPHAGOGASTRODUODENOSCOPY (EGD) WITH PROPOFOL N/A 11/30/2017   Procedure: ESOPHAGOGASTRODUODENOSCOPY (EGD) WITH PROPOFOL;  Surgeon: Corbin Adeourk, Robert M, MD;  Location: [facility];  Service: Endoscopy;  Laterality: N/A;  1:00pm  . MALONEY DILATION N/A 11/30/2017   Procedure: Elease HashimotoMALONEY DILATION;  Surgeon: Corbin Adeourk, Robert M, MD;  Location: [facility] ENDO SUITE;  Service: Endoscopy;  Laterality: N/A;        Home Medications    Prior to Admission  medications   Medication Sig Start Date End Date Taking? Authorizing Provider  albuterol (PROVENTIL HFA;VENTOLIN HFA) 108 (90 Base) MCG/ACT inhaler Inhale 2 puffs into the lungs every 6 (six) hours as needed for wheezing or shortness of breath. 07/27/17   Merlyn AlbertLuking, William S, MD  buPROPion (WELLBUTRIN SR) 150 MG 12 hr tablet Take 1 tablet (150 mg total) by mouth daily. 08/30/18   Babs SciaraLuking, Scott A, MD  dicyclomine (BENTYL) 10 MG capsule Take 1 capsule (10 mg total) by mouth 3 (three) times daily before meals. 08/30/18   Babs SciaraLuking, Scott A, MD  esomeprazole (NEXIUM) 40 MG capsule Take 1 capsule (40 mg total) by mouth at bedtime. 08/17/18   Babs SciaraLuking, Scott A, MD  fluticasone (FLONASE) 50 MCG/ACT nasal spray Place 2 sprays into both nostrils daily. Prn head congestion Patient taking differently: Place 2 sprays into both nostrils as needed. Prn head congestion 02/11/17   Loren RacerYelverton, David, MD  ibuprofen (ADVIL) 800 MG tablet Take 1 tablet (800 mg total) by mouth every 8 (eight) hours as needed. 01/09/19   Bethann BerkshireZammit, Croy Drumwright, MD  linaclotide [facility](LINZESS) 72 MCG capsule Take 1 capsule (72 mcg total) by mouth daily before breakfast. 11/16/17   Tiffany KocherLewis, Leslie S, PA-C  loratadine (CLARITIN) 10 MG tablet Take 1 tablet (10 mg total) by mouth daily. 02/11/17   Loren RacerYelverton, David, MD  mometasone (ELOCON) 0.1 % cream Apply to affected area bid  daily 12/03/18 12/03/19  Babs SciaraLuking, Scott A, MD  triamcinolone cream (KENALOG) 0.1 % Apply  twice daily prn 12/05/18   Babs SciaraLuking, Scott A, MD    Family History Family History  Problem Relation Age of Onset  . Obesity Mother        gastric sleeve  . Colon cancer Neg Hx     Social History Social History   Tobacco Use  . Smoking status: Never Smoker  . Smokeless tobacco: Never Used  Substance Use Topics  . Alcohol use: Yes    Comment: occassional  . Drug use: No     Allergies   Patient has no known allergies.   Review of Systems Review of Systems  Constitutional: Negative for appetite change  and fatigue.  HENT: Negative for congestion, ear discharge and sinus pressure.   Eyes: Negative for discharge.  Respiratory: Negative for cough.   Cardiovascular: Positive for chest pain.  Gastrointestinal: Negative for abdominal pain and diarrhea.  Genitourinary: Negative for frequency and hematuria.  Musculoskeletal: Negative for back pain.  Skin: Negative for rash.  Neurological: Negative for seizures and headaches.  Psychiatric/Behavioral: Negative for hallucinations.     Physical Exam Updated Vital Signs BP 133/74   Pulse (!) 107   Temp 98.6 F (37 C) (Oral)   Resp 15   Ht 5\' 5"  (1.651 m)   Wt 113.4 kg   SpO2 100%   BMI 41.60 kg/m   Physical Exam Vitals signs and nursing note reviewed.  Constitutional:      Appearance: He is well-developed.  HENT:     Head: Normocephalic.     Nose: Nose normal.  Eyes:     General: No scleral icterus.    Conjunctiva/sclera: Conjunctivae normal.  Neck:     Musculoskeletal: Neck supple.     Thyroid: No thyromegaly.  Cardiovascular:     Rate and Rhythm: Normal rate and regular rhythm.     Heart sounds: No murmur. No friction rub. No gallop.   Pulmonary:     Breath sounds: No stridor. No wheezing or rales.  Chest:     Chest wall: Tenderness present.  Abdominal:     General: There is no distension.     Tenderness: There is no abdominal tenderness. There is no rebound.  Musculoskeletal: Normal range of motion.  Lymphadenopathy:     Cervical: No cervical adenopathy.  Skin:    Findings: No erythema or rash.  Neurological:     Mental Status: He is oriented to person, place, and time.     Motor: No abnormal muscle tone.     Coordination: Coordination normal.  Psychiatric:        Behavior: Behavior normal.      ED Treatments / Results  Labs (all labs ordered are listed, but only abnormal results are displayed) Labs Reviewed  CBC - Abnormal; Notable for the following components:      Result Value   WBC 11.6 (*)    All  other components within normal limits  BASIC METABOLIC PANEL  TROPONIN I  D-DIMER, QUANTITATIVE (NOT AT [facility])    EKG EKG Interpretation  Date/Time:  Wednesday January 09 2019 17:34:23 EDT Ventricular Rate:  99 PR Interval:  134 QRS Duration: 86 QT Interval:  338 QTC Calculation: 433 R Axis:   34 Text Interpretation:  Normal sinus rhythm Nonspecific T wave abnormality Abnormal ECG Confirmed by Bethann BerkshireZammit, Moncerrat Burnstein [phone]([id number]) on 01/09/2019 7:54:15 PM   Radiology Dg Chest 2 View  Result Date: 01/09/2019 CLINICAL DATA:  Left-sided chest pain that radiates into arm. EXAM: CHEST - 2 VIEW COMPARISON:  07/28/2017  FINDINGS: The heart size and mediastinal contours are within normal limits. Both lungs are clear. The visualized skeletal structures are unremarkable. IMPRESSION: No active cardiopulmonary disease. Electronically Signed   By: Constance Holster M.D.   On: 01/09/2019 18:02    Procedures Procedures (including critical care time)  Medications Ordered in ED Medications  sodium chloride flush (NS) 0.9 % injection 3 mL (3 mLs Intravenous Not Given 01/09/19 1944)     Initial Impression / Assessment and Plan / ED Course  I have reviewed the triage vital signs and the nursing notes.  Pertinent labs & imaging results that were available during my care of the patient were reviewed by me and considered in my medical decision making (see chart for details).       Left-sided chest wall tenderness.  Patient will be placed on Motrin and will follow-up with PCP  Final Clinical Impressions(s) / ED Diagnoses   Final diagnoses:  Chest wall pain    ED Discharge Orders         Ordered    ibuprofen (ADVIL) 800 MG tablet  Every 8 hours PRN     01/09/19 2136           Milton Ferguson, MD 01/09/19 2138

## 2019-01-09 NOTE — ED Triage Notes (Signed)
Patient reports L sided chest pain with radiation down his arm that started last night.

## 2019-01-09 NOTE — ED Notes (Signed)
Pt chest pain increased when sitting down on the bed. Reports cp worsening with movement

## 2019-03-18 ENCOUNTER — Other Ambulatory Visit: Payer: Self-pay

## 2019-03-18 ENCOUNTER — Ambulatory Visit (INDEPENDENT_AMBULATORY_CARE_PROVIDER_SITE_OTHER): Payer: Medicaid Other | Admitting: Family Medicine

## 2019-03-18 VITALS — Ht 65.0 in | Wt 240.0 lb

## 2019-03-18 DIAGNOSIS — R062 Wheezing: Secondary | ICD-10-CM | POA: Diagnosis not present

## 2019-03-18 DIAGNOSIS — J019 Acute sinusitis, unspecified: Secondary | ICD-10-CM

## 2019-03-18 MED ORDER — AZITHROMYCIN 250 MG PO TABS: ORAL_TABLET | ORAL | 0 refills | Status: DC

## 2019-03-18 MED ORDER — ALBUTEROL SULFATE HFA 108 (90 BASE) MCG/ACT IN AERS: 2.0000 | INHALATION_SPRAY | RESPIRATORY_TRACT | 2 refills | Status: DC | PRN

## 2019-03-18 MED ORDER — PREDNISONE 20 MG PO TABS
ORAL_TABLET | ORAL | 0 refills | Status: DC
Start: 2019-03-18 — End: 2020-01-24

## 2019-03-18 NOTE — Progress Notes (Signed)
   Subjective:    Patient ID: Elijah [name], male    DOB: 05/17/1996, 23 y.o.   MRN: [id number] Telephone visit video not possible HPI congestion for one week. Trouble breathing through nose. Tried allergy medication and dayquil.  Patient has ongoing history of allergies and over the past week has had increased stuffiness some sneezing also relates little bit of congestion in the throat and in his chest with some wheezing denies fevers headaches body aches denies myalgias.  PMH allergies reactive airway Requesting refill on albuterol inhaler. Lost the one he had.   Virtual Visit via Telephone Note  I connected with BRUK TUMOLO on 03/18/19 at 11:30 AM EDT by telephone and verified that I am speaking with the correct person using two identifiers.  Location: Patient: home Provider: office   I discussed the limitations, risks, security and privacy concerns of performing an evaluation and management service by telephone and the availability of in person appointments. I also discussed with the patient that there may be a patient responsible charge related to this service. The patient expressed understanding and agreed to proceed.   History of Present Illness:    Observations/Objective:   Assessment and Plan:   Follow Up Instructions:    I discussed the assessment and treatment plan with the patient. The patient was provided an opportunity to ask questions and all were answered. The patient agreed with the plan and demonstrated an understanding of the instructions.   The patient was advised to call back or seek an in-person evaluation if the symptoms worsen or if the condition fails to improve as anticipated.  I provided 15 minutes of non-face-to-face time during this encounter.    Review of Systems  Constitutional: Negative for activity change, chills and fever.  HENT: Positive for congestion and rhinorrhea. Negative for ear pain.   Eyes: Negative for discharge.   Respiratory: Positive for cough. Negative for wheezing.   Cardiovascular: Negative for chest pain.  Gastrointestinal: Negative for nausea and vomiting.  Musculoskeletal: Negative for arthralgias.       Objective:   Physical Exam  Today's visit was via telephone Physical exam was not possible for this visit       Assessment & Plan:  I did raise the possibility of COVID with the patient recommending testing we would like to put this off currently.  He states he will go if it does not clear up over the next few days  Albuterol for reactive airway 2 puffs every 4 as needed warning signs discussed go to ER if worse  Short course prednisone 20 mg 2 daily for the next 5 days  Z-Pak as directed for acute rhinosinusitis.  Warning signs discussed follow-up if problems

## 2019-04-06 ENCOUNTER — Other Ambulatory Visit: Payer: Self-pay

## 2019-04-06 ENCOUNTER — Emergency Department
Admission: EM | Admit: 2019-04-06 | Discharge: 2019-04-06 | Disposition: A | Discharge status: Home / Self Care | Payer: Medicaid Other | Attending: Emergency Medicine | Admitting: Emergency Medicine

## 2019-04-06 ENCOUNTER — Encounter: Payer: Self-pay | Admitting: Emergency Medicine

## 2019-04-06 DIAGNOSIS — Z5321 Procedure and treatment not carried out due to patient leaving prior to being seen by health care provider: Secondary | ICD-10-CM | POA: Insufficient documentation

## 2019-04-06 DIAGNOSIS — M79603 Pain in arm, unspecified: Secondary | ICD-10-CM | POA: Diagnosis not present

## 2019-04-06 NOTE — ED Triage Notes (Signed)
Pt arrived via POV with reports bilateral arm pain and congestion.  Pt has been taking medication for his congestion. Denies any COVID contacts.

## 2019-04-06 NOTE — ED Notes (Signed)
Per tech when getting patient room stated they were leaving rather than being seen.

## 2019-04-15 ENCOUNTER — Ambulatory Visit (INDEPENDENT_AMBULATORY_CARE_PROVIDER_SITE_OTHER): Payer: Medicaid Other | Admitting: Family Medicine

## 2019-04-15 ENCOUNTER — Other Ambulatory Visit: Payer: Self-pay

## 2019-04-15 ENCOUNTER — Encounter: Payer: Self-pay | Admitting: Family Medicine

## 2019-04-15 ENCOUNTER — Telehealth: Payer: Self-pay | Admitting: *Deleted

## 2019-04-15 DIAGNOSIS — R6889 Other general symptoms and signs: Secondary | ICD-10-CM

## 2019-04-15 DIAGNOSIS — Z20822 Contact with and (suspected) exposure to covid-19: Secondary | ICD-10-CM

## 2019-04-15 DIAGNOSIS — J019 Acute sinusitis, unspecified: Secondary | ICD-10-CM

## 2019-04-15 MED ORDER — CEFPROZIL 500 MG PO TABS: 500.0000 mg | ORAL_TABLET | Freq: Two times a day (BID) | ORAL | 0 refills | Status: DC

## 2019-04-15 NOTE — Progress Notes (Signed)
   Subjective:  Audio plus video  Patient ID: ARLO BUTT, male    DOB: 11/16/1995, 23 y.o.   MRN: [id number]  Sinusitis This is a new problem. Episode onset: 2 days. There has been no fever. (Ear pain, headaches, runny nose) Past treatments include acetaminophen.   Needs refill on nexium 40mg .   Virtual Visit via Telephone Note  I connected with PINK MAYE on 04/15/19 at  9:30 AM EDT by telephone and verified that I am speaking with the correct person using two identifiers.  Location: Patient: home Provider: office   I discussed the limitations, risks, security and privacy concerns of performing an evaluation and management service by telephone and the availability of in person appointments. I also discussed with the patient that there may be a patient responsible charge related to this service. The patient expressed understanding and agreed to proceed.   History of Present Illness:    Observations/Objective:   Assessment and Plan:   Follow Up Instructions:    I discussed the assessment and treatment plan with the patient. The patient was provided an opportunity to ask questions and all were answered. The patient agreed with the plan and demonstrated an understanding of the instructions.   The patient was advised to call back or seek an in-person evaluation if the symptoms worsen or if the condition fails to improve as anticipated.  I provided 19 minutes of non-face-to-face time during this encounter.  Patient notes progressive nasal congestion.  Frontal headache.  Intermittent in nature.  Ear pain.  Right more than left.  Achy at times.  No obvious fever.  No sore throat.  Slight cough with drainage.  No shortness of breath no chest discomfort   Review of Systems No headache, no major weight loss or weight gain, no chest pain no back pain abdominal pain no change in bowel habits complete ROS otherwise negative     Objective:   Physical Exam  Virtual     Assessment & Plan:  Impression rhinosinusitis potential otitis media.  Potential viral syndrome discussed.  COVID-19 test warranted discussed will help set up

## 2019-04-15 NOTE — Telephone Encounter (Signed)
Pt had a virtual today and dr Richardson Landry want to send patient for covid testing. Left message to return call to let pt know where to go for testing.

## 2019-04-16 ENCOUNTER — Other Ambulatory Visit: Payer: Self-pay

## 2019-04-16 DIAGNOSIS — Z20822 Contact with and (suspected) exposure to covid-19: Secondary | ICD-10-CM

## 2019-04-16 NOTE — Telephone Encounter (Signed)
Pt did call back yesterday and I notified him where to go for covid testing.

## 2019-04-17 LAB — NOVEL CORONAVIRUS, NAA: SARS-CoV-2, NAA: NOT DETECTED

## 2019-08-07 ENCOUNTER — Telehealth: Payer: Self-pay | Admitting: Family Medicine

## 2019-08-07 MED ORDER — PENICILLIN V POTASSIUM 500 MG PO TABS: 500.0000 mg | ORAL_TABLET | Freq: Four times a day (QID) | ORAL | 0 refills | Status: DC

## 2019-08-07 MED ORDER — IBUPROFEN 600 MG PO TABS: 600.0000 mg | ORAL_TABLET | Freq: Four times a day (QID) | ORAL | 0 refills | Status: DC | PRN

## 2019-08-07 NOTE — Telephone Encounter (Signed)
Prescriptions sent electronically to pharmacy. Patient notified. °

## 2019-08-07 NOTE — Telephone Encounter (Signed)
Pt is having a lot of pain in mouth and is swollen. Pt states he is some teeth pulled on 08/28/2019. Tylenol is not helping. Please advise. Thank you

## 2019-08-07 NOTE — Telephone Encounter (Signed)
Penicillin VK 500 mg 1 4 times daily for 7 days Also ibuprofen 600 mg 1 every 6 hours as needed severe pain, #30, Follow through with seeing dentist

## 2019-08-07 NOTE — Telephone Encounter (Signed)
Patient is requesting something for teeth pain cant be seen by dentist until 2/3. [facility] Please advise

## 2020-01-02 ENCOUNTER — Telehealth: Payer: Self-pay | Admitting: Family Medicine

## 2020-01-02 ENCOUNTER — Other Ambulatory Visit: Payer: Self-pay

## 2020-01-02 ENCOUNTER — Telehealth (INDEPENDENT_AMBULATORY_CARE_PROVIDER_SITE_OTHER): Payer: Medicaid Other | Admitting: Family Medicine

## 2020-01-02 DIAGNOSIS — L209 Atopic dermatitis, unspecified: Secondary | ICD-10-CM

## 2020-01-02 MED ORDER — TRIAMCINOLONE ACETONIDE 0.1 % EX CREA: TOPICAL_CREAM | CUTANEOUS | 0 refills | Status: DC

## 2020-01-02 NOTE — Telephone Encounter (Signed)
[name], welcher are scheduled for a virtual visit with your provider today.    Just as we do with appointments in the office, we must obtain your consent to participate.  Your consent will be active for this visit and any virtual visit you may have with one of our providers in the next 365 days.    If you have a MyChart account, I can also send a copy of this consent to you electronically.  All virtual visits are billed to your insurance company just like a traditional visit in the office.  As this is a virtual visit, video technology does not allow for your provider to perform a traditional examination.  This may limit your provider's ability to fully assess your condition.  If your provider identifies any concerns that need to be evaluated in person or the need to arrange testing such as labs, EKG, etc, we will make arrangements to do so.    Although advances in technology are sophisticated, we cannot ensure that it will always work on either your end or our end.  If the connection with a video visit is poor, we may have to switch to a telephone visit.  With either a video or telephone visit, we are not always able to ensure that we have a secure connection.   I need to obtain your verbal consent now.   Are you willing to proceed with your visit today?   Elijah [name] has provided verbal consent on 01/02/2020 for a virtual visit (video or telephone).   Marlowe Shores, LPN 2/99/3716  96:78 AM

## 2020-01-02 NOTE — Progress Notes (Signed)
   Subjective:    Patient ID: Elijah [name], male    DOB: 31-Aug-1995, 24 y.o.   MRN: [id number]  HPI Pt has a rash on both arms. Pt states it has been there for years. He went to give blood but they would not let him give blood because of the rash.  The patient states that he had a.  In some spots on a day were concerned about the possibility of infection versus other reasons so therefore they told him it needed to get checked out he states he thinks is just a little bit of average he states he would like to have a steroid cream that he can try on Virtual Visit via Video Note  I connected with [name] on 01/02/20 at  1:10 PM EDT by a video enabled telemedicine application and verified that I am speaking with the correct person using two identifiers.  Location: Patient: home Provider: office   I discussed the limitations of evaluation and management by telemedicine and the availability of in person appointments. The patient expressed understanding and agreed to proceed.  History of Present Illness:    Observations/Objective:   Assessment and Plan:   Follow Up Instructions:    I discussed the assessment and treatment plan with the patient. The patient was provided an opportunity to ask questions and all were answered. The patient agreed with the plan and demonstrated an understanding of the instructions.   The patient was advised to call back or seek an in-person evaluation if the symptoms worsen or if the condition fails to improve as anticipated.  I provided 12 minutes of non-face-to-face time during this encounter.      Review of Systems    Denies fever chills sweats Objective:   Physical Exam   Unable to do physical exam because of it being telephone visit     Assessment & Plan:  Possible atopic dermatitis steroid cream twice daily over the next 7 to 10 days if ongoing troubles next step would be office visit for further delineation

## 2020-01-06 ENCOUNTER — Telehealth: Payer: Self-pay | Admitting: Family Medicine

## 2020-01-06 NOTE — Telephone Encounter (Signed)
Pt has a question in regards to the appt he had last week and would like a call from nurse or provider.

## 2020-01-06 NOTE — Telephone Encounter (Signed)
Pt wanted a form filled out stating he had a rash. He was seen last Friday for the rash. I told him he could drop off form but he wanted an appt to see dr scott to have filled out at appt. Transferred to front to give appt.

## 2020-01-06 NOTE — Telephone Encounter (Signed)
Left message to return call 

## 2020-01-24 ENCOUNTER — Encounter: Payer: Self-pay | Admitting: Family Medicine

## 2020-01-24 ENCOUNTER — Ambulatory Visit (INDEPENDENT_AMBULATORY_CARE_PROVIDER_SITE_OTHER): Payer: Medicaid Other | Admitting: Family Medicine

## 2020-01-24 ENCOUNTER — Other Ambulatory Visit: Payer: Self-pay

## 2020-01-24 VITALS — BP 120/76 | Temp 98.4°F | Ht 65.0 in | Wt 239.0 lb

## 2020-01-24 DIAGNOSIS — L309 Dermatitis, unspecified: Secondary | ICD-10-CM | POA: Diagnosis not present

## 2020-01-24 DIAGNOSIS — T148XXA Other injury of unspecified body region, initial encounter: Secondary | ICD-10-CM

## 2020-01-24 MED ORDER — ALBUTEROL SULFATE HFA 108 (90 BASE) MCG/ACT IN AERS: 2.0000 | INHALATION_SPRAY | RESPIRATORY_TRACT | 2 refills | Status: DC | PRN

## 2020-01-24 MED ORDER — TRIAMCINOLONE ACETONIDE 0.1 % EX CREA: TOPICAL_CREAM | CUTANEOUS | 0 refills | Status: DC

## 2020-01-24 MED ORDER — CEPHALEXIN 500 MG PO CAPS: 500.0000 mg | ORAL_CAPSULE | Freq: Three times a day (TID) | ORAL | 0 refills | Status: DC

## 2020-01-24 NOTE — Progress Notes (Signed)
   Subjective:    Patient ID: Elijah [name], male    DOB: 1996-06-13, 24 y.o.   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 Pt went to donate and was unable due to rash on arms.  Patient has an unusual rash on the folds of his arms.  He denies any problems have triggered this.  States is been going on for a while.  Itches intermittently. Would like a refill on albuterol inhaler.   Denies any bad breathing problems lately Review of Systems See above    Objective:   Physical Exam Lungs clear respiratory rate normal heart regular there is a papular rash on both arms in the folds of the arm possibly a papular eczema       Assessment & Plan:  Eczema try steroid cream if ongoing troubles or worsening issues referral to dermatology Albuterol refills given per request As for him donating plasma I do not see any reason he cannot do so form was filled out Also has some infection of the toe where he scraped it on a rock

## 2020-03-24 ENCOUNTER — Other Ambulatory Visit: Payer: Self-pay | Admitting: Family Medicine

## 2020-03-24 ENCOUNTER — Ambulatory Visit (INDEPENDENT_AMBULATORY_CARE_PROVIDER_SITE_OTHER): Payer: Medicaid Other | Admitting: Family Medicine

## 2020-03-24 ENCOUNTER — Other Ambulatory Visit: Payer: Self-pay

## 2020-03-24 ENCOUNTER — Encounter: Payer: Self-pay | Admitting: Family Medicine

## 2020-03-24 VITALS — HR 108 | Temp 98.3°F | Resp 16

## 2020-03-24 DIAGNOSIS — J029 Acute pharyngitis, unspecified: Secondary | ICD-10-CM

## 2020-03-24 LAB — POCT RAPID STREP A (OFFICE): Rapid Strep A Screen: NEGATIVE

## 2020-03-24 NOTE — Patient Instructions (Signed)
I am testing you today for strep throat and Covid 19. When you are being tested for Covid, it is recommended that you stay at home and not go out in public in case you are positive.    Information on MyChart will be included in these instructions.    Your illness is likely caused by a virus. I recommend supportive therapy to help you to feel better.  1) Get lots of rest.  2) Take over the counter pain medication if needed, such as acetaminophen or ibuprofen. Read and follow instructions on the label and make sure not to combine other medications that may have same ingredients in it. It is important to not take too much of these ingredients.  3) Drink plenty of caffeine-free fluids. (If you have heart or kidney problems, follow the instructions of your specialist regarding amounts).  4) If you are hungry, eat a bland diet, such as the BRAT diet (bananas, rice, applesauce, toast).  5) Let us know if you are not feeling better in a week.  Viral Illness, Adult Viruses are tiny germs that can get into a person's body and cause illness. There are many different types of viruses, and they cause many types of illness. Viral illnesses can range from mild to severe. They can affect various parts of the body. Common illnesses that are caused by a virus include colds and the flu. Viral illnesses also include serious conditions such as HIV/AIDS (human immunodeficiency virus/acquired immunodeficiency syndrome). A few viruses have been linked to certain cancers. What are the causes? Many types of viruses can cause illness. Viruses invade cells in your body, multiply, and cause the infected cells to malfunction or die. When the cell dies, it releases more of the virus. When this happens, you develop symptoms of the illness, and the virus continues to spread to other cells. If the virus takes over the function of the cell, it can cause the cell to divide and grow out of control, as is the case when a virus  causes cancer. Different viruses get into the body in different ways. You can get a virus by:  Swallowing food or water that is contaminated with the virus.  Breathing in droplets that have been coughed or sneezed into the air by an infected person.  Touching a surface that has been contaminated with the virus and then touching your eyes, nose, or mouth.  Being bitten by an insect or animal that carries the virus.  Having sexual contact with a person who is infected with the virus.  Being exposed to blood or fluids that contain the virus, either through an open cut or during a transfusion. If a virus enters your body, your body's defense system (immune system) will try to fight the virus. You may be at higher risk for a viral illness if your immune system is weak. What are the signs or symptoms? Symptoms vary depending on the type of virus and the location of the cells that it invades. Common symptoms of the main types of viral illnesses include: Cold and flu viruses  Fever.  Headache.  Sore throat.  Muscle aches.  Nasal congestion.  Cough. Digestive system (gastrointestinal) viruses  Fever.  Abdominal pain.  Nausea.  Diarrhea. Liver viruses (hepatitis)  Loss of appetite.  Tiredness.  Yellowing of the skin (jaundice). Brain and spinal cord viruses  Fever.  Headache.  Stiff neck.  Nausea and vomiting.  Confusion or sleepiness. Skin viruses  Warts.  Itching.  Rash. Sexually  transmitted viruses  Discharge.  Swelling.  Redness.  Rash. How is this treated? Viruses can be difficult to treat because they live within cells. Antibiotic medicines do not treat viruses because these drugs do not get inside cells. Treatment for a viral illness may include:  Resting and drinking plenty of fluids.  Medicines to relieve symptoms. These can include over-the-counter medicine for pain and fever, medicines for cough or congestion, and medicines to relieve  diarrhea.  Antiviral medicines. These drugs are available only for certain types of viruses. They may help reduce flu symptoms if taken early. There are also many antiviral medicines for hepatitis and HIV/AIDS. Some viral illnesses can be prevented with vaccinations. A common example is the flu shot. Follow these instructions at home: Medicines   Take over-the-counter and prescription medicines only as told by your health care provider.  If you were prescribed an antiviral medicine, take it as told by your health care provider. Do not stop taking the medicine even if you start to feel better.  Be aware of when antibiotics are needed and when they are not needed. Antibiotics do not treat viruses. If your health care provider thinks that you may have a bacterial infection as well as a viral infection, you may get an antibiotic. ? Do not ask for an antibiotic prescription if you have been diagnosed with a viral illness. That will not make your illness go away faster. ? Frequently taking antibiotics when they are not needed can lead to antibiotic resistance. When this develops, the medicine no longer works against the bacteria that it normally fights. General instructions  Drink enough fluids to keep your urine clear or pale yellow.  Rest as much as possible.  Return to your normal activities as told by your health care provider. Ask your health care provider what activities are safe for you.  Keep all follow-up visits as told by your health care provider. This is important. How is this prevented? Take these actions to reduce your risk of viral infection:  Eat a healthy diet and get enough rest.  Wash your hands often with soap and water. This is especially important when you are in public places. If soap and water are not available, use hand sanitizer.  Avoid close contact with friends and family who have a viral illness.  If you travel to areas where viral gastrointestinal infection is  common, avoid drinking water or eating raw food.  Keep your immunizations up to date. Get a flu shot every year as told by your health care provider.  Do not share toothbrushes, nail clippers, razors, or needles with other people.  Always practice safe sex.  Contact a health care provider if:  You have symptoms of a viral illness that do not go away.  Your symptoms come back after going away.  Your symptoms get worse. Get help right away if:  You have trouble breathing.  You have a severe headache or a stiff neck.  You have severe vomiting or abdominal pain. This information is not intended to replace advice given to you by your health care provider. Make sure you discuss any questions you have with your health care provider. Document Revised: 06/23/2017 Document Reviewed: 11/20/2015 Elsevier Patient Education  2020 [employer].

## 2020-03-24 NOTE — Progress Notes (Signed)
Patient ID: Elijah [name], male    DOB: [birth year], 24 y.o.   MRN: [id number]   Chief Complaint  Patient presents with  . Swallow Issues   Subjective:    HPI  Pt states that for the past few day he has been unable to swallow and his nose has been running for the past couple of morning. No other symptoms.    Medical History Elijah [name] has a past medical history of Acid reflux, ADD (attention deficit disorder), ADHD (attention deficit hyperactivity disorder), Anxiety, Autism, Learning disability, and Mild mental slowing.   Outpatient Encounter Medications as of 03/24/2020  Medication Sig  . albuterol (VENTOLIN HFA) 108 (90 Base) MCG/ACT inhaler Inhale 2 puffs into the lungs every 4 (four) hours as needed for wheezing or shortness of breath.  . cephALEXin (KEFLEX) 500 MG capsule Take 1 capsule (500 mg total) by mouth 3 (three) times daily.  Marland Kitchen triamcinolone cream (KENALOG) 0.1 % Apply twice daily prn   No facility-administered encounter medications on file as of 03/24/2020.     Review of Systems  Constitutional: Negative for chills and fever.  HENT: Positive for sore throat and trouble swallowing. Negative for ear pain and voice change.        Nasal congestion is not unusual for Elijah [name].  Eyes: Negative.   Respiratory: Negative for cough and shortness of breath.   Cardiovascular: Negative for chest pain.  Gastrointestinal: Negative for abdominal pain.  Hematological: Positive for adenopathy.       Bilateral anterior cervical chain tenderness.     Vitals Pulse (!) 108   Temp 98.3 F (36.8 C)   Resp 16   SpO2 97%   Objective:   Physical Exam Constitutional:      Appearance: He is ill-appearing. He is not toxic-appearing.  HENT:     Mouth/Throat:     Mouth: Mucous membranes are moist.     Pharynx: Pharyngeal swelling and posterior oropharyngeal erythema present. No oropharyngeal exudate or uvula swelling.     Tonsils: No tonsillar exudate or tonsillar abscesses. 2+ on  the right. 3+ on the left.  Cardiovascular:     Rate and Rhythm: Regular rhythm.     Heart sounds: Normal heart sounds.  Pulmonary:     Breath sounds: Normal breath sounds.  Musculoskeletal:        General: Normal range of motion.  Lymphadenopathy:     Cervical: Cervical adenopathy present.  Skin:    General: Skin is warm and dry.  Neurological:     Mental Status: He is alert and oriented to person, place, and time.  Psychiatric:        Behavior: Behavior normal. Behavior is cooperative.         Assessment and Plan   1. Sore throat - Novel Coronavirus, NAA ([facility]) - POCT rapid strep A - Grp A Strep   Your illness is likely caused by a virus. I recommend supportive therapy to help you to feel better.  1) Get lots of rest.  2) Take over the counter pain medication if needed, such as acetaminophen or ibuprofen. Read and follow instructions on the label and make sure not to combine other medications that may have same ingredients in it. It is important to not take too much of these ingredients. Salt water gargles may help your throat feel better too,  3) Drink plenty of caffeine-free fluids. (If you have heart or kidney problems, follow the instructions of your specialist regarding amounts).  4) If  you are hungry, eat a bland diet, such as the BRAT diet (bananas, rice, applesauce, toast).  5) Let us know if you are not feeling better in a week.   Elijah [name] presents as an acute outside visit today. He is having difficulty swallowing due to sore throat. His tonsils are swollen.  Rapid strep and Covid testing done today. Rapid strep is negative (will send for culture) and we will notify once Covid results are available. He reports that he does not go anywhere away from home.   Agrees with plan of care discussed today. Understands warning signs to seek further care: fever, worsening sore throat.  Understands to follow-up if symptoms do not improve or if anything changes.  He  will check results on My Chart.   Elijah Olive, NP 03/24/2020

## 2020-03-25 LAB — SPECIMEN STATUS REPORT

## 2020-03-25 LAB — NOVEL CORONAVIRUS, NAA: SARS-CoV-2, NAA: NOT DETECTED

## 2020-03-25 LAB — STREP A DNA PROBE: Strep Gp A Direct, DNA Probe: NEGATIVE

## 2021-09-14 ENCOUNTER — Other Ambulatory Visit: Payer: Self-pay

## 2021-09-14 ENCOUNTER — Ambulatory Visit (INDEPENDENT_AMBULATORY_CARE_PROVIDER_SITE_OTHER): Payer: Medicaid Other | Admitting: Family Medicine

## 2021-09-14 ENCOUNTER — Encounter: Payer: Self-pay | Admitting: Family Medicine

## 2021-09-14 VITALS — HR 101 | Temp 97.1°F | Wt 250.0 lb

## 2021-09-14 DIAGNOSIS — J019 Acute sinusitis, unspecified: Secondary | ICD-10-CM

## 2021-09-14 MED ORDER — AZITHROMYCIN 250 MG PO TABS: ORAL_TABLET | ORAL | 0 refills | Status: AC

## 2021-09-14 MED ORDER — ALBUTEROL SULFATE HFA 108 (90 BASE) MCG/ACT IN AERS
2.0000 | INHALATION_SPRAY | RESPIRATORY_TRACT | 2 refills | Status: DC | PRN
Start: 2021-09-14 — End: 2023-08-15

## 2021-09-14 NOTE — Progress Notes (Signed)
° °  Subjective:    Patient ID: Elijah [name], male    DOB: [birth year], 26 y.o.   MRN: [id number]  HPI Pt had COVID 2 weeks ago and is still having congestion and cough. Pt states when he coughs he begins to feel dizzy. Not able to sleep well due to pressure in head. Mucinex has not gave any relief. Patient had COVID Now having a lot of head congestion chest congestion coughing symptoms been going on since COVID denies any wheezing or difficulty breathing  Review of Systems     Objective:   Physical Exam  General-in no acute distress Eyes-no discharge Lungs-respiratory rate normal, CTA CV-no murmurs,RRR Extremities skin warm dry no edema Neuro grossly normal Behavior normal, alert       Assessment & Plan:  Obesity patient is working hard on watching portions staying active  Acute rhinosinusitis antibiotics prescribed warning signs discussed Follow-up for wellness visits

## 2021-12-04 ENCOUNTER — Emergency Department (HOSPITAL_COMMUNITY)
Admission: EM | Admit: 2021-12-04 | Discharge: 2021-12-04 | Disposition: A | Discharge status: Home / Self Care | Payer: Medicaid Other | Attending: Emergency Medicine | Admitting: Emergency Medicine

## 2021-12-04 ENCOUNTER — Encounter (HOSPITAL_COMMUNITY): Payer: Self-pay | Admitting: Emergency Medicine

## 2021-12-04 ENCOUNTER — Other Ambulatory Visit: Payer: Self-pay

## 2021-12-04 DIAGNOSIS — L02229 Furuncle of trunk, unspecified: Secondary | ICD-10-CM | POA: Insufficient documentation

## 2021-12-04 DIAGNOSIS — R197 Diarrhea, unspecified: Secondary | ICD-10-CM | POA: Insufficient documentation

## 2021-12-04 DIAGNOSIS — R5383 Other fatigue: Secondary | ICD-10-CM | POA: Insufficient documentation

## 2021-12-04 DIAGNOSIS — L02211 Cutaneous abscess of abdominal wall: Secondary | ICD-10-CM | POA: Diagnosis present

## 2021-12-04 DIAGNOSIS — F840 Autistic disorder: Secondary | ICD-10-CM | POA: Insufficient documentation

## 2021-12-04 MED ORDER — DOXYCYCLINE HYCLATE 100 MG PO CAPS: 100.0000 mg | ORAL_CAPSULE | Freq: Two times a day (BID) | ORAL | 0 refills | Status: DC

## 2021-12-04 NOTE — ED Triage Notes (Addendum)
Patient c/o abscess to right flank that started yesterday and is progressively getting worse. Denies any drainage or fever. Per patient hot to touch. Patient also reports having fatigue and diarrhea. Denies any nausea or vomiting.  ?

## 2021-12-04 NOTE — Discharge Instructions (Addendum)
The antibiotics I am prescribing you sensitive on the stomach recommend that you take them with a full meal, additionally I recommend you wear sunscreen or avoid the sun while you are taking this antibiotic as it can increase your sensitivity to the sun. ? ?Continue to use warm compresses multiple times per day, if you are able to express any drainage out of the cyst/boil this may help it to resolve sooner.  If it gets worse, redness worsens, you develop fever, chills please return to the emergency department for further evaluation, possible drainage. ?

## 2021-12-04 NOTE — ED Notes (Signed)
Patient states he did donate plasma on Thursday. ? ?

## 2021-12-04 NOTE — ED Provider Notes (Signed)
?New Site ?Provider Note ? ? ?CSN: [id number] ?Arrival date & time: 12/04/21  [id number] ? ?  ? ?History ? ?Chief Complaint  ?Patient presents with  ? Abscess  ? ? ?CINCH RIPPLE is a 26 y.o. male with past medical history significant for autism spectrum disorder, obesity presents with concern for infection, irritation of lesion on the right flank started yesterday and is getting worse.  He denies any drainage, fever, chills.  Patient reports that the lesion is hot to the touch.  He reports some intermittent fatigue, diarrhea but reports that these were happening prior to this lesion.  Patient does not necessarily remember getting bitten by anything.  He denies any known allergies. ? ? ?Abscess ? ?  ? ?Home Medications ?Prior to Admission medications   ?Medication Sig Start Date End Date Taking? Authorizing Provider  ?doxycycline (VIBRAMYCIN) 100 MG capsule Take 1 capsule (100 mg total) by mouth 2 (two) times daily. 12/04/21  Yes Tora Prunty H, PA-C  ?albuterol (VENTOLIN HFA) 108 (90 Base) MCG/ACT inhaler Inhale 2 puffs into the lungs every 4 (four) hours as needed for wheezing or shortness of breath. 09/14/21   Kathyrn Drown, MD  ?   ? ?Allergies    ?Patient has no known allergies.   ? ?Review of Systems   ?Review of Systems  ?Skin:  Positive for wound.  ?All other systems reviewed and are negative. ? ?Physical Exam ?Updated Vital Signs ?BP 137/90 (BP Location: Right Arm)   Pulse 92   Temp 97.9 ?F (36.6 ?C) (Oral)   Resp 20   Ht 5\' 5"  (1.651 m)   Wt 113.4 kg   SpO2 99%   BMI 41.60 kg/m?  ?Physical Exam ?Vitals and nursing note reviewed.  ?Constitutional:   ?   General: He is not in acute distress. ?   Appearance: Normal appearance.  ?HENT:  ?   Head: Normocephalic and atraumatic.  ?Eyes:  ?   General:     ?   Right eye: No discharge.     ?   Left eye: No discharge.  ?Cardiovascular:  ?   Rate and Rhythm: Normal rate and regular rhythm.  ?Pulmonary:  ?   Effort: Pulmonary effort is  normal. No respiratory distress.  ?Musculoskeletal:     ?   General: No deformity.  ?Skin: ?   General: Skin is warm and dry.  ?   Comments: With the appearance of a small irritated follicle versus boil versus cyst versus very early abscess on the right flank.  There is a red raised papule with punctate center without purulent drainage, with very minimal surrounding erythema.  No fluctuance, no deep area of tenderness or deep palpable abscess collection  ?Neurological:  ?   Mental Status: He is alert and oriented to person, place, and time.  ?Psychiatric:     ?   Mood and Affect: Mood normal.     ?   Behavior: Behavior normal.  ? ? ? ?ED Results / Procedures / Treatments   ?Labs ?(all labs ordered are listed, but only abnormal results are displayed) ?Labs Reviewed - No data to display ? ?EKG ?None ? ?Radiology ?No results found. ? ?Procedures ?Procedures  ? ? ?Medications Ordered in ED ?Medications - No data to display ? ?ED Course/ Medical Decision Making/ A&P ?  ?                        ?Medical Decision  Making ? ?This an overall well-appearing 26 year old male who presents with concern for abscess versus skin infection versus boil versus folliculitis versus bug bites on the right flank.  On evaluation I have much higher suspicion for boil versus irritated follicle versus insect bite versus irritated cyst.  Low clinical suspicion for acute abscess, or deep tissue cellulitis based on my exam.  The lesion in question is approximately 1 cm large with punctate center, there is 1 other area of pore formation, possibly could represent an insect bite, however no signs of skin necrosis, low clinical suspicion for black widow, brown recluse bite especially given the lesion has been present for greater than 1 day.  Discussed with patient that I would not recommend incision and drainage of such a small lesion at this time.  We will trial oral antibiotics, warm compresses, and conservative management.  Discussed extensive  return precautions.  Patient discharged in stable condition at this time. ?Final Clinical Impression(s) / ED Diagnoses ?Final diagnoses:  ?Boil of trunk  ? ? ?Rx / DC Orders ?ED Discharge Orders   ? ?      Ordered  ?  doxycycline (VIBRAMYCIN) 100 MG capsule  2 times daily       ? 12/04/21 1055  ? ?  ?  ? ?  ? ? ?  ?Anselmo Pickler, PA-C ?12/04/21 1057 ? ?  ?Dorie Rank, MD ?12/05/21 0750 ? ?

## 2022-05-27 ENCOUNTER — Ambulatory Visit: Payer: Medicaid Other | Admitting: Nurse Practitioner

## 2022-06-14 ENCOUNTER — Other Ambulatory Visit: Payer: Self-pay

## 2022-06-14 ENCOUNTER — Emergency Department (HOSPITAL_COMMUNITY)
Admission: EM | Admit: 2022-06-14 | Discharge: 2022-06-14 | Disposition: A | Discharge status: Home / Self Care | Payer: Medicaid Other | Attending: Emergency Medicine | Admitting: Emergency Medicine

## 2022-06-14 ENCOUNTER — Encounter (HOSPITAL_COMMUNITY): Payer: Self-pay | Admitting: *Deleted

## 2022-06-14 ENCOUNTER — Emergency Department (HOSPITAL_COMMUNITY): Payer: Medicaid Other

## 2022-06-14 DIAGNOSIS — S99921A Unspecified injury of right foot, initial encounter: Secondary | ICD-10-CM | POA: Diagnosis present

## 2022-06-14 DIAGNOSIS — X58XXXA Exposure to other specified factors, initial encounter: Secondary | ICD-10-CM | POA: Insufficient documentation

## 2022-06-14 DIAGNOSIS — S93601A Unspecified sprain of right foot, initial encounter: Secondary | ICD-10-CM | POA: Insufficient documentation

## 2022-06-14 MED ORDER — IBUPROFEN 800 MG PO TABS: 800.0000 mg | ORAL_TABLET | Freq: Three times a day (TID) | ORAL | 0 refills | Status: DC

## 2022-06-14 NOTE — ED Notes (Signed)
See triage notes nad. No swelling noted. Pedal pulses present.

## 2022-06-14 NOTE — Discharge Instructions (Signed)
Apply ice packs on and off to your foot to help with swelling.  Wear the brace as needed for support when walking or standing.  You may remove when resting, bathing and bedtime.  Call the orthopedic provider listed to arrange a follow-up appointment in 1 week if needed.

## 2022-06-14 NOTE — ED Triage Notes (Signed)
Pt c/o right foot x 2 months; pt states he injured it a couple of months ago but did not have it checked out

## 2022-06-14 NOTE — ED Provider Notes (Signed)
[facility] EMERGENCY DEPARTMENT Provider Note   CSN: [id number] Arrival date & time: 06/14/22  1053     History  Chief Complaint  Patient presents with   Foot Pain    Elijah [name] is a 26 y.o. male.   Foot Pain Pertinent negatives include no chest pain.        [name] is a 26 y.o. male who presents to the Emergency Department complaining of right foot pain x2 months.  He notes a mechanical fall 2 months ago with injury of his foot and ankle.  He was not evaluated medically after the injury.  He is continue to stand and walk without added support.  For the last several days, he has noticed increasing pain along the lateral aspect of his foot and intermittent swelling upon standing.  He denies any numbness or weakness of his foot or leg.  He also denies any wounds or discoloration.  He has tried Tylenol without relief of pain.   Home Medications Prior to Admission medications   Medication Sig Start Date End Date Taking? Authorizing Provider  albuterol (VENTOLIN HFA) 108 (90 Base) MCG/ACT inhaler Inhale 2 puffs into the lungs every 4 (four) hours as needed for wheezing or shortness of breath. 09/14/21   Babs Sciara, MD  doxycycline (VIBRAMYCIN) 100 MG capsule Take 1 capsule (100 mg total) by mouth 2 (two) times daily. 12/04/21   [name], Elijah H, PA-C      Allergies    Patient has no known allergies.    Review of Systems   Review of Systems  Constitutional:  Negative for chills and fever.  Respiratory:  Negative for cough.   Cardiovascular:  Negative for chest pain.  Musculoskeletal:  Positive for arthralgias (Right foot pain). Negative for back pain.  Skin:  Negative for color change and wound.  Neurological:  Negative for weakness and numbness.    Physical Exam Updated Vital Signs BP (!) 126/105 (BP Location: Right Arm)   Pulse 84   Temp 98.1 F (36.7 C) (Oral)   Resp 16   Ht 5\' 5"  (1.651 m)   Wt 108.9 kg   SpO2 100%   BMI 39.94 kg/m   Physical Exam Vitals and nursing note reviewed.  Constitutional:      General: He is not in acute distress.    Appearance: Normal appearance.  Cardiovascular:     Rate and Rhythm: Normal rate and regular rhythm.     Pulses: Normal pulses.  Pulmonary:     Effort: Pulmonary effort is normal.  Musculoskeletal:        General: Tenderness present. No signs of injury.     Comments: Minimal tenderness palpation along the lateral aspect of the right foot.  No significant edema, no abrasions or ecchymosis.  Bony deformity or tenderness proximal to the ankle.  Achilles tendon appears intact.  Skin:    General: Skin is warm.     Capillary Refill: Capillary refill takes less than 2 seconds.     Findings: No bruising or erythema.  Neurological:     General: No focal deficit present.     Mental Status: He is alert.     Sensory: No sensory deficit.     Motor: No weakness.     ED Results / Procedures / Treatments   Labs (all labs ordered are listed, but only abnormal results are displayed) Labs Reviewed - No data to display  EKG None  Radiology DG Foot Complete Right  Result  Date: 06/14/2022 CLINICAL DATA:  Right foot pain for 2 months from fall. EXAM: RIGHT FOOT COMPLETE - 3+ VIEW COMPARISON:  Right ankle radiographs 12/25/2015 and 07/29/2006 FINDINGS: Minimal lateral great toe metatarsophalangeal degenerative spurring without significant joint space narrowing. New minimal early plantar calcaneal heel spur. No acute fracture or dislocation. Small bone island within the posterior calcaneus is unchanged from 2017. IMPRESSION: 1. Minimal great toe metatarsophalangeal joint peripheral degenerative spurring. 2. New minimal early plantar calcaneal heel spur. Electronically Signed   By: Elijah [name] M.D.   On: 06/14/2022 12:01    Procedures Procedures    Medications Ordered in ED Medications - No data to display  ED Course/ Medical Decision Making/ A&P                           Medical  Decision Making Patient here for foot pain, endorses mechanical fall 2 months ago.  Pain gradually worsening for several days.  Denies recent injury.  On exam, he has mild tenderness to the lateral aspect of the foot.  No bony deformities, edema or skin changes.  No tenderness over the lateral malleolus.  I suspect his symptoms are related to sprain.  Low clinical suspicion for fracture but will obtain x-ray  Amount and/or Complexity of Data Reviewed Radiology: ordered.    Details: X-ray shows minimal early plantar calcaneal spur and minimal degenerative spurring of the metatarsal phalangeal joint of the great toe Discussion of management or test interpretation with external provider(s): ASO applied for support and comfort. Applied by nursing staff.  Remains NV intact.  Patient agreeable to symptomatic treatment, ibuprofen for pain,close outpatient follow-up with orthopedics if needed.           Final Clinical Impression(s) / ED Diagnoses Final diagnoses:  Sprain of right foot, initial encounter    Rx / DC Orders ED Discharge Orders     None         Elijah Parkinson, PA-C 06/14/22 1248    Elijah Pick, MD 06/14/22 [phone]

## 2022-06-27 ENCOUNTER — Ambulatory Visit
Admission: EM | Admit: 2022-06-27 | Discharge: 2022-06-27 | Disposition: A | Discharge status: Home / Self Care | Payer: Medicaid Other | Attending: Nurse Practitioner | Admitting: Nurse Practitioner

## 2022-06-27 DIAGNOSIS — Z1152 Encounter for screening for COVID-19: Secondary | ICD-10-CM | POA: Insufficient documentation

## 2022-06-27 DIAGNOSIS — J069 Acute upper respiratory infection, unspecified: Secondary | ICD-10-CM | POA: Diagnosis not present

## 2022-06-27 DIAGNOSIS — J029 Acute pharyngitis, unspecified: Secondary | ICD-10-CM | POA: Diagnosis present

## 2022-06-27 LAB — RESP PANEL BY RT-PCR (FLU A&B, COVID) ARPGX2
Influenza A by PCR: NEGATIVE
Influenza B by PCR: NEGATIVE
SARS Coronavirus 2 by RT PCR: NEGATIVE

## 2022-06-27 LAB — POCT RAPID STREP A (OFFICE): Rapid Strep A Screen: NEGATIVE

## 2022-06-27 MED ORDER — SALINE SPRAY 0.65 % NA SOLN: 1.0000 | NASAL | 0 refills | Status: DC | PRN

## 2022-06-27 MED ORDER — GUAIFENESIN ER 600 MG PO TB12: 600.0000 mg | ORAL_TABLET | Freq: Two times a day (BID) | ORAL | 0 refills | Status: DC | PRN

## 2022-06-27 NOTE — ED Provider Notes (Signed)
[facility]    CSN: [id number] Arrival date & time: 06/27/22  1015      History   Chief Complaint Chief Complaint  Patient presents with   Sore Throat    HPI Elijah SCHLAFER is a 26 y.o. male.   Patient presents for 1 day of chest tightness, chest and nasal congestion, postnasal drainage, sneezing, sore throat, headache, decreased appetite, loss of taste and smell, and fatigue.  No known fevers, cough, shortness of breath or chest pain, runny nose, ear pain, abdominal pain, nausea/vomiting, diarrhea.  Reports he was at his friend's house over the weekend and his daughter was sick.  Has been taking ibuprofen for symptoms with minimal relief.  Patient reports he has had 2 of the COVID-19 vaccines.    Past Medical History:  Diagnosis Date   Acid reflux    ADD (attention deficit disorder)    ADHD (attention deficit hyperactivity disorder)    Anxiety    Autism    Learning disability    Mild mental slowing     Patient Active Problem List   Diagnosis Date Noted   Sore throat 03/24/2020   Constipation 11/17/2017   Rectal bleeding 11/17/2017   Esophageal dysphagia 11/17/2017   GERD (gastroesophageal reflux disease) 03/09/2017   Adjustment disorder with mixed anxiety and depressed mood 03/09/2017   Autism spectrum disorder 03/08/2017   Intellectual disability 11/04/2016   Auditory hallucinations    Learning disability 12/21/2014   ADD (attention deficit disorder) 11/24/2012   Morbid obesity (HCC) 11/24/2012    Past Surgical History:  Procedure Laterality Date   ESOPHAGOGASTRODUODENOSCOPY (EGD) WITH PROPOFOL N/A 11/30/2017   Procedure: ESOPHAGOGASTRODUODENOSCOPY (EGD) WITH PROPOFOL;  Surgeon: Corbin Ade, MD;  Location: [facility];  Service: Endoscopy;  Laterality: N/A;  1:00pm   MALONEY DILATION N/A 11/30/2017   Procedure: Elease Hashimoto DILATION;  Surgeon: Corbin Ade, MD;  Location: [facility];  Service: Endoscopy;  Laterality: N/A;       Home  Medications    Prior to Admission medications   Medication Sig Start Date End Date Taking? Authorizing Provider  guaiFENesin (MUCINEX) 600 MG 12 hr tablet Take 1 tablet (600 mg total) by mouth 2 (two) times daily as needed for cough or to loosen phlegm. 06/27/22  Yes Cathlean Marseilles A, NP  sodium chloride (OCEAN) 0.65 % SOLN nasal spray Place 1 spray into both nostrils as needed for congestion. 06/27/22  Yes Valentino Nose, NP  albuterol (VENTOLIN HFA) 108 (90 Base) MCG/ACT inhaler Inhale 2 puffs into the lungs every 4 (four) hours as needed for wheezing or shortness of breath. 09/14/21   Babs Sciara, MD  ibuprofen (ADVIL) 800 MG tablet Take 1 tablet (800 mg total) by mouth 3 (three) times daily. Take with food 06/14/22   Pauline Aus, PA-C    Family History Family History  Problem Relation Age of Onset   Obesity Mother        gastric sleeve   Colon cancer Neg Hx     Social History Social History   Tobacco Use   Smoking status: Never   Smokeless tobacco: Never  Vaping Use   Vaping Use: Former  Substance Use Topics   Alcohol use: Yes    Comment: occassional   Drug use: No     Allergies   Patient has no known allergies.   Review of Systems Review of Systems Per HPI  Physical Exam Triage Vital Signs ED Triage Vitals  Enc Vitals Group  BP 06/27/22 1234 133/87     Pulse Rate 06/27/22 1234 85     Resp 06/27/22 1234 18     Temp 06/27/22 1234 98.1 F (36.7 C)     Temp Source 06/27/22 1234 Oral     SpO2 06/27/22 1234 97 %     Weight --      Height --      Head Circumference --      Peak Flow --      Pain Score 06/27/22 1233 10     Pain Loc --      Pain Edu? --      Excl. in GC? --    No data found.  Updated Vital Signs BP 133/87 (BP Location: Right Arm)   Pulse 85   Temp 98.1 F (36.7 C) (Oral)   Resp 18   SpO2 97%   Visual Acuity Right Eye Distance:   Left Eye Distance:   Bilateral Distance:    Right Eye Near:   Left Eye Near:     Bilateral Near:     Physical Exam Vitals and nursing note reviewed.  Constitutional:      General: He is not in acute distress.    Appearance: Normal appearance. He is not ill-appearing or toxic-appearing.  HENT:     Head: Normocephalic and atraumatic.     Right Ear: Tympanic membrane, ear canal and external ear normal. No drainage, swelling or tenderness. No middle ear effusion. Tympanic membrane is not erythematous.     Left Ear: Tympanic membrane, ear canal and external ear normal. No drainage, swelling or tenderness.  No middle ear effusion. Tympanic membrane is not erythematous.     Nose: Congestion and rhinorrhea present.     Mouth/Throat:     Mouth: Mucous membranes are moist.     Pharynx: Oropharynx is clear. Posterior oropharyngeal erythema present. No oropharyngeal exudate.     Tonsils: No tonsillar exudate. 0 on the right. 0 on the left.     Comments: Petechiae of posterior palate Eyes:     General: No scleral icterus.    Extraocular Movements: Extraocular movements intact.  Cardiovascular:     Rate and Rhythm: Normal rate and regular rhythm.  Pulmonary:     Effort: Pulmonary effort is normal. No respiratory distress.     Breath sounds: Normal breath sounds. No wheezing, rhonchi or rales.  Abdominal:     General: Abdomen is flat. Bowel sounds are normal. There is no distension.     Palpations: Abdomen is soft.  Musculoskeletal:     Cervical back: Normal range of motion and neck supple.  Lymphadenopathy:     Cervical: No cervical adenopathy.  Skin:    General: Skin is warm and dry.     Coloration: Skin is not jaundiced or pale.     Findings: No erythema or rash.  Neurological:     Mental Status: He is alert and oriented to person, place, and time.     Motor: No weakness.  Psychiatric:        Behavior: Behavior is cooperative.      UC Treatments / Results  Labs (all labs ordered are listed, but only abnormal results are displayed) Labs Reviewed  CULTURE,  GROUP A STREP ([facility])  RESP PANEL BY RT-PCR (FLU A&B, COVID) [id number]  POCT RAPID STREP A (OFFICE)    EKG   Radiology No results found.  Procedures Procedures (including critical care time)  Medications Ordered in UC Medications - No data  to display  Initial Impression / Assessment and Plan / UC Course  I have reviewed the triage vital signs and the nursing notes.  Pertinent labs & imaging results that were available during my care of the patient were reviewed by me and considered in my medical decision making (see chart for details).   Patient is well-appearing, normotensive, afebrile, not tachycardic, not tachypneic, oxygenating well on room air.    Encounter for screening for COVID-19 Viral URI with cough Acute pharyngitis, unspecified etiology Rapid strep throat test negative, throat culture pending Suspect viral etiology COVID-19, influenza testing obtained Recommended wearing mask when around others Other supportive care discussed ER and return precautions discussed  The patient was given the opportunity to ask questions.  All questions answered to their satisfaction.  The patient is in agreement to this plan.    Final Clinical Impressions(s) / UC Diagnoses   Final diagnoses:  Encounter for screening for COVID-19  Viral URI with cough  Acute pharyngitis, unspecified etiology     Discharge Instructions      You have a viral upper respiratory infection.  Symptoms should improve over the next week to 10 days.  If you develop chest pain or shortness of breath, go to the emergency room.  We have tested you today for COVID-19 and influenza.  You will see the results in Mychart and we will call you with positive results.    Please stay home and isolate until you are aware of the results.    Some things that can make you feel better are: - Increased rest - Increasing fluid with water/sugar free electrolytes - Acetaminophen and ibuprofen as needed for fever/pain -  Salt water gargling, chloraseptic spray and throat lozenges for sore throat - OTC guaifenesin (Mucinex) 600 mg twice daily for congestion - Saline sinus flushes or a neti pot for nasal congestion - Humidifying the air     ED Prescriptions     Medication Sig Dispense Auth. Provider   guaiFENesin (MUCINEX) 600 MG 12 hr tablet Take 1 tablet (600 mg total) by mouth 2 (two) times daily as needed for cough or to loosen phlegm. 30 tablet Cathlean Marseilles A, NP   sodium chloride (OCEAN) 0.65 % SOLN nasal spray Place 1 spray into both nostrils as needed for congestion. 88 mL Valentino Nose, NP      PDMP not reviewed this encounter.   Valentino Nose, NP 06/27/22 [phone]

## 2022-06-27 NOTE — Discharge Instructions (Addendum)
You have a viral upper respiratory infection.  Symptoms should improve over the next week to 10 days.  If you develop chest pain or shortness of breath, go to the emergency room.  We have tested you today for COVID-19 and influenza.  You will see the results in Mychart and we will call you with positive results.    Please stay home and isolate until you are aware of the results.    Some things that can make you feel better are: - Increased rest - Increasing fluid with water/sugar free electrolytes - Acetaminophen and ibuprofen as needed for fever/pain - Salt water gargling, chloraseptic spray and throat lozenges for sore throat - OTC guaifenesin (Mucinex) 600 mg twice daily for congestion - Saline sinus flushes or a neti pot for nasal congestion - Humidifying the air

## 2022-06-27 NOTE — ED Triage Notes (Signed)
Pt reports runny nose and sore throat x 1 day.

## 2022-06-30 LAB — CULTURE, GROUP A STREP (THRC)

## 2022-08-09 ENCOUNTER — Ambulatory Visit (INDEPENDENT_AMBULATORY_CARE_PROVIDER_SITE_OTHER): Payer: Medicaid Other | Admitting: Family Medicine

## 2022-08-09 VITALS — BP 129/81 | Ht 65.0 in | Wt 258.8 lb

## 2022-08-09 DIAGNOSIS — M79604 Pain in right leg: Secondary | ICD-10-CM

## 2022-08-09 DIAGNOSIS — F439 Reaction to severe stress, unspecified: Secondary | ICD-10-CM

## 2022-08-09 DIAGNOSIS — Z1159 Encounter for screening for other viral diseases: Secondary | ICD-10-CM | POA: Diagnosis not present

## 2022-08-09 DIAGNOSIS — R635 Abnormal weight gain: Secondary | ICD-10-CM | POA: Diagnosis not present

## 2022-08-09 DIAGNOSIS — Z13220 Encounter for screening for lipoid disorders: Secondary | ICD-10-CM

## 2022-08-09 DIAGNOSIS — R739 Hyperglycemia, unspecified: Secondary | ICD-10-CM

## 2022-08-09 DIAGNOSIS — Z0000 Encounter for general adult medical examination without abnormal findings: Secondary | ICD-10-CM

## 2022-08-09 DIAGNOSIS — F630 Pathological gambling: Secondary | ICD-10-CM

## 2022-08-09 DIAGNOSIS — Z0001 Encounter for general adult medical examination with abnormal findings: Secondary | ICD-10-CM

## 2022-08-09 DIAGNOSIS — Z114 Encounter for screening for human immunodeficiency virus [HIV]: Secondary | ICD-10-CM | POA: Diagnosis not present

## 2022-08-09 DIAGNOSIS — E119 Type 2 diabetes mellitus without complications: Secondary | ICD-10-CM

## 2022-08-09 NOTE — Progress Notes (Signed)
Subjective:    Patient ID: Elijah [name], male    DOB: 06-02-1996, 27 y.o.   MRN: [id number]  HPI The patient comes in today for a wellness visit.    A review of their health history was completed.  A review of medications was also completed.  Any needed refills; NO  Eating habits: eating good  Falls/  MVA accidents in past few months: none  Regular exercise: not really  Specialist pt sees on regular basis: no  Preventative health issues were discussed.   Additional concerns: Right leg still bothering him                                  Continued rash on arm                                  Mother would like him to have blood work to test for diabetes    Review of Systems     Objective:   Physical Exam General-in no acute distress Eyes-no discharge Lungs-respiratory rate normal, CTA CV-no murmurs,RRR Extremities skin warm dry no edema Neuro grossly normal Behavior normal, alert Morbid obesity Ankle normal range of motion knee no laxity in the ligaments noted subjective discomfort in the right knee       Assessment & Plan:  1. Well adult exam Adult wellness-complete.wellness physical was conducted today. Importance of diet and exercise were discussed in detail.  Importance of stress reduction and healthy living were discussed.  In addition to this a discussion regarding safety was also covered.  We also reviewed over immunizations and gave recommendations regarding current immunization needed for age.   In addition to this additional areas were also touched on including: Preventative health exams needed:  Colonoscopy not indicated currently  Patient was advised yearly wellness exam   2. Screening for HIV (human immunodeficiency virus) Screening today - HIV Antibody (routine testing w rflx)  3. Encounter for hepatitis C screening test for low risk patient Screening I - Hepatitis C Antibody  4. Weight gain Significant weight gain very  important for him to minimize calories avoid sugary drinks fit in walking on a regular basis.  Patient is disabled has mental health issues including anxiety with mild depression as well as recent gambling issues.  I have encouraged him to seek counseling for all of this we will look into setting him up for a counselor - TSH  5. Hyperglycemia Will check A1c to make sure he is not developing diabetes because of significant weight gain - Hepatic function panel - Basic metabolic panel - Hemoglobin A1c  6. Right leg pain Referral to orthopedic surgery I think this is more contusion and sprain but he states that his knee intermittently causes severe pain since his right ankle twisting.  Truly want to avoid any pain medicines for this patient or any long-term chronic medications if possible - Ambulatory referral to Orthopedic Surgery  7. Gambling disorder, mild Counseling would be beneficial.  Patient to try to be a better manager of his finances  8. Morbid obesity ([place]) Portion control cutting back on starches and sugars fitting and walking on a regular basis check lab work - Hepatic function panel - TSH  9. Screening, lipid Check lab work - Lipid panel  Patient denies being depressed but he is stressed.  A lot of times  he will gamble or play lottery to help him with his stress.  He would benefit from some counseling.  He was encouraged to stay away from gambling

## 2022-08-09 NOTE — Patient Instructions (Addendum)
Hi Elijah [name] It was good to see you today. Overall your health checkup went well It is important for you to continue to try to make good choices with eating and liquids to minimize excessive sugars and starches. I have printed some additional papers for you to read over that have tips on healthy eating and activity to help you to lose weight Please do your blood work in the near future-we are checking multiple things including sugar, kidneys, thyroid, cholesterol Also do the best he can to stay away from spending your money on gambling or lottery If you feel you need a counselor regarding this please let me know and we can help set this up. Also we will get you an appointment with Dr. Ruthe Mannan office for orthopedics for them to check your knee.  On exam today I think more likely you have just sprained that side and I do not see any evidence of a fracture or need for surgery. Please follow-up in 6 months to see how well you are doing with weight reduction. Let us know sooner if any problems.  Take care-Dr. Sallee Lange

## 2022-08-10 ENCOUNTER — Encounter: Payer: Self-pay | Admitting: Orthopedic Surgery

## 2022-08-10 LAB — BASIC METABOLIC PANEL
BUN/Creatinine Ratio: 14 (ref 9–20)
BUN: 11 mg/dL (ref 6–20)
CO2: 22 mmol/L (ref 20–29)
Calcium: 10 mg/dL (ref 8.7–10.2)
Chloride: 98 mmol/L (ref 96–106)
Creatinine, Ser: 0.79 mg/dL (ref 0.76–1.27)
Glucose: 181 mg/dL — ABNORMAL HIGH (ref 70–99)
Potassium: 4.3 mmol/L (ref 3.5–5.2)
Sodium: 137 mmol/L (ref 134–144)
eGFR: 126 mL/min/{1.73_m2} (ref >59)

## 2022-08-10 LAB — LIPID PANEL
Chol/HDL Ratio: 5.1 ratio — ABNORMAL HIGH (ref 0.0–5.0)
Cholesterol, Total: 234 mg/dL — ABNORMAL HIGH (ref 100–199)
HDL: 46 mg/dL (ref >39)
LDL Chol Calc (NIH): 156 mg/dL — ABNORMAL HIGH (ref 0–99)
Triglycerides: 176 mg/dL — ABNORMAL HIGH (ref 0–149)
VLDL Cholesterol Cal: 32 mg/dL (ref 5–40)

## 2022-08-10 LAB — HEMOGLOBIN A1C
Est. average glucose Bld gHb Est-mCnc: 169 mg/dL
Hgb A1c MFr Bld: 7.5 % — ABNORMAL HIGH (ref 4.8–5.6)

## 2022-08-10 LAB — TSH: TSH: 1.04 u[IU]/mL (ref 0.450–4.500)

## 2022-08-10 LAB — HEPATIC FUNCTION PANEL
ALT: 17 IU/L (ref 0–44)
AST: 13 IU/L (ref 0–40)
Albumin: 4.8 g/dL (ref 4.3–5.2)
Alkaline Phosphatase: 40 IU/L — ABNORMAL LOW (ref 44–121)
Bilirubin Total: 0.2 mg/dL (ref 0.0–1.2)
Bilirubin, Direct: <0.1 mg/dL (ref 0.00–0.40)
Total Protein: 7.6 g/dL (ref 6.0–8.5)

## 2022-08-10 LAB — HIV ANTIBODY (ROUTINE TESTING W REFLEX): HIV Screen 4th Generation wRfx: NONREACTIVE

## 2022-08-10 LAB — HEPATITIS C ANTIBODY: Hep C Virus Ab: NONREACTIVE

## 2022-08-10 NOTE — Progress Notes (Signed)
08/10/22-referral placed

## 2022-08-10 NOTE — Addendum Note (Signed)
Addended by: Vicente Males on: 08/10/2022 11:14 AM   Modules accepted: Orders

## 2022-08-11 ENCOUNTER — Encounter: Payer: Self-pay | Admitting: Family Medicine

## 2022-08-11 MED ORDER — METFORMIN HCL 500 MG PO TABS: 500.0000 mg | ORAL_TABLET | Freq: Every day | ORAL | 2 refills | Status: DC

## 2022-08-11 MED ORDER — METFORMIN HCL 500 MG PO TABS: 500.0000 mg | ORAL_TABLET | Freq: Two times a day (BID) | ORAL | 2 refills | Status: DC

## 2022-08-11 NOTE — Addendum Note (Signed)
Addended by: Dairl Ponder on: 08/11/2022 03:22 PM   Modules accepted: Orders

## 2022-08-11 NOTE — Addendum Note (Signed)
Addended by: Dairl Ponder on: 08/11/2022 03:23 PM   Modules accepted: Orders

## 2022-08-30 ENCOUNTER — Ambulatory Visit (INDEPENDENT_AMBULATORY_CARE_PROVIDER_SITE_OTHER): Payer: Medicaid Other | Admitting: Family Medicine

## 2022-08-30 VITALS — BP 104/68 | Wt 258.8 lb

## 2022-08-30 DIAGNOSIS — E785 Hyperlipidemia, unspecified: Secondary | ICD-10-CM

## 2022-08-30 DIAGNOSIS — J029 Acute pharyngitis, unspecified: Secondary | ICD-10-CM

## 2022-08-30 DIAGNOSIS — E119 Type 2 diabetes mellitus without complications: Secondary | ICD-10-CM | POA: Diagnosis not present

## 2022-08-30 NOTE — Patient Instructions (Signed)
[facility] @ [place] [phone]

## 2022-08-30 NOTE — Progress Notes (Signed)
   Subjective:    Patient ID: Elijah [name], male    DOB: [birth year], 27 y.o.   MRN: [id number]  Diabetes He has type 2 diabetes mellitus. Hypoglycemia symptoms include headaches. There are no diabetic associated symptoms. There are no hypoglycemic complications. Current diabetic treatment includes oral agent (monotherapy).  Patient with significant obesity His diet before most recent diagnosis was very poor He is now trying to do better He does have some intellectual disabilities His mother helps him He does not like to do much exercise.  Family working with him currently to become more active    Review of Systems  Neurological:  Positive for headaches.  Relates head congestion drainage over the past week and a half     Objective:   Physical Exam General-in no acute distress Eyes-no discharge Lungs-respiratory rate normal, CTA CV-no murmurs,RRR Extremities skin warm dry no edema Neuro grossly normal Behavior normal, alert  Mother was present for this visit      Assessment & Plan:  1. New onset type 2 diabetes mellitus ([place]) Portion control regular physical activity healthy eating.  Referral for education purposes lab work before follow-up visit in 3 months metformin 500 mg daily for side effect issues notify us we can go to extended release - Microalbumin / creatinine urine ratio - Hemoglobin A1c  2. Hyperlipidemia, unspecified hyperlipidemia type Relook at this in the follow-up may end up needing to have statin - Basic Metabolic Panel (7) - Lipid panel  3. Morbid obesity ([place]) Meet with diabetic educator, portion control, work toward fitting and walking on a regular basis work toward 20 minutes walking 4 times a week then gradually work way up to 30 minutes 4 times a week  4. Sore throat Viral no antibiotics indicated currently  Flu shot through pharmacy Follow-up 3 months

## 2022-09-22 ENCOUNTER — Encounter: Payer: Self-pay | Admitting: Radiology

## 2022-09-29 ENCOUNTER — Encounter: Payer: Self-pay | Admitting: Nutrition

## 2022-09-29 ENCOUNTER — Encounter: Payer: Medicaid Other | Attending: Family Medicine | Admitting: Nutrition

## 2022-09-29 ENCOUNTER — Telehealth: Payer: Self-pay | Admitting: Family Medicine

## 2022-09-29 VITALS — Ht 65.0 in | Wt 250.0 lb

## 2022-09-29 DIAGNOSIS — Z713 Dietary counseling and surveillance: Secondary | ICD-10-CM | POA: Diagnosis not present

## 2022-09-29 DIAGNOSIS — Z794 Long term (current) use of insulin: Secondary | ICD-10-CM | POA: Diagnosis not present

## 2022-09-29 DIAGNOSIS — E119 Type 2 diabetes mellitus without complications: Secondary | ICD-10-CM | POA: Insufficient documentation

## 2022-09-29 DIAGNOSIS — E782 Mixed hyperlipidemia: Secondary | ICD-10-CM

## 2022-09-29 DIAGNOSIS — E118 Type 2 diabetes mellitus with unspecified complications: Secondary | ICD-10-CM

## 2022-09-29 NOTE — Patient Instructions (Signed)
Goals  Follow the PLATE Method Eat smaller portions Increase fruits and vegetables Stop sugar free packets Cut out beef, junk food, sodas, fast foods Drink 6 bottles of water per day Avoid keto foods/products Walk a mile every day. Lose 1 lb per week

## 2022-09-29 NOTE — Progress Notes (Signed)
Medical Nutrition Therapy  Appointment Start time:  0800  Appointment End time:  0900  Primary concerns today: Dm Type 2  Referral diagnosis: E11.9 Preferred learning style: Viscual and hands on. Learning readiness: Ready    NUTRITION ASSESSMENT  27 yr old wmale here for newly diagnosed Type 2 DM, Obesity and Hyperlipidemia. PCP Dr. Sallee Lange. Started on 500 mg a day of Metformin. Not testing blood sugars at this time.  Since seeing PCP, he has cut out sweets, sodas  and a lot of junk food since dx. He has been walking a little. Donates plasma twice a week. He notes his mom does a lot of keto practices. She has had a gastric sleeve done. Discussed the dangers of Keto for him and didn't recommend he follow a keto diet or lifestyle habits.   Encouraged more whole foods, plant based and focusing on eating foods out of a garden. He and his family have grown a garden in past and he looks forward to doing it again this year. He enjoys eating fruits, vegetables, chicken and fish. He cooks foods in a air fryer or in the oven.  Diet is high in fast and processed foods contributing to his new diagnosed diabetes and hyperlipidemia. He is willing to work on making lifestyle changes with his food choices and activity to reduce weight, reverse his DM and Hyperlipemia. He is positive about the changes he can make and motivated to get healthier.   Anthropometrics  Wt Readings from Last 3 Encounters:  09/29/22 250 lb (113.4 kg)  08/30/22 258 lb 12.8 oz (117.4 kg)  08/09/22 258 lb 12.8 oz (117.4 kg)   Ht Readings from Last 3 Encounters:  09/29/22 '5\' 5"'$  (1.651 m)  08/09/22 '5\' 5"'$  (1.651 m)  06/14/22 '5\' 5"'$  (1.651 m)   Body mass index is 41.6 kg/m. '@BMIFA'$ @ Facility age limit for growth %iles is 20 years. Facility age limit for growth %iles is 20 years.    Clinical Medical Hx: Dm Type 2, Obesity and Hyperlipidemia. Medications: Metformin 500 mg a day.  Labs:  Lab Results  Component Value Date    HGBA1C 7.5 (H) 08/09/2022      Latest Ref Rng & Units 08/09/2022   10:03 AM 01/09/2019    5:55 PM 07/30/2017    2:28 AM  CMP  Glucose 70 - 99 mg/dL 181  86  113   BUN 6 - 20 mg/dL '11  9  11   '$ Creatinine 0.76 - 1.27 mg/dL 0.79  0.69  0.76   Sodium 134 - 144 mmol/L 137  140  139   Potassium 3.5 - 5.2 mmol/L 4.3  3.8  2.9   Chloride 96 - 106 mmol/L 98  104  103   CO2 20 - 29 mmol/L '22  28  23   '$ Calcium 8.7 - 10.2 mg/dL 10.0  9.5  9.1   Total Protein 6.0 - 8.5 g/dL 7.6     Total Bilirubin 0.0 - 1.2 mg/dL 0.2     Alkaline Phos 44 - 121 IU/L 40     AST 0 - 40 IU/L 13     ALT 0 - 44 IU/L 17      Lipid Panel     Component Value Date/Time   CHOL 234 (H) 08/09/2022 1003   TRIG 176 (H) 08/09/2022 1003   HDL 46 08/09/2022 1003   CHOLHDL 5.1 (H) 08/09/2022 1003   CHOLHDL 5.3 03/10/2017 0643   VLDL 38 03/10/2017 0643   LDLCALC  156 (H) 08/09/2022 1003   LABVLDL 32 08/09/2022 1003    Notable Signs/Symptoms: None  Lifestyle & Dietary Hx Lives with his mom. Just dx a month ago.   Estimated daily fluid intake: 40 oz Supplements:  Sleep: 8 Stress / self-care: none Current average weekly physical activity: Just started walking some  24-Hr Dietary Recall First Meal: Egg or mcdonalds sausage gravy biscuit Snack:  Second Meal: skipped yesterday; or sandwich Snack:  Third Meal: CHicken alfredo, water Snack: fruit popcycle Beverages: water, sugar   Estimated Energy Needs Calories: 1500 Carbohydrate: 170g Protein: 112g Fat: 42g   NUTRITION DIAGNOSIS  NB-1.1 Food and nutrition-related knowledge deficit As related to high fat, high sugar processed diet.  As evidenced by A1C 7.5%, Hyperlipidemia and Obesity.   NUTRITION INTERVENTION  Nutrition education (E-1) on the following topics:  Nutrition and Diabetes education provided on My Plate, CHO counting, meal planning, portion sizes, timing of meals, avoiding snacks between meals unless having a low blood sugar, target ranges for  A1C and blood sugars, signs/symptoms and treatment of hyper/hypoglycemia, monitoring blood sugars, taking medications as prescribed, benefits of exercising 30 minutes per day and prevention of complications of DM.  Lifestyle Medicine  - Whole Food, Plant Predominant Nutrition is highly recommended: Eat Plenty of vegetables, Mushrooms, fruits, Legumes, Whole Grains, Nuts, seeds in lieu of processed meats, processed snacks/pastries red meat, poultry, eggs.    -It is better to avoid simple carbohydrates including: Cakes, Sweet Desserts, Ice Cream, Soda (diet and regular), Sweet Tea, Candies, Chips, Cookies, Store Bought Juices, Alcohol in Excess of  1-2 drinks a day, Lemonade,  Artificial Sweeteners, Doughnuts, Coffee Creamers, "Sugar-free" Products, etc, etc.  This is not a complete list.....  Exercise: If you are able: 30 -60 minutes a day ,4 days a week, or 150 minutes a week.  The longer the better.  Combine stretch, strength, and aerobic activities.  If you were told in the past that you have high risk for cardiovascular diseases, you may seek evaluation by your heart doctor prior to initiating moderate to intense exercise programs.   Handouts Provided Include  My Plate Calorie density of foods   Learning Style & Readiness for Change Teaching method utilized: Visual & Auditory  Demonstrated degree of understanding via: Teach Back  Barriers to learning/adherence to lifestyle change: none  Goals Established by Pt Goals  Follow the PLATE Method Eat smaller portions Increase fruits and vegetables Stop sugar free packets Cut out beef, junk food, sodas, fast foods Drink 6 bottles of water per day Avoid keto foods/products Walk a mile every day. Lose 1 lb per week   MONITORING & EVALUATION Dietary intake, weekly physical activity, and weight in 1 month.  Next Steps  Patient is to work on eating more foods that grow in a garden of friuts, vegetables and cutting out fast and processed  foods.Marland Kitchen

## 2022-09-29 NOTE — Telephone Encounter (Signed)
It should be noted that we did try to refer him to orthopedics please see the message below thank you  Lovena Le, Tomma Lightning, Leota Sauers, Elayne Snare, MD Thank you for trusting the care of your patient to our office. However we have made three unsuccessful attempts to contact the patient, to schedule an appointment. At this time we will close the referral, if an appointment is still needed please have the patient contact our office at [phone].

## 2022-10-30 ENCOUNTER — Other Ambulatory Visit: Payer: Self-pay | Admitting: Family Medicine

## 2022-11-09 ENCOUNTER — Ambulatory Visit: Payer: Medicaid Other | Admitting: Nutrition

## 2022-11-10 LAB — HEMOGLOBIN A1C
Est. average glucose Bld gHb Est-mCnc: 137 mg/dL
Hgb A1c MFr Bld: 6.4 % — ABNORMAL HIGH (ref 4.8–5.6)

## 2022-11-10 LAB — BASIC METABOLIC PANEL (7)
BUN/Creatinine Ratio: 15 (ref 9–20)
BUN: 12 mg/dL (ref 6–20)
CO2: 22 mmol/L (ref 20–29)
Chloride: 102 mmol/L (ref 96–106)
Creatinine, Ser: 0.82 mg/dL (ref 0.76–1.27)
Glucose: 96 mg/dL (ref 70–99)
Potassium: 4.5 mmol/L (ref 3.5–5.2)
Sodium: 139 mmol/L (ref 134–144)
eGFR: 124 mL/min/{1.73_m2} (ref >59)

## 2022-11-10 LAB — LIPID PANEL
Chol/HDL Ratio: 5.8 ratio — ABNORMAL HIGH (ref 0.0–5.0)
Cholesterol, Total: 219 mg/dL — ABNORMAL HIGH (ref 100–199)
HDL: 38 mg/dL — ABNORMAL LOW (ref >39)
LDL Chol Calc (NIH): 161 mg/dL — ABNORMAL HIGH (ref 0–99)
Triglycerides: 108 mg/dL (ref 0–149)
VLDL Cholesterol Cal: 20 mg/dL (ref 5–40)

## 2022-11-10 LAB — MICROALBUMIN / CREATININE URINE RATIO
Creatinine, Urine: 239.7 mg/dL
Microalb/Creat Ratio: 3 mg/g creat (ref 0–29)
Microalbumin, Urine: 6.7 ug/mL

## 2022-11-22 ENCOUNTER — Ambulatory Visit (INDEPENDENT_AMBULATORY_CARE_PROVIDER_SITE_OTHER): Payer: Medicaid Other | Admitting: Family Medicine

## 2022-11-22 VITALS — BP 129/64 | HR 92 | Ht 65.0 in | Wt 238.4 lb

## 2022-11-22 DIAGNOSIS — E1169 Type 2 diabetes mellitus with other specified complication: Secondary | ICD-10-CM | POA: Diagnosis not present

## 2022-11-22 DIAGNOSIS — Z7984 Long term (current) use of oral hypoglycemic drugs: Secondary | ICD-10-CM | POA: Diagnosis not present

## 2022-11-22 DIAGNOSIS — E119 Type 2 diabetes mellitus without complications: Secondary | ICD-10-CM

## 2022-11-22 DIAGNOSIS — E785 Hyperlipidemia, unspecified: Secondary | ICD-10-CM | POA: Diagnosis not present

## 2022-11-22 MED ORDER — METFORMIN HCL 500 MG PO TABS: ORAL_TABLET | ORAL | 6 refills | Status: DC

## 2022-11-22 NOTE — Progress Notes (Unsigned)
Subjective:    Patient ID: Elijah [name], male    DOB: 1995-11-15, 27 y.o.   MRN: [id number]  HPI Patient arrives today for 12 week follow up and lab work.   Outpatient Encounter Medications as of 11/22/2022  Medication Sig   albuterol (VENTOLIN HFA) 108 (90 Base) MCG/ACT inhaler Inhale 2 puffs into the lungs every 4 (four) hours as needed for wheezing or shortness of breath.   sodium chloride (OCEAN) 0.65 % SOLN nasal spray Place 1 spray into both nostrils as needed for congestion.   [DISCONTINUED] metFORMIN (GLUCOPHAGE) 500 MG tablet TAKE ONE TABLET BY MOUTH EVERY MORNING WITH FOOD   metFORMIN (GLUCOPHAGE) 500 MG tablet TAKE ONE TABLET BY MOUTH EVERY MORNING WITH FOOD   No facility-administered encounter medications on file as of 11/22/2022.    Results for orders placed or performed in visit on 08/30/22  Microalbumin / creatinine urine ratio  Result Value Ref Range   Creatinine, Urine 239.7 Not Estab. mg/dL   Microalbumin, Urine 6.7 Not Estab. ug/mL   Microalb/Creat Ratio 3 0 - 29 mg/g creat  Hemoglobin A1c  Result Value Ref Range   Hgb A1c MFr Bld 6.4 (H) 4.8 - 5.6 %   Est. average glucose Bld gHb Est-mCnc 137 mg/dL  Basic Metabolic Panel (7)  Result Value Ref Range   Glucose 96 70 - 99 mg/dL   BUN 12 6 - 20 mg/dL   Creatinine, Ser 4.09 0.76 - 1.27 mg/dL   eGFR 811 >91 YN/WGN/5.62   BUN/Creatinine Ratio 15 9 - 20   Sodium 139 134 - 144 mmol/L   Potassium 4.5 3.5 - 5.2 mmol/L   Chloride 102 96 - 106 mmol/L   CO2 22 20 - 29 mmol/L  Lipid panel  Result Value Ref Range   Cholesterol, Total 219 (H) 100 - 199 mg/dL   Triglycerides 130 0 - 149 mg/dL   HDL 38 (L) >86 mg/dL   VLDL Cholesterol Cal 20 5 - 40 mg/dL   LDL Chol Calc (NIH) 578 (H) 0 - 99 mg/dL   Chol/HDL Ratio 5.8 (H) 0.0 - 5.0 ratio    Hyperlipidemia associated with type 2 diabetes mellitus (HCC) - Plan: Lipid panel  Diabetes mellitus without complication (HCC) - Plan: Hemoglobin A1c, Basic Metabolic Panel  (7)  Young man recently diagnosed with diabetes He is doing a much better job of eating healthier Trying to be more active walking Has lost 20 pounds Review of Systems     Objective:   Physical Exam   General-in no acute distress Eyes-no discharge Lungs-respiratory rate normal, CTA CV-no murmurs,RRR Extremities skin warm dry no edema Neuro grossly normal Behavior normal, alert  He relates his moods are doing better He does have a tendency to play a lot of video games on his cell phone we did caution him regarding losing money through gambling or such what he seems to understand regarding this-he has had problems with this area before     Assessment & Plan:   1. Hyperlipidemia associated with type 2 diabetes mellitus (HCC) He is working on diet Follow-up labs in the fall Eventually will need a statin but for right now working dietary - Lipid panel  2. Diabetes mellitus without complication (HCC) Doing well on the metformin continue current measures.  Doing well with losing weight - Hemoglobin A1c - Basic Metabolic Panel (7)  3. Morbid obesity (HCC) Has lost 20 pounds Portion control regular physical activity  Follow-up later in the fall

## 2022-11-22 NOTE — Patient Instructions (Addendum)
Weight 238 today Was 258  Continue metformin one a day A1C now 6.4  We will see you end of Sept or start of October and to do labs before that visit   Please keep up the good work!!!

## 2022-11-28 ENCOUNTER — Encounter: Payer: Self-pay | Admitting: Nutrition

## 2022-11-28 ENCOUNTER — Encounter: Payer: Medicaid Other | Attending: Family Medicine | Admitting: Nutrition

## 2022-11-28 VITALS — Ht 65.0 in | Wt 244.0 lb

## 2022-11-28 DIAGNOSIS — E118 Type 2 diabetes mellitus with unspecified complications: Secondary | ICD-10-CM | POA: Diagnosis present

## 2022-11-28 DIAGNOSIS — E782 Mixed hyperlipidemia: Secondary | ICD-10-CM | POA: Diagnosis present

## 2022-11-28 NOTE — Progress Notes (Signed)
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 Learning readiness: Ready    NUTRITION ASSESSMENT   DM Follow up A1C down to 6.4% from 7.5%. Saw Dr. Gerda Diss last week. Drinking 8 bottles of water per day Eating the  Healthy Choice power bowl meals. Feel better. , clothes fitting better. Cut out a lot of junk food and processed foods. Still eating some but working on cutting it out. Cholesterol, weight and BS have all improved with making lifestyle medicine changes. Still taking Metformin 500 mg once a day. Wants to improve his cholesterol with diet and lifestyle changes instead of medications. Making good  lifelong progress.  He has lost 6 lbs since last visit with me. Had lost  20 lbs since seeing Dr. Gerda Diss but gained back a few pounds in the last few weeks but slipping in some unhealthy foods of bacon, bologna at times.  Anthropometrics  Wt Readings from Last 3 Encounters:  11/22/22 238 lb 6.4 oz (108.1 kg)  09/29/22 250 lb (113.4 kg)  08/30/22 258 lb 12.8 oz (117.4 kg)   Ht Readings from Last 3 Encounters:  11/22/22 5\' 5"  (1.651 m)  09/29/22 5\' 5"  (1.651 m)  08/09/22 5\' 5"  (1.651 m)   There is no height or weight on file to calculate BMI. @BMIFA @ Facility age limit for growth %iles is 20 years. Facility age limit for growth %iles is 20 years.    Clinical Medical Hx: Dm Type 2, Obesity and Hyperlipidemia. Medications: Metformin 500 mg a day.  Labs:  Lab Results  Component Value Date   HGBA1C 6.4 (H) 11/08/2022      Latest Ref Rng & Units 11/08/2022    8:14 AM 08/09/2022   10:03 AM 01/09/2019    5:55 PM  CMP  Glucose 70 - 99 mg/dL 96  578  86   BUN 6 - 20 mg/dL 12  11  9    Creatinine 0.76 - 1.27 mg/dL 4.69  6.29  5.28   Sodium 134 - 144 mmol/L 139  137  140   Potassium 3.5 - 5.2 mmol/L 4.5  4.3  3.8   Chloride 96 -  106 mmol/L 102  98  104   CO2 20 - 29 mmol/L 22  22  28    Calcium 8.7 - 10.2 mg/dL  41.3  9.5   Total Protein 6.0 - 8.5 g/dL  7.6    Total Bilirubin 0.0 - 1.2 mg/dL  0.2    Alkaline Phos 44 - 121 IU/L  40    AST 0 - 40 IU/L  13    ALT 0 - 44 IU/L  17     Lipid Panel     Component Value Date/Time   CHOL 219 (H) 11/08/2022 0814   TRIG 108 11/08/2022 0814   HDL 38 (L) 11/08/2022 0814   CHOLHDL 5.8 (H) 11/08/2022 0814   CHOLHDL 5.3 03/10/2017 0643   VLDL 38 03/10/2017 0643   LDLCALC 161 (H) 11/08/2022 0814   LABVLDL 20 11/08/2022 0814    Notable Signs/Symptoms: None  Lifestyle & Dietary Hx Lives with his mom. Just dx a month ago.   Estimated daily fluid intake: 40 oz Supplements:  Sleep: 8 Stress / self-care: none Current average weekly physical activity: Just started walking some  24-Hr Dietary Recall First Meal: Eggs, bacon, grits, water Lunch:  skipped , water  Dinner: steak, bread and green beans,  Water   Estimated Energy Needs Calories: 1500 Carbohydrate: 170g Protein: 112g Fat: 42g   NUTRITION DIAGNOSIS  NB-1.1 Food and nutrition-related knowledge deficit As related to high fat, high sugar processed diet.  As evidenced by A1C 7.5%, Hyperlipidemia and Obesity.   NUTRITION INTERVENTION  Nutrition education (E-1) on the following topics:  Nutrition and Diabetes education provided on My Plate, CHO counting, meal planning, portion sizes, timing of meals, avoiding snacks between meals unless having a low blood sugar, target ranges for A1C and blood sugars, signs/symptoms and treatment of hyper/hypoglycemia, monitoring blood sugars, taking medications as prescribed, benefits of exercising 30 minutes per day and prevention of complications of DM.  Lifestyle Medicine  - Whole Food, Plant Predominant Nutrition is highly recommended: Eat Plenty of vegetables, Mushrooms, fruits, Legumes, Whole Grains, Nuts, seeds in lieu of processed meats, processed snacks/pastries  red meat, poultry, eggs.    -It is better to avoid simple carbohydrates including: Cakes, Sweet Desserts, Ice Cream, Soda (diet and regular), Sweet Tea, Candies, Chips, Cookies, Store Bought Juices, Alcohol in Excess of  1-2 drinks a day, Lemonade,  Artificial Sweeteners, Doughnuts, Coffee Creamers, "Sugar-free" Products, etc, etc.  This is not a complete list.....  Exercise: If you are able: 30 -60 minutes a day ,4 days a week, or 150 minutes a week.  The longer the better.  Combine stretch, strength, and aerobic activities.  If you were told in the past that you have high risk for cardiovascular diseases, you may seek evaluation by your heart doctor prior to initiating moderate to intense exercise programs.   Handouts Provided Include  My Plate Calorie density of foods   Learning Style & Readiness for Change Teaching method utilized: Visual & Auditory  Demonstrated degree of understanding via: Teach Back  Barriers to learning/adherence to lifestyle change: none  Goals Established by Pt Goals  Cut out processed foods of bacon and bologna Increase fruits and vegetables- get 3 a day. Keep drinking water  Walk for 20 minutes a day  Lose 1 lb per week   MONITORING & EVALUATION Dietary intake, weekly physical activity, and weight in 3 month.  Next Steps  Patient is to work on eating more foods that grow in a garden of friuts, vegetables and cutting out fast and processed foods.Marland Kitchen

## 2022-11-28 NOTE — Patient Instructions (Signed)
Goal  Cut out processed foods of bacon and bologna Increase fruits and vegetables- get 3 a day. Keep drinking water  Walk for 20 minutes a day  Lose 1 lb per week

## 2022-11-30 ENCOUNTER — Encounter (HOSPITAL_COMMUNITY): Payer: Self-pay

## 2022-11-30 ENCOUNTER — Ambulatory Visit (INDEPENDENT_AMBULATORY_CARE_PROVIDER_SITE_OTHER): Payer: No Typology Code available for payment source | Admitting: Clinical

## 2022-11-30 DIAGNOSIS — F840 Autistic disorder: Secondary | ICD-10-CM

## 2022-11-30 DIAGNOSIS — F331 Major depressive disorder, recurrent, moderate: Secondary | ICD-10-CM

## 2022-11-30 DIAGNOSIS — F419 Anxiety disorder, unspecified: Secondary | ICD-10-CM | POA: Diagnosis not present

## 2022-11-30 NOTE — Progress Notes (Signed)
IN PERSON   I connected with Elijah [name] on 11/30/22 at 10:00 AM EDT in person and verified that I am speaking with the correct person using two identifiers.  Location: Patient: Office Provider: Office   I discussed the limitations of evaluation and management by telemedicine and the availability of in person appointments. 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 No data recorded Referral name: No data recorded Referral phone number: No data recorded  Whom do you see for routine medical problems? No data recorded Practice/Facility Name: No data recorded Practice/Facility Phone Number: No data recorded Name of Contact: No data recorded Contact Number: No data recorded Contact Fax Number: No data recorded Prescriber Name: No data recorded Prescriber Address (if known): No data recorded  What Is the Reason for Your Visit/Call Today? No data recorded How Long Has This Been Causing You Problems? No data recorded What Do You Feel Would Help You the Most Today? No data recorded  Have You Recently Been in Any Inpatient Treatment (Hospital/Detox/Crisis Center/28-Day Program)? No data recorded Name/Location of Program/Hospital:No data recorded How Long Were You There? No data recorded When Were You Discharged? No data recorded  Have You Ever Received Services From [facility] Before? No data recorded Who Do You See at [facility]? No data recorded  Have You Recently Had Any Thoughts About Hurting Yourself? No data recorded Are You Planning to Commit Suicide/Harm Yourself At This time? No data recorded  Have you Recently Had Thoughts About  Hurting Someone Karolee Ohs? No data recorded Explanation: No data recorded  Have You Used Any Alcohol or Drugs in the Past 24 Hours? No data recorded How Long Ago Did You Use Drugs or Alcohol? No data recorded What Did You Use and How Much? No data recorded  Do You Currently Have a Therapist/Psychiatrist? No data recorded Name of Therapist/Psychiatrist: No data recorded  Have You Been Recently Discharged From Any Office Practice or Programs? No data recorded Explanation of Discharge From Practice/Program: No data recorded    CCA Screening Triage Referral Assessment Type of Contact: No data recorded Is this Initial or Reassessment? No data recorded Date Telepsych consult ordered in [facility]:  No data recorded Time Telepsych consult ordered in [facility]:  No data recorded  Patient Reported Information Reviewed? No data recorded Patient Left Without Being Seen? No data recorded Reason for Not Completing Assessment: No data recorded  Collateral Involvement: No data recorded  Does Patient Have a Court Appointed Legal Guardian? No data recorded Name and Contact of Legal Guardian: No data recorded If Minor and Not Living with Parent(s), Who has Custody? No data recorded Is CPS involved or ever been involved? No data recorded Is APS involved or ever been involved? No data recorded  Patient Determined To Be At Risk for Harm To Self or Others Based on Review of Patient Reported Information or Presenting Complaint? No data recorded Method: No data recorded Availability of Means: No data recorded Intent: No data recorded Notification Required: No data recorded Additional Information for Danger to Others Potential: No data recorded Additional Comments for Danger to Others Potential: No data recorded Are There Guns or Other Weapons in Your Home? No data  recorded Types of Guns/Weapons: No data recorded Are These Weapons Safely Secured?                            No data recorded Who Could Verify You Are  Able To Have These Secured: No data recorded Do You Have any Outstanding Charges, Pending Court Dates, Parole/Probation? No data recorded Contacted To Inform of Risk of Harm To Self or Others: No data recorded  Location of Assessment: No data recorded  Does Patient Present under Involuntary Commitment? No data recorded IVC Papers Initial File Date: No data recorded  [place] of Residence: No data recorded  Patient Currently Receiving the Following Services: No data recorded  Determination of Need: No data recorded  Options For Referral: No data recorded    CCA Biopsychosocial Intake/Chief Complaint:  The patient was referred by his PCP Eileen Stanford for further treatment MH services for Depression. Patient has pre-existing dx of ASD  Current Symptoms/Problems: The patient notes difficulty with mood management.   Patient Reported Schizophrenia/Schizoaffective Diagnosis in Past: No   Strengths: Playing video games, and helping out around the home.  Preferences: Watching Tv, playing on cell phone.  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   Type of Services Patient Feels are Needed: Medication Management / Individual Therapy   Initial Clinical Notes/Concerns: [facility] previous counseling involvement. Currently receiving Med Management from his PCP. No prior Inpatient hospitalizations for MH. No current S/I or H/I .   Mental Health Symptoms Depression:   Change in energy/activity; Difficulty Concentrating; Fatigue; Irritability; Sleep (too much or little); Hopelessness; Worthlessness   Duration of Depressive symptoms:  Greater than two weeks   Mania:   N/A   Anxiety:    Worrying; Tension; Sleep; Restlessness; Irritability; Fatigue; Difficulty concentrating   Psychosis:   None   Duration of Psychotic symptoms: NA   Trauma:   None   Obsessions:   None   Compulsions:   None   Inattention:   None   Hyperactivity/Impulsivity:   None   Oppositional/Defiant  Behaviors:   None   Emotional Irregularity:   None   Other Mood/Personality Symptoms:   NA    Mental Status Exam Appearance and self-care  Stature:   Average   Weight:   Overweight   Clothing:   Casual   Grooming:   Normal   Cosmetic use:   None   Posture/gait:   Normal   Motor activity:   Not Remarkable   Sensorium  Attention:   Normal   Concentration:   Anxiety interferes   Orientation:   X5   Recall/memory:   Normal   Affect and Mood  Affect:   Appropriate   Mood:   Anxious; Depressed   Relating  Eye contact:   Normal   Facial expression:   Responsive   Attitude toward examiner:   Cooperative   Thought and Language  Speech flow:  Normal   Thought content:   Appropriate to Mood and Circumstances   Preoccupation:   None   Hallucinations:   None   Organization:  Development worker, international aid of Knowledge:   Good   Intelligence:   Needs investigation (ASD)   Abstraction:   Normal   Judgement:   Good   Reality Testing:   Realistic   Insight:   Good   Decision Making:   Normal   Social Functioning  Social Maturity:   Responsible   Social Judgement:  Normal   Stress  Stressors:   Family conflict; Illness (Conflict within the family between patient and his Mother who he lives with. Diabetes.)   Coping Ability:   Normal   Skill Deficits:   None   Supports:   Family     Religion: Religion/Spirituality Are You A Religious Person?: No How Might This Affect Treatment?: NA  Leisure/Recreation: Leisure / Recreation Do You Have Hobbies?: Yes Leisure and Hobbies: Risk manager  Exercise/Diet: Exercise/Diet Do You Exercise?: No Have You Gained or Lost A Significant Amount of Weight in the Past Six Months?: No Do You Follow a Special Diet?: No Do You Have Any Trouble Sleeping?: Yes Explanation of Sleeping Difficulties: Difficulty with waking up in the middle of the night and not being able  to go back to sleep.   CCA Employment/Education Employment/Work Situation: Employment / Work Situation Employment Situation: On disability Why is Patient on Disability: ASD. How Long has Patient Been on Disability: The patient has been on disability since Middle School . Patient's Job has Been Impacted by Current Illness: No What is the Longest Time Patient has Held a Job?: NA Where was the Patient Employed at that Time?: NA Has Patient ever Been in the [employer]?: No  Education: Education Is Patient Currently Attending School?: No Last Grade Completed: 12 Name of High School: [employer] Did Garment/textile technologist From [employer]?: Yes Did You Attend College?: No Did You Attend Graduate School?: No Did You Have Any Special Interests In School?: NA Did You Have An Individualized Education Program (IIEP): No Did You Have Any Difficulty At School?: No Patient's Education Has Been Impacted by Current Illness: No   CCA Family/Childhood History Family and Relationship History: Family history Marital status: Single Are you sexually active?: No What is your sexual orientation?: Heterosexual. Has your sexual activity been affected by drugs, alcohol, medication, or emotional stress?: NA Does patient have children?: No  Childhood History:  Childhood History By whom was/is the patient raised?: Mother Additional childhood history information: No Additional Description of patient's relationship with caregiver when they were a child: The patient notes having a good relationsjip with his Mother when he was younger Patient's description of current relationship with people who raised him/her: The patient notes having a conflictual relationship with his Mother who he currently lives with . How were you disciplined when you got in trouble as a child/adolescent?: Grounding from electronics. Does patient have siblings?: Yes Number of Siblings: 1 Description of patient's current relationship  with siblings: The patient notes having a younger sister who is 50. The patient notes he has a good relationship with his sister . Did patient suffer any verbal/emotional/physical/sexual abuse as a child?: No Did patient suffer from severe childhood neglect?: No Has patient ever been sexually abused/assaulted/raped as an adolescent or adult?: No Was the patient ever a victim of a crime or a disaster?: No Witnessed domestic violence?: No Has patient been affected by domestic violence as an adult?: No  Child/Adolescent Assessment:     CCA Substance Use Alcohol/Drug Use: Alcohol / Drug Use Pain Medications: See MAR Prescriptions: See MAR Over the Counter: Metformin History of alcohol / drug use?: No history of alcohol / drug abuse Longest period of sobriety (when/how long): Pt denies abuse.                         ASAM's:  Six Dimensions of Multidimensional Assessment  Dimension 1:  Acute Intoxication and/or Withdrawal  Potential:      Dimension 2:  Biomedical Conditions and Complications:      Dimension 3:  Emotional, Behavioral, or Cognitive Conditions and Complications:     Dimension 4:  Readiness to Change:     Dimension 5:  Relapse, Continued use, or Continued Problem Potential:     Dimension 6:  Recovery/Living Environment:     ASAM Severity Score:    ASAM Recommended Level of Treatment:     Substance use Disorder (SUD)    Recommendations for Services/Supports/Treatments: Recommendations for Services/Supports/Treatments Recommendations For Services/Supports/Treatments: Individual Therapy, Medication Management  DSM5 Diagnoses: Patient Active Problem List   Diagnosis Date Noted   Gambling disorder, 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   Sore throat 03/24/2020   Constipation 11/17/2017   Rectal bleeding 11/17/2017   Esophageal dysphagia 11/17/2017   GERD (gastroesophageal reflux disease) 03/09/2017   Adjustment disorder with mixed anxiety and depressed mood 03/09/2017    Autism spectrum disorder 03/08/2017   Intellectual disability 11/04/2016   Auditory hallucinations    Learning disability 12/21/2014   ADD (attention deficit disorder) 11/24/2012   Morbid obesity (HCC) 11/24/2012    Patient Centered Plan: Patient is on the following Treatment Plan(s):  Recurrent Moderate MDD with Anxiety   Patient additionally has (ASD)   Referrals to Alternative Service(s): Referred to Alternative Service(s):   Place:   Date:   Time:    Referred to Alternative Service(s):   Place:   Date:   Time:    Referred to Alternative Service(s):   Place:   Date:   Time:    Referred to Alternative Service(s):   Place:   Date:   Time:      Collaboration of Care: No additional collaboration for this session.  Patient/Guardian was advised Release of Information must be obtained prior to any record release in order to collaborate their care with an outside provider. Patient/Guardian was advised if they have not already done so to contact the registration department to sign all necessary forms in order for [place] to release information regarding their care.   Consent: Patient/Guardian gives verbal consent for treatment and assignment of benefits for services provided during this visit. Patient/Guardian expressed understanding and agreed to proceed.    I discussed the assessment and treatment plan with the patient. The patient was provided an opportunity to ask questions and all were answered. The patient agreed with the plan and demonstrated an understanding of the instructions.   The patient was advised to call back or seek an in-person evaluation if the symptoms worsen or if the condition fails to improve as anticipated.  I provided 60 minutes of face-to-face time during this encounter.   Winfred Burn, LCSW  11/30/2022

## 2023-01-11 ENCOUNTER — Telehealth (HOSPITAL_COMMUNITY): Payer: Self-pay | Admitting: Clinical

## 2023-01-11 ENCOUNTER — Ambulatory Visit (HOSPITAL_COMMUNITY): Payer: No Typology Code available for payment source | Admitting: Clinical

## 2023-01-11 NOTE — Telephone Encounter (Signed)
The patient did not respond to contact attempts.  

## 2023-01-20 ENCOUNTER — Ambulatory Visit (INDEPENDENT_AMBULATORY_CARE_PROVIDER_SITE_OTHER): Payer: No Typology Code available for payment source | Admitting: Clinical

## 2023-01-20 DIAGNOSIS — F840 Autistic disorder: Secondary | ICD-10-CM | POA: Diagnosis not present

## 2023-01-20 DIAGNOSIS — F331 Major depressive disorder, recurrent, moderate: Secondary | ICD-10-CM | POA: Diagnosis not present

## 2023-01-20 DIAGNOSIS — F419 Anxiety disorder, unspecified: Secondary | ICD-10-CM

## 2023-01-20 NOTE — Progress Notes (Signed)
Virtual Visit via Telephone (Video Attempted Audio did not work)  I connected with Elijah [name] on 01/20/23 at  9:00 AM EDT by a telephone enabled telemedicine application and verified that I am speaking with the correct person using two identifiers.  Location: Patient: Home Provider: Office   I discussed the limitations of evaluation and management by telemedicine and the availability of in person appointments. The patient expressed understanding and agreed to proceed.  THERAPIST PROGRESS NOTE   Session Time: 8:15 AM- 8:45 AM   Participation Level: Active   Behavioral Response: CasualAlertHyperactive   Type of Therapy: Individual Therapy   Treatment Goals addressed: Coping for MH diagnosis   Interventions: CBT, Motivational Interviewing, Strength-based and Supportive   Summary: Elijah [name] is a 27 y.o. male who presents with  ASD/ Depression with Anxiety.The OPT therapist worked with the patient for his ongoing OPT treatment.. The OPT therapist utilized Motivational Interviewing to assist in creating therapeutic repore. The OPT therapist gained feedback about the patients triggers and symptoms over the past few week.The patient spoke about interactions at home over the past few weeks. The patient spoke about his adjusting to transition to his Summer Break and going on vacation with his friend to the beach. The patient spoke about his interactions in the home with his Mother and Step Father.The OPT therapist utilized Cognitive Behavioral Therapy through cognitive restructuring as well as worked on coping strategies to assist in management of his mental health symptoms. The patient spoke about his ongoing work to figure out his identity and struggling with being 37 but not working and on disability. The OPT therapist worked with the patient to add structure including potentially linking with Vocational Rehabilitation. The OPT therapist worked with the patient overviewing upcoming  listed MyChart appointments.   Suicidal/Homicidal: Nowithout intent/plan   Therapist Response: The OPT therapist worked with the patient for the patients scheduled session. The patient was engaged in his session and gave feedback in relation to triggers, symptoms, and behavior responses over the past few weeks. The patient spoke about his Summer Break vacation to the beach. The patient spoke about wanting to get a job and difficulty with budgeting money from his Disability check.The OPT therapist worked utilizing an in Psychologist, forensic Therapy exercise. The OPT therapist assessed the patients behaviors and interactions with others . The OPT therapist gauged with the patient his current stressors and implementation of coping/ leisure.The OP therapist worked with the patient offering resource [employer]. The OPT therapist will continue treatment work with the patient in his next session.      Plan: Follow up in 2/3 weeks   Diagnosis:      Axis I:  Recurrent Moderate MDD with Anxiety/ ASD                         Axis II: No diagnosis   Collaboration of Care: No additional collaboration for this session.    Patient/Guardian was advised Release of Information must be obtained prior to any record release in order to collaborate their care with an outside provider. Patient/Guardian was advised if they have not already done so to contact the registration department to sign all necessary forms in order for [place] to release information regarding their care.    Consent: Patient/Guardian gives verbal consent for treatment and assignment of benefits for services provided during this visit. Patient/Guardian expressed understanding and agreed to proceed.    I discussed the  assessment and treatment plan with the patient. The patient was provided an opportunity to ask questions and all were answered. The patient agreed with the plan and demonstrated an understanding of the  instructions.   The patient was advised to call back or seek an in-person evaluation if the symptoms worsen or if the condition fails to improve as anticipated.   I provided 30 minutes of non-face-to-face time during this encounter.     Suzan Garibaldi, LCSW   01/20/2023

## 2023-02-07 ENCOUNTER — Ambulatory Visit: Payer: Medicaid Other | Admitting: Family Medicine

## 2023-03-02 ENCOUNTER — Ambulatory Visit (HOSPITAL_COMMUNITY): Payer: No Typology Code available for payment source | Admitting: Clinical

## 2023-03-09 ENCOUNTER — Ambulatory Visit (HOSPITAL_COMMUNITY): Payer: MEDICAID | Admitting: Clinical

## 2023-03-09 DIAGNOSIS — F840 Autistic disorder: Secondary | ICD-10-CM | POA: Diagnosis not present

## 2023-03-09 DIAGNOSIS — F419 Anxiety disorder, unspecified: Secondary | ICD-10-CM

## 2023-03-09 DIAGNOSIS — F331 Major depressive disorder, recurrent, moderate: Secondary | ICD-10-CM

## 2023-03-09 NOTE — Progress Notes (Signed)
Virtual Visit via Video Note  I connected with Elijah [name] on 03/09/23 at  3:00 PM EDT by a video enabled telemedicine application and verified that I am speaking with the correct person using two identifiers.  Location: Patient: home Provider: office   I discussed the limitations of evaluation and management by telemedicine and the availability of in person appointments. The patient expressed understanding and agreed to proceed.   THERAPIST PROGRESS NOTE   Session Time: 3:00 PM- 3:30 P M   Participation Level: Active   Behavioral Response: CasualAlertHyperactive   Type of Therapy: Individual Therapy   Treatment Goals addressed: Coping for MH diagnosis   Interventions: CBT, Motivational Interviewing, Strength-based and Supportive   Summary: Elijah [name] is a 27 y.o. male who presents with  ASD/ Depression with Anxiety.The OPT therapist worked with the patient for his ongoing OPT treatment.. The OPT therapist utilized Motivational Interviewing to assist in creating therapeutic repore. The OPT therapist gained feedback about the patients triggers and symptoms over the past few week.The patient spoke about interactions at home over the past few weeks noting conflict with his step father which led to the patient being kicked out of the home and going to live with his aunt and uncle. The patient spoke about feeling like his step father tries to give him tough love but it just caused a conflict between them because it triggered the patient and they had words and things escalated.The OPT therapist utilized Cognitive Behavioral Therapy through cognitive restructuring as well as worked on coping strategies to assist in management of his mental health symptoms. The patient spoke about his ongoing work to figure out his identity and struggling with being 36 but not working and on disability. The OPT therapist worked with the patient to add structure including potentially linking with  Vocational Rehabilitation. The patient spoke about his focus to try to be more independent to get transportation and a job. The patient reviewed and sequenced the event between himself and his stepfather in session and worked with the patient on his emotion control. The OPT therapist worked with the patient overviewing upcoming listed MyChart appointments.   Suicidal/Homicidal: Nowithout intent/plan   Therapist Response: The OPT therapist worked with the patient for the patients scheduled session. The patient was engaged in his session and gave feedback in relation to triggers, symptoms, and behavior responses over the past few weeks. The patient spoke about his Summer Break vacation to the beach. The patient spoke about wanting to get a job and difficulty with budgeting money from his Disability check.The OPT therapist worked utilizing an in Psychologist, forensic Therapy exercise. The OPT therapist assessed the patients behaviors and interactions with others . The OPT therapist gauged with the patient his current stressors and implementation of coping/ leisure.The OP therapist worked with the patient offering resource [employer]. The OPT therapist will continue treatment work with the patient in his next session.      Plan: Follow up in 2/3 weeks   Diagnosis:      Axis I:  Recurrent Moderate MDD with Anxiety/ ASD                         Axis II: No diagnosis   Collaboration of Care: No additional collaboration for this session.    Patient/Guardian was advised Release of Information must be obtained prior to any record release in order to collaborate their care with an outside provider. Patient/Guardian  was advised if they have not already done so to contact the registration department to sign all necessary forms in order for [place] to release information regarding their care.    Consent: Patient/Guardian gives verbal consent for treatment and assignment of  benefits for services provided during this visit. Patient/Guardian expressed understanding and agreed to proceed.    I discussed the assessment and treatment plan with the patient. The patient was provided an opportunity to ask questions and all were answered. The patient agreed with the plan and demonstrated an understanding of the instructions.   The patient was advised to call back or seek an in-person evaluation if the symptoms worsen or if the condition fails to improve as anticipated.   I provided 30 minutes of non-face-to-face time during this encounter.     Suzan Garibaldi, LCSW   03/09/2023

## 2023-03-28 ENCOUNTER — Ambulatory Visit (INDEPENDENT_AMBULATORY_CARE_PROVIDER_SITE_OTHER): Payer: MEDICAID | Admitting: Clinical

## 2023-03-28 DIAGNOSIS — F331 Major depressive disorder, recurrent, moderate: Secondary | ICD-10-CM | POA: Diagnosis not present

## 2023-03-28 DIAGNOSIS — F840 Autistic disorder: Secondary | ICD-10-CM

## 2023-03-28 DIAGNOSIS — F419 Anxiety disorder, unspecified: Secondary | ICD-10-CM | POA: Diagnosis not present

## 2023-03-28 NOTE — Progress Notes (Signed)
Virtual Visit via Telephone Note (Video attempted first was unsuccessful)  I connected with Elijah [name] on 03/28/23 at 10:00 AM EDT by telephone and verified that I am speaking with the correct person using two identifiers.  Location: Patient: home Provider: office   I discussed the limitations, risks, security and privacy concerns of performing an evaluation and management service by telephone and the availability of in person appointments. I also discussed with the patient that there may be a patient responsible charge related to this service. The patient expressed understanding and agreed to proceed.   THERAPIST PROGRESS NOTE   Session Time: 10:00 AM- 10:30 AM   Participation Level: Active   Behavioral Response: CasualAlertHyperactive   Type of Therapy: Individual Therapy   Treatment Goals addressed: Coping for MH diagnosis   Interventions: CBT, Motivational Interviewing, Strength-based and Supportive   Summary: Elijah [name] is a 27 y.o. male who presents with  ASD/ Depression with Anxiety.The OPT therapist worked with the patient for his ongoing OPT treatment.. The OPT therapist utilized Motivational Interviewing to assist in creating therapeutic repore. The OPT therapist gained feedback about the patients triggers and symptoms over the past few week.The patient spoke about interactions at home over the past few weeks noting conflict has led to him using negative coping and gambling leading him into further financial trouble.The OPT therapist utilized Cognitive Behavioral Therapy through cognitive restructuring as well as worked on coping strategies to assist in management of his mental health symptoms. 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 The OPT therapist worked with the patient overviewing  upcoming listed MyChart appointments.   Suicidal/Homicidal: Nowithout intent/plan   Therapist Response: The OPT therapist worked with the patient for the patients scheduled session. The patient was engaged in his session and gave feedback in relation to triggers, symptoms, and behavior responses over the past few weeks. The patient spoke about his past few weeks and ongoing work to get a job and difficulty with budgeting money from his Disability check due to impulsivity and negative coping through gambling.The OPT therapist worked utilizing an in Psychologist, forensic Therapy exercise. The OPT therapist assessed the patients behaviors and interactions with others . 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 The OPT therapist will continue treatment work with the patient in his next session.      Plan: Follow up in 2/3 weeks   Diagnosis:      Axis I:  Recurrent Moderate MDD with Anxiety/ ASD                         Axis II: No diagnosis   Collaboration of Care: No additional collaboration for this session.    Patient/Guardian was advised Release of Information must be obtained prior to any record release in order to collaborate their care with an outside provider. Patient/Guardian was advised if they have not already done so to contact the registration department to sign all necessary forms in order for [place] to release information regarding their care.    Consent: Patient/Guardian gives verbal consent for treatment and assignment of benefits for services provided during this visit. Patient/Guardian expressed understanding and agreed to proceed.  I discussed the assessment and treatment plan with the patient. The patient was provided an opportunity to ask questions and all were answered. The patient agreed with the plan and  demonstrated an understanding of the instructions.   The patient was advised to call back or seek an in-person evaluation if the symptoms worsen or if the condition fails to improve as anticipated.   I provided 30 minutes of non-face-to-face time during this encounter.     Suzan Garibaldi, LCSW   03/09/2023

## 2023-03-29 ENCOUNTER — Ambulatory Visit: Payer: Medicaid Other | Admitting: Nutrition

## 2023-04-15 LAB — BASIC METABOLIC PANEL (7)
BUN/Creatinine Ratio: 15 (ref 9–20)
BUN: 11 mg/dL (ref 6–20)
CO2: 21 mmol/L (ref 20–29)
Chloride: 103 mmol/L (ref 96–106)
Creatinine, Ser: 0.75 mg/dL — ABNORMAL LOW (ref 0.76–1.27)
Glucose: 103 mg/dL — ABNORMAL HIGH (ref 70–99)
Potassium: 4.3 mmol/L (ref 3.5–5.2)
Sodium: 140 mmol/L (ref 134–144)
eGFR: 128 mL/min/{1.73_m2} (ref >59)

## 2023-04-15 LAB — LIPID PANEL
Chol/HDL Ratio: 5 ratio (ref 0.0–5.0)
Cholesterol, Total: 186 mg/dL (ref 100–199)
HDL: 37 mg/dL — ABNORMAL LOW (ref >39)
LDL Chol Calc (NIH): 133 mg/dL — ABNORMAL HIGH (ref 0–99)
Triglycerides: 85 mg/dL (ref 0–149)
VLDL Cholesterol Cal: 16 mg/dL (ref 5–40)

## 2023-04-15 LAB — HEMOGLOBIN A1C
Est. average glucose Bld gHb Est-mCnc: 134 mg/dL
Hgb A1c MFr Bld: 6.3 % — ABNORMAL HIGH (ref 4.8–5.6)

## 2023-04-24 ENCOUNTER — Ambulatory Visit: Payer: MEDICAID | Admitting: Family Medicine

## 2023-04-24 VITALS — BP 126/84 | HR 85 | Temp 97.9°F | Ht 65.0 in | Wt 224.0 lb

## 2023-04-24 DIAGNOSIS — R7303 Prediabetes: Secondary | ICD-10-CM | POA: Diagnosis not present

## 2023-04-24 DIAGNOSIS — Z5900 Homelessness unspecified: Secondary | ICD-10-CM

## 2023-04-24 NOTE — Progress Notes (Signed)
Subjective:    Patient ID: Elijah [name], male    DOB: [birth year], 27 y.o.   MRN: [id number]  Discussed the use of AI scribe software for clinical note transcription with the patient, who gave verbal consent to proceed.  History of Present Illness   The patient, who works a night shift job in a warehouse, presents with complaints of leg pain and foot cramping. He describes the discomfort as severe and notes that it has led to a change in his work position. The patient also reports difficulty sleeping, which he attributes to his night shift schedule. He attempts to create a dark environment for sleep during the day, but still struggles with sleep quality.  Additionally, the patient has been experiencing a rash that extends from the front to the back of his neck. The rash seems to be associated with his work environment, as it only appears when he is working and resolves when he is off work. The patient believes it may be due to heat and sweat, as his job in the warehouse is physically demanding and often hot.  The patient also reports significant life stressors, including financial difficulties and unstable housing. He is currently living in his car and occasionally staying with relatives. He has been dealing with depression and stress, which he believes may be affecting his health. He also mentions a recent end to a romantic relationship, which has added to his emotional distress.  The patient has a history of diabetes and has been managing it with metformin. He reports that his blood sugar levels have improved, with a current level of 6.3. He also has slightly elevated cholesterol levels. The patient is currently receiving disability benefits but has been struggling with managing his finances due to a gambling problem. He has loans from previous vehicles that he is still paying off. Despite these challenges, the patient is trying to maintain his independence and control over his finances.          Review of Systems     Objective:    Physical Exam   CHEST: lungs clear to auscultation CARDIOVASCULAR: normal heart sounds ABDOMEN: soft, no swelling EXTREMITIES: no swelling no rash currently           Assessment & Plan:  Assessment and Plan    Leg Cramps Reports leg cramps and foot pain, possibly related to work conditions. -Consider footwear modification or use of supportive insoles.  Sleep Disturbance Reports poor sleep, likely related to night shift work. -Encourage maintaining a dark, quiet sleep environment and consider use of sleep mask.  Heat Rash Reports rash in the context of working in a hot environment and excessive sweating. Rash improves when not working. -Advise changing into a dry shirt during work breaks to minimize skin moisture.  Depression Reports feelings of depression, stress, and financial difficulties. Currently living in his car and experiencing social isolation. -Refer to Child psychotherapist for assistance with housing and Landscape architect. -Consider mental health evaluation for possible treatment of depression.  Prediabetes HbA1c of 6.3, improved from previous. -Continue Metformin once daily. -Encourage continued dietary control.  Follow-up in 3-4 months.      This young patient does have developmental issues.  Causing some cognitive issues as well.  Because of this concepts regarding self-care are not evident for him.  He was living at home with his parents until there came some disagreements with his father-in-law and patient states that he was having a gambling issue.  He states that the  reason he is no longer living with them.  He states he has been staying with friends but sometimes some staying by himself in a car states he sometimes eats sometimes skips eating because of cost  Patient would benefit from having a consultation with a Child psychotherapist and perhaps be able to find him low-cost housing.  In addition to this he would benefit  from having a Child psychotherapist through social services partner with him to try to make sure that he does not spend his finances on gambling.  Unfortunately this patient has impairment of judgment which increases the likelihood that he will miss spending his money  He has a follow-up within 3 to 4 months No need to add any medicines currently no lab work before the next visit follow-up in approximately 3 to 4 months

## 2023-05-01 ENCOUNTER — Other Ambulatory Visit: Payer: Self-pay

## 2023-05-01 DIAGNOSIS — Z5900 Homelessness unspecified: Secondary | ICD-10-CM

## 2023-05-02 ENCOUNTER — Telehealth: Payer: Self-pay | Admitting: *Deleted

## 2023-05-02 NOTE — Progress Notes (Signed)
  Care Coordination   Note   05/02/2023 Name: Elijah [name] MRN: [id number] DOB: August 21, 1995  Elijah [name] is a 27 y.o. year old male who sees Luking, Jonna Coup, MD for primary care. I reached out to [name] by phone today in response to a referral  Follow up plan:  Telephone appointment with care coordination team member scheduled for:  05/05/2023  Encounter Outcome:  Patient Scheduled from referral   Burman Nieves, [facility] Care Coordination Care Guide Direct Dial: [phone]

## 2023-05-03 ENCOUNTER — Encounter: Payer: Self-pay | Admitting: Nutrition

## 2023-05-03 ENCOUNTER — Ambulatory Visit: Payer: Self-pay | Admitting: *Deleted

## 2023-05-03 ENCOUNTER — Ambulatory Visit (HOSPITAL_COMMUNITY): Payer: MEDICAID | Admitting: Clinical

## 2023-05-03 ENCOUNTER — Encounter: Payer: MEDICAID | Attending: Family Medicine | Admitting: Nutrition

## 2023-05-03 VITALS — Ht 65.0 in | Wt 227.0 lb

## 2023-05-03 DIAGNOSIS — E118 Type 2 diabetes mellitus with unspecified complications: Secondary | ICD-10-CM | POA: Diagnosis present

## 2023-05-03 DIAGNOSIS — E782 Mixed hyperlipidemia: Secondary | ICD-10-CM | POA: Diagnosis present

## 2023-05-03 NOTE — Progress Notes (Signed)
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 Learning readiness: Ready    NUTRITION ASSESSMENT   DM Follow up Has lost  from 244 lbs down to 227 lbs.17 lbs. Has been eating more fruits and vegetables when he can. Just lost his job. Is living in his car for now. Has SW appt on Friday to help with housing and other needs. Provided information for local food pantries and local resources. A1C down to 6.3%. He has his medications. Cholesterol has come down nicely also. Making Metformin 500 mg daily. Saw Dr. Lilyan Punt recently.  Previous goals set: Cut out processed foods of bacon and bologna---done Increase fruits and vegetables- Was doing well when he had job and a place to live. Not doing as much now. Keep drinking water-done  Walk for 20 minutes a day -done Lose 1 lb per week-lost 17 lbs since last visit.  Anthropometrics  Wt Readings from Last 3 Encounters:  04/24/23 224 lb (101.6 kg)  11/28/22 244 lb (110.7 kg)  11/22/22 238 lb 6.4 oz (108.1 kg)   Ht Readings from Last 3 Encounters:  04/24/23 5\' 5"  (1.651 m)  11/28/22 5\' 5"  (1.651 m)  11/22/22 5\' 5"  (1.651 m)   There is no height or weight on file to calculate BMI. @BMIFA @ Facility age limit for growth %iles is 20 years. Facility age limit for growth %iles is 20 years.    Clinical Medical Hx: Dm Type 2, Obesity and Hyperlipidemia. Medications: Metformin 500 mg a day.  Labs:  Lab Results  Component Value Date   HGBA1C 6.3 (H) 04/14/2023      Latest Ref Rng & Units 04/14/2023    8:03 AM 11/08/2022    8:14 AM 08/09/2022   10:03 AM  CMP  Glucose 70 - 99 mg/dL 161  96  096   BUN 6 - 20 mg/dL 11  12  11    Creatinine 0.76 - 1.27 mg/dL 0.45  4.09  8.11   Sodium 134 - 144 mmol/L 140  139  137   Potassium 3.5 - 5.2 mmol/L 4.3  4.5  4.3   Chloride 96 - 106 mmol/L  103  102  98   CO2 20 - 29 mmol/L 21  22  22    Calcium 8.7 - 10.2 mg/dL   91.4   Total Protein 6.0 - 8.5 g/dL   7.6   Total Bilirubin 0.0 - 1.2 mg/dL   0.2   Alkaline Phos 44 - 121 IU/L   40   AST 0 - 40 IU/L   13   ALT 0 - 44 IU/L   17    Lipid Panel     Component Value Date/Time   CHOL 186 04/14/2023 0803   TRIG 85 04/14/2023 0803   HDL 37 (L) 04/14/2023 0803   CHOLHDL 5.0 04/14/2023 0803   CHOLHDL 5.3 03/10/2017 0643   VLDL 38 03/10/2017 0643   LDLCALC 133 (H) 04/14/2023 0803   LABVLDL 16 04/14/2023 0803    Notable Signs/Symptoms: None  Lifestyle & Dietary Hx HOmeless right now.. Just dx a month ago.   Estimated daily fluid intake: 40 oz Supplements:  Sleep: 8 Stress / self-care: none Current average weekly physical activity: Just started walking some  24-Hr Dietary Recall Ate bowl of cornflakes this am. Drinking water Limited food supply due to homelss and living in  car. Lost job recently.    Estimated Energy Needs Calories: 1500 Carbohydrate: 170g Protein: 112g Fat: 42g   NUTRITION DIAGNOSIS  NB-1.1 Food and nutrition-related knowledge deficit As related to high fat, high sugar processed diet.  As evidenced by A1C 7.5%, Hyperlipidemia and Obesity.   NUTRITION INTERVENTION  Nutrition education (E-1) on the following topics:  Nutrition and Diabetes education provided on My Plate, CHO counting, meal planning, portion sizes, timing of meals, avoiding snacks between meals unless having a low blood sugar, target ranges for A1C and blood sugars, signs/symptoms and treatment of hyper/hypoglycemia, monitoring blood sugars, taking medications as prescribed, benefits of exercising 30 minutes per day and prevention of complications of DM.  Lifestyle Medicine  - Whole Food, Plant Predominant Nutrition is highly recommended: Eat Plenty of vegetables, Mushrooms, fruits, Legumes, Whole Grains, Nuts, seeds in lieu of processed meats, processed snacks/pastries red meat,  poultry, eggs.    -It is better to avoid simple carbohydrates including: Cakes, Sweet Desserts, Ice Cream, Soda (diet and regular), Sweet Tea, Candies, Chips, Cookies, Store Bought Juices, Alcohol in Excess of  1-2 drinks a day, Lemonade,  Artificial Sweeteners, Doughnuts, Coffee Creamers, "Sugar-free" Products, etc, etc.  This is not a complete list.....  Exercise: If you are able: 30 -60 minutes a day ,4 days a week, or 150 minutes a week.  The longer the better.  Combine stretch, strength, and aerobic activities.  If you were told in the past that you have high risk for cardiovascular diseases, you may seek evaluation by your heart doctor prior to initiating moderate to intense exercise programs.   Handouts Provided Include  My Plate Calorie density of foods   Learning Style & Readiness for Change Teaching method utilized: Visual & Auditory  Demonstrated degree of understanding via: Teach Back  Barriers to learning/adherence to lifestyle change: none  Goals Established by Pt Goals Contact local food pantry for assistance Talk with social worker on Friday for housing. Keep drinking water and avoid sodas.    MONITORING & EVALUATION Dietary intake, weekly physical activity, and weight in 3 month.  Next Steps  Patient is to work on eating more foods that grow in a garden of friuts, vegetables and cutting out fast and processed foods.Marland Kitchen

## 2023-05-03 NOTE — Patient Instructions (Addendum)
Goals Contact local food pantry for assistance Talk with social worker on Friday for housing. Keep drinking water and avoid sodas

## 2023-05-05 ENCOUNTER — Ambulatory Visit: Payer: Self-pay | Admitting: *Deleted

## 2023-05-05 NOTE — Patient Instructions (Signed)
Visit Information  Thank you for taking time to visit with me today. Please don't hesitate to contact me if I can be of assistance to you.   Following are the goals we discussed today:   Goals Addressed               This Visit's Progress     Receive [employer], Services, Resources, & Referrals. 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, Attentio Deficit Disorder]  General Interventions   General Interventions Discussed/Reviewed General Interventions Discussed, Labs, Vaccines, Doctor Visits, General Interventions Reviewed, Annual Eye Exam, Annual Foot Exam, Health Screening, Communication with, Level of Care, Community Resources  [Encouraged Routine Medical Management]  Labs Hgb A1c every 3 months, Kidney Function  [Encouraged Routine Labs]  Vaccines COVID-19, Flu, Pneumonia, RSV, Shingles, Tetanus/Pertussis/Diphtheria  [Encouraged Immunizations & Vaccinations]  Doctor Visits Discussed/Reviewed Doctor Visits Discussed, Specialist, Doctor Visits Reviewed, Annual Wellness Visits, PCP  [Encouraged Attendance]  Health Screening Colonoscopy, Prostate  [Encouraged Routine Health Screenings]  PCP/Specialist Visits Compliance with follow-up visit  [Encouraged]  Communication with PCP/Specialists  [Collaboration with Primary Care Provider to Report Findings of Assessment]  Level of Care Applications, Assisted Living  [place], Provided, & Encouraged Completion & Consideration]  Applications Medicaid, Other  [Confirmed Active Medicaid Coverage]  Exercise Interventions   Exercise Discussed/Reviewed Exercise Discussed, Exercise Reviewed,  Physical Activity, Weight Managment  [Encouraged Increased Level of Activity & Exercise, as Tolerated & Encouraged Weight Loss & Health Diet]  Physical Activity Discussed/Reviewed Physical Activity Discussed, Home Exercise Program (HEP), PREP, Gym, Types of exercise, Physical Activity Reviewed  [Encouraged]  Weight Management Weight loss  [Encouraged]  Education Interventions   Education Provided Provided Therapist, sports, Provided Web-based Education, Provided Education  [employer], Confirmed Receipt, & Thoroughly Reviewed to [employer] Understanding]  Provided Verbal Education On Nutrition, Mental Health/Coping with Illness, When to see the doctor, Foot Care, Eye Care, Labs, Applications, Exercise, Medication, Development worker, community, [employer] Resources  [Encouraged]  Labs Reviewed --  [N/A]  Applications Medicaid, Other  [Confirmed Active Medicaid Coverage]  Mental Health Interventions   Mental Health Discussed/Reviewed Mental Health Discussed, Anxiety, Depression, Grief and Loss, Mental Health Reviewed, Coping Strategies, Substance Abuse, Suicide, Crisis, Other  [Domestic Violence - None Present, Reviewed Positive Coping Mechanisms & Encouraged Daily Implementation]  Nutrition Interventions   Nutrition Discussed/Reviewed Nutrition Discussed, Adding fruits and vegetables, Increasing proteins, Decreasing fats, Nutrition Reviewed, Fluid intake, Decreasing salt, Portion sizes, Carbohydrate meal planning, Decreasing sugar intake, Supplemental nutrition  [Encouraged]  Pharmacy Interventions   Pharmacy Dicussed/Reviewed Pharmacy Topics Discussed, Medications and their functions, Medication Adherence, Pharmacy Topics Reviewed, Affording Medications  [Encouraged Administation of Medications Exactly as Prescribed]  Medication Adherence --  [N/A]  Safety Interventions   Safety Discussed/Reviewed Safety Discussed, Safety Reviewed  [Discussed Safety Techniques & Encouraged Implementation]  Advanced Directive  Interventions   Advanced Directives Discussed/Reviewed Advanced Directives Discussed  [Encouraged Completion]      Assessed Social Determinant of Health Barriers. Discussed Plans for Ongoing Care Management Follow Up. Provided Careers information officer Information for Care Management Team Members. Screened for Signs & Symptoms of Depression, Related to Chronic Disease State.  PHQ2 & PHQ9 Depression Screen Completed & Results Reviewed.  Suicidal Ideation &  Homicidal Ideation Assessed - None Present.   Domestic Violence Assessed - None Present. Access to Weapons Assessed - None Present.   Active Listening & Reflection Utilized.  Verbalization of Feelings Encouraged.  Emotional Support Provided. Feelings of Anxiety & Stress Validated. Symptoms of Loneliness & Depression Acknowledged. Crisis Support Information, Agencies, Services, & Resources Discussed. Problem Solving Interventions Identified. Task-Centered Solutions Implemented.   Solution-Focused Strategies Developed. Acceptance & Commitment Therapy Introduced. Brief Cognitive Behavioral Therapy Initiated. Client-Centered Therapy Enacted. Reviewed Prescription Medications & Discussed Importance of Compliance. Encouraged Administration of Medications Exactly as Prescribed. Quality of Sleep Assessed & Sleep Hygiene Techniques Promoted. Encouraged Daily Implementation of Deep Breathing Exercises, Relaxation Techniques, & Mindfulness Meditation Strategies. Encouraged Increased Level of Activity & Exercise, as Tolerated. Encouraged Engagement in Activities of Interest, Inside & Outside the Home. Encouraged Journaling, As a Means to Express Thoughts, Feelings, & Emotions. Encouraged Self-Enrollment with Psychiatrist of Interest in [facility], from List Provided, to Receive Psychotropic Medication Administration & Management, in An Effort to Reduce & Manage Symptoms of Anxiety & Depression. Encouraged Self-Enrollment with Therapist of Interest  in [facility], from List Provided, to Receive Psychotherapeutic Counseling & Supportive Services, in An Effort to Reduce & Manage Symptoms of Anxiety & Depression. Confirmed Receipt & Encouraged Review of The Following List of [employer], Services, Resources, Occupational psychologist, Emailed on 05/03/2023: ~ Advertising account executive ~ TEFL teacher ~ Emergency Assistance Programs ~ Corporate investment banker Programs in [place] Encouraged Engagement with [employer], Nurse, adult, Field seismologist of Interest in [place], from List Provided, in An Effort to [employer] with [employer] Encouraged Completion of Applications for [employer] Program & Energy Programs of Interest in [place], from List Provided, in An Effort to [employer] with Rent & Utilities, Notifying CSW if You Need Assistance with Application Completion & Submission. Confirmed Receipt & Encouraged Review of The Following List of [employer], [employer], Resources, Occupational psychologist, Emailed on 05/03/2023:          ~ Low Income Housing in [place], [place] ~ Low Income Housing in [place], [place] ~ Housing Resources ~ [employer] Housing Resources ~ [employer] Housing Resources ~ Affordable Apartments & Rental Properties  ~ [employer] Independent Living Program Flyer ~ Section 8 Housing Application Encouraged Engagement with [employer], Nurse, adult, & Resources of Interest in [place] [employer], from List Provided, in An Effort to [employer], Affordable, Handicapped Accessible, Low-Income Housing. Encouraged Completion of Applications for Low-Income, Affordable, & Section 8 Housing of Interest in [facility], from List Provided, in An Effort to [employer], Affordable, Handicapped Accessible, Low-Income Housing, Notifying CSW if [place] Need Assistance with Application  Completion & Submission. Confirmed Active Adult Medicaid Coverage & Benefits, through The [facility] of Social Services [phone]). Confirmed Active Food Stamps Recipient, in The Amount of $220.00 Per Month, through The [facility] of [facility] [phone]). Confirmed Approval for Social Security Disability, in The Amount of $943.00 Per Month, through [employer] [phone]# [phone]). Encouraged Engagement with Danford Bad, Social Work Case Manager with [facility] [phone]), if You Have Questions, Need Assistance, or If Additional Social Work Needs Are Identified Between Now & Our Next Follow-Up Outreach Call, Scheduled on 06/02/2023 at 2:15 PM. Encouraged Attendance at Follow-Up Appointment with Dr. Lilyan Punt, Primary Care Provider with [facility] [phone]), Scheduled on 08/24/2023 at 10:20 AM.  Our next appointment is by telephone on 06/02/2023 at 2:15 pm.  Please call the care guide team at [phone] if you need to cancel or reschedule your appointment.   If you are experiencing a Mental Health or Behavioral Health Crisis or need someone to talk to, please call the Suicide and Crisis Lifeline: 988 call the [place] National Suicide Prevention Lifeline: 570-770-5960 or TTY: 802-253-8625 TTY 4143817424) to talk to a trained counselor call 1-800-273-TALK (toll free, 24 hour hotline) go to [facility] [address], [place] [phone]) call the [facility] Crisis Line: [phone] call 911  Patient verbalizes understanding of instructions and care plan provided today and agrees to view in MyChart. Active MyChart status and patient understanding of how to access instructions and care plan via MyChart confirmed with patient.     Telephone follow up appointment with care management team member scheduled for:   06/02/2023 at 2:15 pm.  Danford Bad, BSW, MSW, LCSW  Embedded Practice Social Work Case Manager  [facility], Population Health Direct Dial: [phone]  Fax: [phone] Email: [email or web address]: [email or web address]

## 2023-05-05 NOTE — Patient Outreach (Signed)
Care Coordination   Follow Up Visit Note   05/05/2023  Name: Elijah [name] MRN: [id number] DOB: 1996/07/21  Elijah [name] is a 27 y.o. year old male who sees Luking, Jonna Coup, MD for primary care. I spoke with Elijah [name] by phone today.  What matters to the patients health and wellness today?  Receive [employer], Services, Resources, & Referrals.    Goals Addressed               This Visit's Progress     Receive [employer], Services, Resources, & Referrals. 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, Attentio Deficit Disorder]  General Interventions   General Interventions Discussed/Reviewed General Interventions Discussed, Labs, Vaccines, Doctor Visits, General Interventions Reviewed, Annual Eye Exam, Annual Foot Exam, Health Screening, Communication with, Level of Care, Community Resources  [Encouraged Routine Medical Management]  Labs Hgb A1c every 3 months, Kidney Function  [Encouraged Routine Labs]  Vaccines COVID-19, Flu, Pneumonia, RSV, Shingles, Tetanus/Pertussis/Diphtheria  [Encouraged Immunizations & Vaccinations]  Doctor Visits Discussed/Reviewed Doctor Visits Discussed, Specialist, Doctor Visits Reviewed, Annual Wellness Visits, PCP  [Encouraged Attendance]  Health Screening Colonoscopy, Prostate  [Encouraged Routine Health Screenings]  PCP/Specialist Visits Compliance with follow-up visit  [Encouraged]  Communication with PCP/Specialists  [Collaboration with Primary Care Provider to Report Findings of Assessment]  Level of Care Applications, Assisted Living  [place], Provided, & Encouraged  Completion & Consideration]  Applications Medicaid, Other  [Confirmed Active Medicaid Coverage]  Exercise Interventions   Exercise Discussed/Reviewed Exercise Discussed, Exercise Reviewed, Physical Activity, Weight Managment  [Encouraged Increased Level of Activity & Exercise, as Tolerated & Encouraged Weight Loss & Health Diet]  Physical Activity Discussed/Reviewed Physical Activity Discussed, Home Exercise Program (HEP), PREP, Gym, Types of exercise, Physical Activity Reviewed  [Encouraged]  Weight Management Weight loss  [Encouraged]  Education Interventions   Education Provided Provided Therapist, sports, Provided Web-based Education, Provided Education  [employer], Confirmed Receipt, & Thoroughly Reviewed to [employer] Understanding]  Provided Verbal Education On Nutrition, Mental Health/Coping with Illness, When to see the doctor, Foot Care, Eye Care, Labs, Applications, Exercise, Medication, Development worker, community, [employer] Resources  [Encouraged]  Labs Reviewed --  [N/A]  Applications Medicaid, Other  [Confirmed Active Medicaid Coverage]  Mental Health Interventions   Mental Health Discussed/Reviewed Mental Health Discussed, Anxiety, Depression, Grief and Loss, Mental Health Reviewed, Coping Strategies, Substance Abuse, Suicide, Crisis, Other  [Domestic Violence - None Present, Reviewed Positive Coping Mechanisms & Encouraged Daily Implementation]  Nutrition Interventions   Nutrition Discussed/Reviewed Nutrition Discussed, Adding fruits and vegetables, Increasing proteins, Decreasing fats, Nutrition Reviewed, Fluid intake, Decreasing salt, Portion sizes, Carbohydrate meal planning, Decreasing sugar intake, Supplemental nutrition  [Encouraged]  Pharmacy Interventions   Pharmacy Dicussed/Reviewed Pharmacy Topics Discussed, Medications and their functions, Medication Adherence, Pharmacy Topics Reviewed, Affording Medications  [Encouraged Administation of Medications Exactly as Prescribed]   Medication Adherence --  [N/A]  Safety Interventions   Safety Discussed/Reviewed Safety Discussed, Safety Reviewed  [Discussed Safety Techniques & Encouraged Implementation]  Advanced Directive Interventions   Advanced Directives Discussed/Reviewed Advanced Directives Discussed  [Encouraged Completion]      Assessed Social Determinant of Health Barriers. Discussed Plans for Ongoing Care Management Follow Up. Provided Direct  Contact Information for Care Management Team Members. Screened for Signs & Symptoms of Depression, Related to Chronic Disease State.  PHQ2 & PHQ9 Depression Screen Completed & Results Reviewed.  Suicidal Ideation & Homicidal Ideation Assessed - None Present.   Domestic Violence Assessed - None Present. Access to Weapons Assessed - None Present.   Active Listening & Reflection Utilized.  Verbalization of Feelings Encouraged.  Emotional Support Provided. Feelings of Anxiety & Stress Validated. Symptoms of Loneliness & Depression Acknowledged. Crisis Support Information, Agencies, Services, & Resources Discussed. Problem Solving Interventions Identified. Task-Centered Solutions Implemented.   Solution-Focused Strategies Developed. Acceptance & Commitment Therapy Introduced. Brief Cognitive Behavioral Therapy Initiated. Client-Centered Therapy Enacted. Reviewed Prescription Medications & Discussed Importance of Compliance. Encouraged Administration of Medications Exactly as Prescribed. Quality of Sleep Assessed & Sleep Hygiene Techniques Promoted. Encouraged Daily Implementation of Deep Breathing Exercises, Relaxation Techniques, & Mindfulness Meditation Strategies. Encouraged Increased Level of Activity & Exercise, as Tolerated. Encouraged Engagement in Activities of Interest, Inside & Outside the Home. Encouraged Journaling, As a Means to Express Thoughts, Feelings, & Emotions. Encouraged Self-Enrollment with Psychiatrist of Interest in [facility], from  List Provided, to Receive Psychotropic Medication Administration & Management, in An Effort to Reduce & Manage Symptoms of Anxiety & Depression. Encouraged Self-Enrollment with Therapist of Interest in [facility], from List Provided, to Receive Psychotherapeutic Counseling & Supportive Services, in An Effort to Reduce & Manage Symptoms of Anxiety & Depression. Confirmed Receipt & Encouraged Review of The Following List of [employer], Services, Resources, Occupational psychologist, Emailed on 05/03/2023: ~ Advertising account executive ~ TEFL teacher ~ Emergency Assistance Programs ~ Corporate investment banker Programs in [place] Encouraged Engagement with [employer], Nurse, adult, Field seismologist of Interest in [place], from List Provided, in An Effort to [employer] with [employer] Encouraged Completion of Applications for [employer] Program & Energy Programs of Interest in [place], from List Provided, in An Effort to [employer] with Rent & Utilities, Notifying CSW if You Need Assistance with Application Completion & Submission. Confirmed Receipt & Encouraged Review of The Following List of [employer], [employer], Resources, Occupational psychologist, Emailed on 05/03/2023:          ~ Low Income Housing in [place], [place] ~ Low Income Housing in [place], [place] ~ Housing Resources ~ [employer] Housing Resources ~ [employer] Housing Resources ~ Affordable Apartments & Rental Properties  ~ [employer] Independent Living Program Flyer ~ Section 8 Housing Application Encouraged Engagement with [employer], Nurse, adult, & Resources of Interest in [place] [employer], from List Provided, in An Effort to [employer], Affordable, Handicapped Accessible, Low-Income Housing. Encouraged Completion of Applications for Low-Income, Affordable, & Section 8 Housing of  Interest in [facility], from List Provided, in An Effort to [employer], Affordable, Handicapped Accessible, Low-Income Housing, Notifying CSW if [place] Need Assistance with Application Completion & Submission. Confirmed Active Adult Medicaid Coverage & Benefits, through The [facility] of Social Services [phone]). Confirmed Active Food Stamps Recipient, in The Amount of $220.00 Per Month, through The [facility] of [facility] [phone]). Confirmed Approval for Social Security Disability, in The Amount of $943.00 Per Month, through [employer] [phone]). Encouraged Engagement with Danford Bad, Social Work Case Manager with [facility] [phone]), if You Have Questions, Need Assistance, or If Additional Social Work Needs Are Identified Between Now & Our Next Follow-Up Outreach Call, Scheduled on  06/02/2023 at 2:15 PM. Encouraged Attendance at Follow-Up Appointment with Dr. Lilyan Punt, Primary Care Provider with [facility] [phone]), Scheduled on 08/24/2023 at 10:20 AM.      SDOH assessments and interventions completed:  Yes.  Care Coordination Interventions:  Yes, provided.   Follow up plan: Follow up call scheduled for 06/02/2023 at 2:15 pm.  Encounter Outcome:  Patient Visit Completed.   Danford Bad, BSW, MSW, Printmaker Social Work Case Set designer Health  [facility], Population Health Direct Dial: [phone]  Fax: [phone] Email: [email or web address]: [email or web address]

## 2023-05-11 ENCOUNTER — Emergency Department (HOSPITAL_COMMUNITY)
Admission: EM | Admit: 2023-05-11 | Discharge: 2023-05-12 | Discharge status: Against Medical Advice | Payer: MEDICAID | Source: Home / Self Care

## 2023-05-17 ENCOUNTER — Emergency Department (HOSPITAL_COMMUNITY): Payer: MEDICAID

## 2023-05-17 ENCOUNTER — Emergency Department (HOSPITAL_COMMUNITY)
Admission: EM | Admit: 2023-05-17 | Discharge: 2023-05-17 | Disposition: A | Discharge status: Home / Self Care | Payer: MEDICAID | Attending: Emergency Medicine | Admitting: Emergency Medicine

## 2023-05-17 ENCOUNTER — Other Ambulatory Visit: Payer: Self-pay

## 2023-05-17 DIAGNOSIS — Z1152 Encounter for screening for COVID-19: Secondary | ICD-10-CM | POA: Insufficient documentation

## 2023-05-17 DIAGNOSIS — F840 Autistic disorder: Secondary | ICD-10-CM | POA: Diagnosis not present

## 2023-05-17 DIAGNOSIS — J069 Acute upper respiratory infection, unspecified: Secondary | ICD-10-CM | POA: Insufficient documentation

## 2023-05-17 DIAGNOSIS — E119 Type 2 diabetes mellitus without complications: Secondary | ICD-10-CM | POA: Insufficient documentation

## 2023-05-17 DIAGNOSIS — B353 Tinea pedis: Secondary | ICD-10-CM | POA: Insufficient documentation

## 2023-05-17 DIAGNOSIS — Z7984 Long term (current) use of oral hypoglycemic drugs: Secondary | ICD-10-CM | POA: Diagnosis not present

## 2023-05-17 DIAGNOSIS — R0602 Shortness of breath: Secondary | ICD-10-CM | POA: Diagnosis present

## 2023-05-17 LAB — RESP PANEL BY RT-PCR (RSV, FLU A&B, COVID)  RVPGX2
Influenza A by PCR: NEGATIVE
Influenza B by PCR: NEGATIVE
Resp Syncytial Virus by PCR: NEGATIVE
SARS Coronavirus 2 by RT PCR: NEGATIVE

## 2023-05-17 LAB — CBG MONITORING, ED: Glucose-Capillary: 110 mg/dL — ABNORMAL HIGH (ref 70–99)

## 2023-05-17 MED ORDER — BENZONATATE 200 MG PO CAPS: 200.0000 mg | ORAL_CAPSULE | Freq: Three times a day (TID) | ORAL | 0 refills | Status: DC | PRN

## 2023-05-17 MED ORDER — ALBUTEROL SULFATE HFA 108 (90 BASE) MCG/ACT IN AERS
2.0000 | INHALATION_SPRAY | Freq: Once | RESPIRATORY_TRACT | Status: AC
  Administered 2023-05-17: 2 via RESPIRATORY_TRACT
  Filled 2023-05-17: qty 6.7

## 2023-05-17 MED ORDER — CLOTRIMAZOLE 1 % EX CREA: TOPICAL_CREAM | CUTANEOUS | 0 refills | Status: DC

## 2023-05-17 NOTE — ED Triage Notes (Signed)
Pt also complaining of 5th toes pain on the right foot.

## 2023-05-17 NOTE — ED Provider Notes (Signed)
[place] EMERGENCY DEPARTMENT AT [facility] Provider Note   CSN: [id number] Arrival date & time: 05/17/23  1346     History  Chief Complaint  Patient presents with   Shortness of Breath    Elijah [name] is a 27 y.o. male.   Shortness of Breath Associated symptoms: cough, sore throat and wheezing   Associated symptoms: no chest pain, no fever, no headaches and no neck pain        Elijah [name] is a 27 y.o. male with past medical history of ADD, autism spectrum disorder, intellectual disability, and diabetes who presents to the Emergency Department complaining of cough, nasal congestion, body aches, sore throat, feels like he is wheezing.  No history of asthma. Symptoms began one day ago.  Has been sleeping in his car.  Cough non productive.  No known fever.  Denies sick contacts, chest pain and shortness of breath.  He also requests to have his blood sugar checked.  Has not checked his blood sugar in the last few days.  He also complains of burning and itching to his right fifth toe.  No injury or open wounds  Home Medications Prior to Admission medications   Medication Sig Start Date End Date Taking? Authorizing Provider  albuterol (VENTOLIN HFA) 108 (90 Base) MCG/ACT inhaler Inhale 2 puffs into the lungs every 4 (four) hours as needed for wheezing or shortness of breath. 09/14/21   Babs Sciara, MD  metFORMIN (GLUCOPHAGE) 500 MG tablet TAKE ONE TABLET BY MOUTH EVERY MORNING WITH FOOD 11/22/22   Babs Sciara, MD  sodium chloride (OCEAN) 0.65 % SOLN nasal spray Place 1 spray into both nostrils as needed for congestion. 06/27/22   Valentino Nose, NP      Allergies    Patient has no known allergies.    Review of Systems   Review of Systems  Constitutional:  Negative for appetite change, chills and fever.  HENT:  Positive for congestion, sneezing and sore throat.   Respiratory:  Positive for cough, shortness of breath and wheezing.    Cardiovascular:  Negative for chest pain.  Genitourinary:  Negative for dysuria.  Musculoskeletal:  Positive for myalgias. Negative for arthralgias, neck pain and neck stiffness.  Skin:  Positive for color change (right fifth toe). Negative for wound.  Neurological:  Negative for dizziness, syncope, weakness and headaches.    Physical Exam Updated Vital Signs BP (!) 109/59   Pulse 93   Temp 98.6 F (37 C) (Oral)   Resp 18   Ht 5\' 5"  (1.651 m)   Wt 103 kg   SpO2 96%   BMI 37.77 kg/m  Physical Exam Vitals and nursing note reviewed.  Constitutional:      General: He is not in acute distress.    Appearance: Normal appearance. He is well-developed. He is not ill-appearing or toxic-appearing.  HENT:     Right Ear: Tympanic membrane and ear canal normal.     Left Ear: Tympanic membrane and ear canal normal.     Mouth/Throat:     Mouth: Mucous membranes are moist.     Pharynx: Oropharynx is clear. No oropharyngeal exudate or posterior oropharyngeal erythema.  Neck:     Meningeal: Kernig's sign absent.  Cardiovascular:     Rate and Rhythm: Normal rate and regular rhythm.     Pulses: Normal pulses.  Pulmonary:     Effort: Pulmonary effort is normal.     Breath sounds: No stridor. Wheezing  present. No rhonchi or rales.     Comments: Mild scattered expiratory wheezing.  No increased work of breathing Musculoskeletal:        General: No signs of injury. Normal range of motion.     Cervical back: Full passive range of motion without pain and normal range of motion.  Skin:    General: Skin is warm.     Capillary Refill: Capillary refill takes less than 2 seconds.     Comments: Peeling skin and mild erythema of the dorsal right toe and web space of the fourth and fifth toes.  No lymphangitis or open wounds.    Neurological:     General: No focal deficit present.     Mental Status: He is alert.     Sensory: No sensory deficit.     Motor: No weakness.     ED Results / Procedures  / Treatments   Labs (all labs ordered are listed, but only abnormal results are displayed) Labs Reviewed  CBG MONITORING, ED - Abnormal; Notable for the following components:      Result Value   Glucose-Capillary 110 (*)    All other components within normal limits  RESP PANEL BY RT-PCR (RSV, FLU A&B, COVID)  RVPGX2    EKG None  Radiology DG Chest 1 View  Result Date: 05/17/2023 CLINICAL DATA:  Shortness of breath. EXAM: CHEST  1 VIEW COMPARISON:  01/09/2019. FINDINGS: Bilateral lung fields are clear. Bilateral costophrenic angles are clear. Normal cardio-mediastinal silhouette. No acute osseous abnormalities. The soft tissues are within normal limits. IMPRESSION: No active disease. Electronically Signed   By: Jules Schick M.D.   On: 05/17/2023 16:20    Procedures Procedures    Medications Ordered in ED Medications  albuterol (VENTOLIN HFA) 108 (90 Base) MCG/ACT inhaler 2 puff (has no administration in time range)    ED Course/ Medical Decision Making/ A&P                                 Medical Decision Making Patient here for evaluation of cough, nasal congestion, sore throat and wheezing.  Symptoms began yesterday.  States he has been sleeping in his car.  No known fever he also endorses history of diabetes and has not checked his blood sugar in several days and is requesting to have it checked.  Denies any increased thirst or urination.  No itching or redness of his right fifth toe.  No known injury.   Clinically, patient well-appearing nontoxic.  Few scattered expiratory wheezes but mild.  No increased work of breathing on exam vital signs are reassuring.  Low clinical suspicion for COVID but suspect viral process.  Patient is symptoms of his right fifth toe appear consistent with tinea.  No open wound or concerning symptoms for cellulitis.    Amount and/or Complexity of Data Reviewed Labs: ordered.    Details: Respir panel negative.  Cbg 110 Radiology: ordered.     Details: CXR w/o acute cardiopulmonary findings.  Agree with radiology intrepretation Discussion of management or test interpretation with external provider(s): Albuterol MDI given here with 2 puffs.  Will be dispensed for home use.  Agreeable to symptomatic treatment.  Prescription written for clotrimazole and tessalon  Risk Prescription drug management.           Final Clinical Impression(s) / ED Diagnoses Final diagnoses:  Viral URI with cough  Tinea pedis of right foot  Rx / DC Orders ED Discharge Orders     None         Pauline Aus, PA-C 05/17/23 1641    Gloris Manchester, MD 05/21/23 2348

## 2023-05-17 NOTE — Discharge Instructions (Signed)
Your COVID flu and RSV test was negative.  Use the inhaler, 1 to 2 puffs every 4-6 hours as needed.  You been prescribed medication to help with your cough as well.  You may use Tylenol if needed for body aches or fever.  Drink plenty of fluids.  I have also prescribed a cream for you to use on your foot.  Try to keep your foot clean and dry change your socks frequently

## 2023-05-17 NOTE — ED Triage Notes (Signed)
Pt states that he is having trouble breathing that started yesterday and it has progressively gotten worse throughout the night and day. Pt feels like he is wheezing. Pt without wheezing at this time. Pt states he smokes cigarettes.

## 2023-06-02 ENCOUNTER — Ambulatory Visit: Payer: Self-pay | Admitting: *Deleted

## 2023-06-02 NOTE — Patient Outreach (Signed)
Care Coordination   Follow Up Visit Note   06/02/2023  Name: Elijah [name] MRN: [id number] DOB: [birth year]  Elijah [name] is a 27 y.o. year old male who sees Luking, Jonna Coup, MD for primary care. I spoke with [name] by phone today.  What matters to the patients health and wellness today?  Receive [employer], Services, Resources & Referrals.    Goals Addressed               This Visit's Progress     Receive [employer], Services, Resources & Referrals. 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, Attentio Deficit Disorder]  General Interventions   General Interventions Discussed/Reviewed General Interventions Discussed, Labs, Vaccines, Doctor Visits, General Interventions Reviewed, Annual Eye Exam, Annual Foot Exam, Health Screening, Communication with, Level of Care, Community Resources  [Encouraged Routine Medical Management]  Labs Hgb A1c every 3 months, Kidney Function  [Encouraged Routine Labs]  Vaccines COVID-19, Flu, Pneumonia, RSV, Shingles, Tetanus/Pertussis/Diphtheria  [Encouraged Immunizations & Vaccinations]  Doctor Visits Discussed/Reviewed Doctor Visits Discussed, Specialist, Doctor Visits Reviewed, Annual Wellness Visits, PCP  [Encouraged Attendance]  Health Screening Colonoscopy, Prostate  [Encouraged Routine Health Screenings]  PCP/Specialist Visits Compliance with follow-up visit  [Encouraged]  Communication with PCP/Specialists  [Collaboration with Primary Care Provider to Report Findings of Assessment]  Level of Care Applications, Assisted Living  [place], Provided, & Encouraged  Completion & Consideration]  Applications Medicaid, Other  [Confirmed Active Medicaid Coverage]  Exercise Interventions   Exercise Discussed/Reviewed Exercise Discussed, Exercise Reviewed, Physical Activity, Weight Managment  [Encouraged Increased Level of Activity & Exercise, as Tolerated & Encouraged Weight Loss & Health Diet]  Physical Activity Discussed/Reviewed Physical Activity Discussed, Home Exercise Program (HEP), PREP, Gym, Types of exercise, Physical Activity Reviewed  [Encouraged]  Weight Management Weight loss  [Encouraged]  Education Interventions   Education Provided Provided Therapist, sports, Provided Web-based Education, Provided Education  [employer], Confirmed Receipt, & Thoroughly Reviewed to [employer] Understanding]  Provided Verbal Education On Nutrition, Mental Health/Coping with Illness, When to see the doctor, Foot Care, Eye Care, Labs, Applications, Exercise, Medication, Development worker, community, [employer] Resources  [Encouraged]  Labs Reviewed --  [N/A]  Applications Medicaid, Other  [Confirmed Active Medicaid Coverage]  Mental Health Interventions   Mental Health Discussed/Reviewed Mental Health Discussed, Anxiety, Depression, Grief and Loss, Mental Health Reviewed, Coping Strategies, Substance Abuse, Suicide, Crisis, Other  [Domestic Violence - None Present, Reviewed Positive Coping Mechanisms & Encouraged Daily Implementation]  Nutrition Interventions   Nutrition Discussed/Reviewed Nutrition Discussed, Adding fruits and vegetables, Increasing proteins, Decreasing fats, Nutrition Reviewed, Fluid intake, Decreasing salt, Portion sizes, Carbohydrate meal planning, Decreasing sugar intake, Supplemental nutrition  [Encouraged]  Pharmacy Interventions   Pharmacy Dicussed/Reviewed Pharmacy Topics Discussed, Medications and their functions, Medication Adherence, Pharmacy Topics Reviewed, Affording Medications  [Encouraged Administation of Medications Exactly as Prescribed]   Medication Adherence --  [N/A]  Safety Interventions   Safety Discussed/Reviewed Safety Discussed, Safety Reviewed  [Discussed Safety Techniques & Encouraged Implementation]  Advanced Directive Interventions   Advanced Directives Discussed/Reviewed Advanced Directives Discussed  [Encouraged Completion]      Active Listening & Reflection Utilized.  Verbalization of Feelings Encouraged.  Emotional Support Provided. Problem Solving  Interventions Activated. Task-Centered Solutions Employed.   Solution-Focused Strategies Implemented. Acceptance & Commitment Therapy Initiated. Cognitive Behavioral Therapy Performed. Client-Centered Therapy Conducted. Encouraged Self-Enrollment with Psychiatrist of Interest in [facility], from List Provided, to Receive Psychotropic Medication Administration & Management, in An Effort to Reduce & Manage Symptoms of Anxiety & Depression. Encouraged Self-Enrollment with Therapist of Interest in [facility], from List Provided, to Receive Psychotherapeutic Counseling & Supportive Services, in An Effort to Reduce & Manage Symptoms of Anxiety & Depression. Thoroughly Reviewed & Encouraged Initiation of Applications to [employer] with Rent & Utilities, Offering Assistance with Application Completion & Submission, from List Provided:  ~ Advertising account executive ~ Web designer ~ Engineer, civil (consulting) Reviewed & Encouraged Engagement with Neurosurgeon of Interest in [place], Offering Assistance with Referral Process, from List Provided: ~ Neurosurgeon in [place], [place] Thoroughly Reviewed, Encouraged Engagement & Initiation of Applications to [employer] with Housing, Offering Assistance with Application Completion & Submission, as Well as Referral Process, from List Provided:           ~ Low Income Housing in  [place], [place] ~ Low Income Housing in [place], [place] ~ Housing Resources ~ [employer] Housing Resources ~ [employer] Housing Resources ~ Affordable Apartments & Rental Properties  ~ [place] Independent Living Program Flyer ~ Section 8 Housing Application Encouraged Engagement with Danford Bad, Social Work Case Production designer, theatre/television/film with [employer] [phone]), if You Have Questions, Need Assistance, or If Additional Social Work Needs Are Identified Between Now & Our Next Follow-Up Outreach Call, Scheduled on 06/14/2023 at 2:30 PM.      SDOH assessments and interventions completed:  Yes.  Care Coordination Interventions:  Yes, provided.   Follow up plan: Follow up call scheduled for 06/14/2023 at 2:30 pm.  Encounter Outcome:  Patient Visit Completed.   Danford Bad, BSW, MSW, Printmaker Social Work Case Set designer Health  [facility], Population Health Direct Dial: [phone]  Fax: [phone] Email: [email or web address]: [email or web address]

## 2023-06-02 NOTE — Patient Instructions (Signed)
Visit Information  Thank you for taking time to visit with me today. Please don't hesitate to contact me if I can be of assistance to you.   Following are the goals we discussed today:   Goals Addressed               This Visit's Progress     Receive [employer], Services, Resources & Referrals. 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, Attentio Deficit Disorder]  General Interventions   General Interventions Discussed/Reviewed General Interventions Discussed, Labs, Vaccines, Doctor Visits, General Interventions Reviewed, Annual Eye Exam, Annual Foot Exam, Health Screening, Communication with, Level of Care, Community Resources  [Encouraged Routine Medical Management]  Labs Hgb A1c every 3 months, Kidney Function  [Encouraged Routine Labs]  Vaccines COVID-19, Flu, Pneumonia, RSV, Shingles, Tetanus/Pertussis/Diphtheria  [Encouraged Immunizations & Vaccinations]  Doctor Visits Discussed/Reviewed Doctor Visits Discussed, Specialist, Doctor Visits Reviewed, Annual Wellness Visits, PCP  [Encouraged Attendance]  Health Screening Colonoscopy, Prostate  [Encouraged Routine Health Screenings]  PCP/Specialist Visits Compliance with follow-up visit  [Encouraged]  Communication with PCP/Specialists  [Collaboration with Primary Care Provider to Report Findings of Assessment]  Level of Care Applications, Assisted Living  [place], Provided, & Encouraged Completion & Consideration]  Applications Medicaid, Other  [Confirmed Active Medicaid Coverage]  Exercise Interventions   Exercise Discussed/Reviewed Exercise Discussed, Exercise Reviewed,  Physical Activity, Weight Managment  [Encouraged Increased Level of Activity & Exercise, as Tolerated & Encouraged Weight Loss & Health Diet]  Physical Activity Discussed/Reviewed Physical Activity Discussed, Home Exercise Program (HEP), PREP, Gym, Types of exercise, Physical Activity Reviewed  [Encouraged]  Weight Management Weight loss  [Encouraged]  Education Interventions   Education Provided Provided Therapist, sports, Provided Web-based Education, Provided Education  [employer], Confirmed Receipt, & Thoroughly Reviewed to [employer] Understanding]  Provided Verbal Education On Nutrition, Mental Health/Coping with Illness, When to see the doctor, Foot Care, Eye Care, Labs, Applications, Exercise, Medication, Development worker, community, [employer] Resources  [Encouraged]  Labs Reviewed --  [N/A]  Applications Medicaid, Other  [Confirmed Active Medicaid Coverage]  Mental Health Interventions   Mental Health Discussed/Reviewed Mental Health Discussed, Anxiety, Depression, Grief and Loss, Mental Health Reviewed, Coping Strategies, Substance Abuse, Suicide, Crisis, Other  [Domestic Violence - None Present, Reviewed Positive Coping Mechanisms & Encouraged Daily Implementation]  Nutrition Interventions   Nutrition Discussed/Reviewed Nutrition Discussed, Adding fruits and vegetables, Increasing proteins, Decreasing fats, Nutrition Reviewed, Fluid intake, Decreasing salt, Portion sizes, Carbohydrate meal planning, Decreasing sugar intake, Supplemental nutrition  [Encouraged]  Pharmacy Interventions   Pharmacy Dicussed/Reviewed Pharmacy Topics Discussed, Medications and their functions, Medication Adherence, Pharmacy Topics Reviewed, Affording Medications  [Encouraged Administation of Medications Exactly as Prescribed]  Medication Adherence --  [N/A]  Safety Interventions   Safety Discussed/Reviewed Safety Discussed, Safety Reviewed  [Discussed Safety Techniques & Encouraged Implementation]  Advanced Directive  Interventions   Advanced Directives Discussed/Reviewed Advanced Directives Discussed  [Encouraged Completion]      Active Listening & Reflection Utilized.  Verbalization of Feelings Encouraged.  Emotional Support Provided. Problem Solving Interventions Activated. Task-Centered Solutions Employed.   Solution-Focused Strategies Implemented. Acceptance & Commitment Therapy Initiated. Cognitive Behavioral Therapy Performed. Client-Centered Therapy Conducted. Encouraged Self-Enrollment with Psychiatrist of Interest in [place], from  List Provided, to Receive Psychotropic Medication Administration & Management, in An Effort to Reduce & Manage Symptoms of Anxiety & Depression. Encouraged Self-Enrollment with Therapist of Interest in [facility], from List Provided, to Receive Psychotherapeutic Counseling & Supportive Services, in An Effort to Reduce & Manage Symptoms of Anxiety & Depression. Thoroughly Reviewed & Encouraged Initiation of Applications to [employer] with Rent & Utilities, Offering Assistance with Application Completion & Submission, from List Provided:  ~ Advertising account executive ~ Web designer ~ Engineer, civil (consulting) Reviewed & Encouraged Engagement with Neurosurgeon of Interest in [place], Offering Assistance with Referral Process, from List Provided: ~ Neurosurgeon in [place], [place] Thoroughly Reviewed, Encouraged Engagement & Initiation of Applications to [employer] with Housing, Offering Assistance with Application Completion & Submission, as Well as Referral Process, from List Provided:           ~ Low Income Housing in [place], [place] ~ Low Income Housing in [place], [place] ~ Housing Resources ~ [employer] Housing Resources ~ [employer] Housing Resources ~ Affordable Apartments & Rental Properties  ~ [place]  Independent Living Program Flyer ~ Section 8 Housing Application Encouraged Engagement with Danford Bad, Social Work Sports coach with [employer] [phone]), if You Have Questions, Need Assistance, or If Additional Social Work Needs Are Identified Between Now & Our Next Follow-Up Outreach Call, Scheduled on 06/14/2023 at 2:30 PM.      Our next appointment is by telephone on 06/14/2023 at 2:30 pm.  Please call the care guide team at [phone] if you need to cancel or reschedule your appointment.   If you are experiencing a Mental Health or Behavioral Health Crisis or need someone to talk to, please call the Suicide and Crisis Lifeline: 988 call the [place] National Suicide Prevention Lifeline: 904 168 6879 or TTY: 475-279-1543 TTY 706 745 6277) to talk to a trained counselor call 1-800-273-TALK (toll free, 24 hour hotline) go to [facility] [address] [place] [phone]) call the [facility] Crisis Line: [phone] call 911  Patient verbalizes understanding of instructions and care plan provided today and agrees to view in MyChart. Active MyChart status and patient understanding of how to access instructions and care plan via MyChart confirmed with patient.     Telephone follow up appointment with care management team member scheduled for:  06/14/2023 at 2:30 pm.  Danford Bad, BSW, MSW, LCSW  Embedded Practice Social Work Case Manager  [facility], Population Health Direct Dial: [phone]  Fax: [phone] Email: [email or web address] Website: [email or web address]

## 2023-06-14 ENCOUNTER — Ambulatory Visit: Payer: Self-pay | Admitting: *Deleted

## 2023-06-14 NOTE — Patient Instructions (Signed)
Visit Information  Thank you for taking time to visit with me today. Please don't hesitate to contact me if I can be of assistance to you.   Following are the goals we discussed today:   Goals Addressed               This Visit's Progress     Receive [employer], Services, Resources & Referrals. 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, Attentio Deficit Disorder, Seeking Part-Time Employment]  General Interventions   General Interventions Discussed/Reviewed General Interventions Discussed, General Interventions Reviewed, Durable Medical Equipment (DME), Community Resources, Communication with, Doctor Visits  [Communication with Care Team Members]  Doctor Visits Discussed/Reviewed Doctor Visits Discussed, Doctor Visits Reviewed, Annual Wellness Visits, PCP, Specialist  [Encouraged Routine Engagement]  PCP/Specialist Visits Compliance with follow-up visit  [Encouraged]  Communication with PCP/Specialists, Charity fundraiser, Pharmacists, Social Work  [Encouraged Routine Engagement]  Level of Care --  Therapist, occupational in Actuary in Adult Day Care Program or Receiving Assistance Pusuring Higher Level of Care Placement]  Applications --  [employer] Active [employer] Recipient,  Confirmed Disinterest in Applying for Personal Care Services]  Education Interventions   Education Provided Provided Education, Provided Therapist, sports, Provided Web-based Education  [employer] Vocational Rehabilitation Information]  Provided Verbal Education On Mental Health/Coping with Illness, When to see the doctor, Programmer, applications, Air traffic controller, Camera operator --  [employer] Active [employer] Recipient,  Confirmed Disinterest in Applying for Personal Care Services]  Mental Health Interventions   Mental Health Discussed/Reviewed Mental Health Discussed, Anxiety, Depression, Mental Health Reviewed, Grief and Loss, Substance Abuse, Coping Strategies, Suicide, Crisis  [Assessed Mental Health Status]  Safety Interventions   Safety Discussed/Reviewed Safety Discussed, Safety Reviewed  Advanced Directive Interventions   Advanced Directives Discussed/Reviewed Advanced Directives Discussed  [Encouraged Initiation, Offering to [employer] & Assist with Completion]       Active Listening & Reflection Utilized.  Verbalization of Feelings Encouraged.  Emotional Support Provided. Problem Solving Interventions Indicated. Solution-Focused Strategies Employed. Acceptance & Commitment Therapy Implemented. Cognitive Behavioral Therapy Initiated. Encouraged Initiation of Applications to [employer] with Rent & Utilities, Offering Assistance with Application Completion & Submission, from List Provided:  ~ Advertising account executive ~ Web designer ~ Psychologist, sport and exercise Engagement with Neurosurgeon of Interest in [place], Offering Assistance with Referral Process, from List Provided: ~ Neurosurgeon in [place], [place] Encouraged Engagement & Initiation of Applications to [employer] with Housing, Offering Assistance with Application Completion & Submission, as Well as Referral Process, from List Provided:           ~ Low Income Housing in [place], [place] ~ Low Income Housing in [place], [place] ~ Housing Resources ~ [employer] Housing Resources ~ [employer] Housing Resources ~ Affordable Apartments & Rental Properties  ~ [place] Independent Living Program Flyer ~ Section 8 Housing  Application Encouraged Routine Engagement with Danford Bad, Licensed Clinical Social Worker with [employer] [phone]), if You Have Questions, Need Assistance, or If Additional Social Work Needs Are Identified Between Now & Our Next Follow-Up Outreach Call, Scheduled on 07/12/2023 at 9:45 AM.  Our next appointment is by telephone on 07/12/2023 at 9:45 am.   Please call the care guide team at [phone] if you need to cancel or reschedule your appointment.   If you are experiencing a Mental Health or Behavioral Health Crisis or need someone to talk to, please call the Suicide and Crisis Lifeline: 988 call the [place] National Suicide Prevention Lifeline: 303 160 5769 or TTY: 272-802-2303 TTY 430-815-1828) to talk to a trained counselor call 1-800-273-TALK (toll free, 24 hour hotline) go to [facility] [address] [place] [phone]) call the [facility] Crisis Line: [phone] call 911  Patient verbalizes understanding of instructions and care plan provided today and agrees to view in MyChart. Active MyChart status and patient understanding of how to access instructions and care plan via MyChart confirmed with patient.     Telephone follow up appointment with care management team member scheduled for:  07/12/2023 at 9:45 am.   Danford Bad, BSW, MSW, LCSW  Embedded Practice Social Work Case Manager  [facility], Population Health Direct Dial: [phone]  Fax: [phone] Email: [email or web address]: [email or web address]

## 2023-06-14 NOTE — Patient Outreach (Signed)
Care Coordination   Follow Up Visit Note   06/14/2023  Name: Elijah [name] MRN: [id number] DOB: [birth year]  Elijah [name] is a 27 y.o. year old male who sees Luking, Jonna Coup, MD for primary care. I spoke with [name] by phone today.  What matters to the patients health and wellness today?  Receive [employer], Services, Resources & Referrals.    Goals Addressed               This Visit's Progress     Receive [employer], Services, Resources & Referrals. 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, Attentio Deficit Disorder, Seeking Part-Time Employment]  General Interventions   General Interventions Discussed/Reviewed General Interventions Discussed, General Interventions Reviewed, Durable Medical Equipment (DME), Community Resources, Communication with, Doctor Visits  [Communication with Care Team Members]  Doctor Visits Discussed/Reviewed Doctor Visits Discussed, Doctor Visits Reviewed, Annual Wellness Visits, PCP, Specialist  [Encouraged Routine Engagement]  PCP/Specialist Visits Compliance with follow-up visit  [Encouraged]  Communication with PCP/Specialists, Charity fundraiser, Pharmacists, Social Work  [Encouraged Routine Engagement]  Level of Care --  Therapist, occupational in Actuary in Adult Day Care Program or Receiving Assistance Pusuring Higher Level of Care Placement]  Applications --  [employer] Active [employer] Recipient,  Confirmed Disinterest in Applying for Personal Care Services]  Education Interventions   Education Provided Provided Education, Provided Therapist, sports, Provided Web-based Education  [employer]  Vocational Rehabilitation Information]  Provided Verbal Education On Mental Health/Coping with Illness, When to see the doctor, Programmer, applications, Air traffic controller, Tree surgeon --  [employer] Active [employer] Recipient,  Confirmed Disinterest in Applying for Personal Care Services]  Mental Health Interventions   Mental Health Discussed/Reviewed Mental Health Discussed, Anxiety, Depression, Mental Health Reviewed, Grief and Loss, Substance Abuse, Coping Strategies, Suicide, Crisis  [Assessed Mental Health Status]  Safety Interventions   Safety Discussed/Reviewed Safety Discussed, Safety Reviewed  Advanced Directive Interventions   Advanced Directives Discussed/Reviewed Advanced Directives Discussed  [Encouraged Initiation, Offering to [employer] & Assist with Completion]       Active Listening & Reflection Utilized.  Verbalization of Feelings Encouraged.  Emotional Support Provided. Problem Solving Interventions Indicated. Solution-Focused Strategies Employed. Acceptance & Commitment Therapy Implemented. Cognitive Behavioral Therapy Initiated. Encouraged Initiation of Applications to [employer] with Rent & Utilities, Offering Assistance with Application Completion & Submission, from List Provided:  ~ Advertising account executive ~ Web designer ~ Psychologist, sport and exercise Engagement with Neurosurgeon of Interest in [place], Offering Assistance with Referral Process, from List Provided: ~ Neurosurgeon in [place], [place] Encouraged Engagement & Initiation of Applications to [employer] with Housing, Offering Assistance with Application Completion & Submission, as Well as Referral Process, from List Provided:                 ~ Low Income Housing in [place], [place] ~ Low Income Housing in [place], [place] ~ Housing Resources ~  [employer] Housing Resources ~ [employer] Housing Resources ~ Conservation officer, nature Properties  ~ [employer] Independent Living Program Flyer ~ Section 8 Housing Application Encouraged Routine Engagement with Danford Bad, Licensed Visual merchandiser  with [facility] [phone]), if You Have Questions, Need Assistance, or If Additional Social Work Needs Are Identified Between Now & Our Next Follow-Up Outreach Call, Scheduled on 07/12/2023 at 9:45 AM.      SDOH assessments and interventions completed:  Yes.  Care Coordination Interventions:  Yes, provided.   Follow up plan: Follow up call scheduled for 07/12/2023 at 9:45 am.   Encounter Outcome:  Patient Visit Completed.   Danford Bad, BSW, MSW, Printmaker Social Work Case Set designer Health  [facility], Population Health Direct Dial: [phone]  Fax: [phone] Email: [email or web address]: [email or web address]

## 2023-07-12 ENCOUNTER — Ambulatory Visit: Payer: Self-pay | Admitting: *Deleted

## 2023-07-12 NOTE — Patient Instructions (Signed)
Visit Information  Thank you for taking time to visit with me today. Please don't hesitate to contact me if I can be of assistance to you.   Following are the goals we discussed today:   Goals Addressed               This Visit's Progress     Receive [employer], Services, Resources & Referrals. 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, Attentio Deficit Disorder, Seeking Part-Time Employment.]  General Interventions   General Interventions Discussed/Reviewed General Interventions Discussed, Doctor Visits, Communication with, [employer], General Interventions Reviewed, Level of Care, Durable Medical Equipment (DME), Health Screening, Vaccines  [Encouraged Routine Engagement with Care Team Members.]  Vaccines COVID-19, Flu, Pneumonia, RSV, Shingles, Tetanus/Pertussis/Diphtheria  [Encouraged Annual Vaccinations.]  Doctor Visits Discussed/Reviewed Doctor Visits Discussed, Specialist, Doctor Visits Reviewed, Annual Wellness Visits, PCP  [Encouraged Routine Engagement with Care Team Members.]  Health Screening Colonoscopy, Prostate  [Encouraged Annual Health Screenings.]  Durable Medical Equipment (DME) --  [None.]  PCP/Specialist Visits Compliance with follow-up visit  [Encouraged Routine Engagement with Care Team Members.]  Communication with PCP/Specialists, RN, Pharmacists, Social Work  [employer] Routine Engagement with Care Team Members.]  Level of Care Adult Daycare, Air traffic controller, Assisted Living, Skilled Nursing Facility  [Confirmed Disinterest in Enrollment in Adult Day Care Program or Receiving Assistance Pursuring Higher Level of Care  Placement Options (I.e Assisted Living Versus Rest Home).]  Applications Medicaid, Other, Personal Care Services  [facility] Active [place] Access Medicaid Recipient,  Confirmed Disinterest in Applying for Personal Care Services. Confirmed Background Check for Housing Completed on 07/12/2023.]  Exercise Interventions   Exercise Discussed/Reviewed Exercise Discussed, Assistive device use and maintanence, Exercise Reviewed, Physical Activity, Weight Managment  [Encouraged Increased Level of Activity & Exercise, as Tolerated & Encouraged Weight Loss & Healthy Diet.]  Physical Activity Discussed/Reviewed Physical Activity Discussed, Home Exercise Program (HEP), PREP, Physical Activity Reviewed, Gym, Types of exercise  [Encouraged Daily Exercise Regimen.]  Weight Management Weight loss  [Encouraged Healthy Diet & Exercise.]  Education Interventions   Education Provided Provided Education  [Thoroughly Reviewed Educational Material & Entertained Questions to Ensure Understanding.]  Provided Verbal Education On Nutrition, Mental Health/Coping with Illness, When to see the doctor, Medication, Programmer, applications, Air traffic controller, Exercise  [Encouraged Consideration & Implementation.]  Ship broker, Other, Personal Care Services  [employer] Active [employer] Recipient,  Confirmed Disinterest in Applying for [employer]. Confirmed Background Check for Housing Completed on 07/12/2023.]  Mental Health Interventions   Mental Health Discussed/Reviewed Mental Health Discussed, Depression, Anxiety, Mental Health Reviewed, Grief and Loss, Coping Strategies, Crisis, Other, Suicide, Substance Abuse  [Assessed Mental Health & Cognitive Status.]  Nutrition Interventions   Nutrition Discussed/Reviewed Nutrition Discussed, Adding fruits and vegetables, Increasing proteins, Decreasing fats, Decreasing salt, Portion sizes, Fluid intake, Nutrition Reviewed, Carbohydrate meal planning, Decreasing  sugar intake  [Encouraged Healthy Diet & Drink Cathleen Fears of Fluids.]  Pharmacy Interventions   Pharmacy Dicussed/Reviewed Pharmacy Topics Discussed, Medications and their functions, Pharmacy Topics Reviewed, Medication Adherence, Affording Medications  [Confirmed Ability to [employer] Prescription Medications.]  Medication Adherence --  [Confirmed Prescription Medication Compliance.]  Safety Interventions   Safety Discussed/Reviewed Safety Discussed, Safety Reviewed  Advanced Directive Interventions  Advanced Directives Discussed/Reviewed Advanced Directives Discussed, Advanced Directives Reviewed, Advanced Care Planning  [Encouraged Initiation of Advanced Directives (Living Will & Healthcare Power of Corporate treasurer), Offering to [employer], Assist with Completion & Scan Copies into Electronic Medical Record in Epic.]       Active Listening & Reflection Utilized.  Verbalization of Feelings Encouraged.  Emotional Support Provided. Acceptance & Commitment Therapy Indicated. Cognitive Behavioral Therapy Implemented. Client-Centered Therapy Performed. Encouraged Routine Engagement with Danford Bad, Licensed Clinical Social Worker with [facility] [phone]), if You Have Questions, Need Assistance, or If Additional Social Work Needs Are Identified Between Now & Our Next Follow-Up Outreach Call, Scheduled on 07/31/2023 at 9:45 AM.      Our next appointment is by telephone on 07/31/2023 at 9:45 am.  Please call the care guide team at [phone] if you need to cancel or reschedule your appointment.   If you are experiencing a Mental Health or Behavioral Health Crisis or need someone to talk to, please call the Suicide and Crisis Lifeline: 988 call the [place] National Suicide Prevention Lifeline: 856 162 7762 or TTY: (765)219-3580 TTY 702 134 4563) to talk to a trained counselor call 1-800-273-TALK (toll free, 24 hour hotline) go to [facility]  Urgent Care [address] [place] [phone]) call the [facility] Crisis Line: [phone] call 911  Patient verbalizes understanding of instructions and care plan provided today and agrees to view in MyChart. Active MyChart status and patient understanding of how to access instructions and care plan via MyChart confirmed with patient.     Telephone follow up appointment with care management team member scheduled for:  07/31/2023 at 9:45 am.  Danford Bad, BSW, MSW, LCSW  Embedded Practice Social Work Case Manager  [facility], Population Health Direct Dial: [phone]  Fax: [phone] Email: [email or web address] Website: [email or web address]

## 2023-07-12 NOTE — Patient Outreach (Signed)
Care Coordination   Follow Up Visit Note   07/12/2023  Name: Elijah [name] MRN: [id number] DOB: [birth year]  Elijah [name] is a 27 y.o. year old male who sees Luking, Jonna Coup, MD for primary care. I spoke with [name] by phone today.  What matters to the patients health and wellness today?  Receive [employer], Services, Resources & Referrals.    Goals Addressed               This Visit's Progress     Receive [employer], Services, Resources & Referrals. 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, Attentio Deficit Disorder, Seeking Part-Time Employment.]  General Interventions   General Interventions Discussed/Reviewed General Interventions Discussed, Doctor Visits, Communication with, [employer], General Interventions Reviewed, Level of Care, Durable Medical Equipment (DME), Health Screening, Vaccines  [Encouraged Routine Engagement with Care Team Members.]  Vaccines COVID-19, Flu, Pneumonia, RSV, Shingles, Tetanus/Pertussis/Diphtheria  [Encouraged Annual Vaccinations.]  Doctor Visits Discussed/Reviewed Doctor Visits Discussed, Specialist, Doctor Visits Reviewed, Annual Wellness Visits, PCP  [Encouraged Routine Engagement with Care Team Members.]  Health Screening Colonoscopy, Prostate  [Encouraged Annual Health Screenings.]  Durable Medical Equipment (DME) --  [None.]  PCP/Specialist Visits Compliance with follow-up visit  [Encouraged Routine Engagement with Care Team Members.]  Communication with PCP/Specialists, RN, Pharmacists, Social Work  [employer] Routine Engagement with Care Team Members.]  Level of Care  Adult Daycare, Air traffic controller, Assisted Living, Skilled Nursing Facility  [Confirmed Disinterest in Enrollment in Adult Day Care Program or Receiving Assistance Pursuring Higher Level of Care Placement Options (I.e Assisted Living Versus Rest Home).]  Applications Medicaid, Other, Personal Care Services  [facility] Active [place] Access Medicaid Recipient,  Confirmed Disinterest in Applying for Personal Care Services. Confirmed Background Check for Housing Completed on 07/12/2023.]  Exercise Interventions   Exercise Discussed/Reviewed Exercise Discussed, Assistive device use and maintanence, Exercise Reviewed, Physical Activity, Weight Managment  [Encouraged Increased Level of Activity & Exercise, as Tolerated & Encouraged Weight Loss & Healthy Diet.]  Physical Activity Discussed/Reviewed Physical Activity Discussed, Home Exercise Program (HEP), PREP, Physical Activity Reviewed, Gym, Types of exercise  [Encouraged Daily Exercise Regimen.]  Weight Management Weight loss  [Encouraged Healthy Diet & Exercise.]  Education Interventions   Education Provided Provided Education  [Thoroughly Reviewed Educational Material & Entertained Questions to Ensure Understanding.]  Provided Verbal Education On Nutrition, Mental Health/Coping with Illness, When to see the doctor, Medication, Programmer, applications, Air traffic controller, Exercise  [Encouraged Consideration & Implementation.]  Ship broker, Other, Personal Care Services  [employer] Active [employer] Recipient,  Confirmed Disinterest in Applying for [employer]. Confirmed Background Check for Housing Completed on 07/12/2023.]  Mental Health Interventions   Mental Health Discussed/Reviewed Mental Health Discussed, Depression, Anxiety, Mental Health Reviewed, Grief and Loss, Coping Strategies, Crisis, Other, Suicide, Substance Abuse  [Assessed Mental Health & Cognitive Status.]  Nutrition Interventions   Nutrition Discussed/Reviewed  Nutrition Discussed, Adding fruits and vegetables, Increasing proteins, Decreasing fats, Decreasing salt, Portion sizes, Fluid intake, Nutrition Reviewed, Carbohydrate meal planning, Decreasing sugar intake  [Encouraged Healthy Diet & Drink Cathleen Fears of Fluids.]  Pharmacy Interventions   Pharmacy Dicussed/Reviewed Pharmacy Topics Discussed, Medications and their functions, Pharmacy Topics Reviewed, Medication Adherence, Affording Medications  [  Confirmed Ability to Afford Prescription Medications.]  Medication Adherence --  [Confirmed Prescription Medication Compliance.]  Safety Interventions   Safety Discussed/Reviewed Safety Discussed, Safety Reviewed  Advanced Directive Interventions   Advanced Directives Discussed/Reviewed Advanced Directives Discussed, Advanced Directives Reviewed, Advanced Care Planning  [Encouraged Initiation of Advanced Directives (Living Will & Healthcare Power of Corporate treasurer), Offering to [employer], Assist with Completion & Scan Copies into Electronic Medical Record in Epic.]       Active Listening & Reflection Utilized.  Verbalization of Feelings Encouraged.  Emotional Support Provided. Acceptance & Commitment Therapy Indicated. Cognitive Behavioral Therapy Implemented. Client-Centered Therapy Performed. Encouraged Routine Engagement with Danford Bad, Licensed Clinical Social Worker with [facility] [phone]), if You Have Questions, Need Assistance, or If Additional Social Work Needs Are Identified Between Now & Our Next Follow-Up Outreach Call, Scheduled on 07/31/2023 at 9:45 AM.      SDOH assessments and interventions completed:  Yes.  Care Coordination Interventions:  Yes, provided.   Follow up plan: Follow up call scheduled for 07/31/2023 at 9:45 am.  Encounter Outcome:  Patient Visit Completed.   Danford Bad, BSW, MSW, Printmaker Social Work Case Set designer Health  [facility],  Population Health Direct Dial: [phone]  Fax: [phone] Email: [email or web address]

## 2023-07-25 ENCOUNTER — Other Ambulatory Visit: Payer: Self-pay

## 2023-07-25 ENCOUNTER — Encounter: Payer: Self-pay | Admitting: Emergency Medicine

## 2023-07-25 ENCOUNTER — Emergency Department
Admission: EM | Admit: 2023-07-25 | Discharge: 2023-07-25 | Disposition: A | Discharge status: Home / Self Care | Payer: MEDICAID | Attending: Emergency Medicine | Admitting: Emergency Medicine

## 2023-07-25 DIAGNOSIS — H9202 Otalgia, left ear: Secondary | ICD-10-CM | POA: Diagnosis present

## 2023-07-25 DIAGNOSIS — H65192 Other acute nonsuppurative otitis media, left ear: Secondary | ICD-10-CM | POA: Diagnosis not present

## 2023-07-25 DIAGNOSIS — Z20822 Contact with and (suspected) exposure to covid-19: Secondary | ICD-10-CM | POA: Diagnosis not present

## 2023-07-25 DIAGNOSIS — J029 Acute pharyngitis, unspecified: Secondary | ICD-10-CM | POA: Diagnosis not present

## 2023-07-25 DIAGNOSIS — H6592 Unspecified nonsuppurative otitis media, left ear: Secondary | ICD-10-CM

## 2023-07-25 HISTORY — DX: Type 2 diabetes mellitus without complications: E11.9

## 2023-07-25 LAB — RESP PANEL BY RT-PCR (RSV, FLU A&B, COVID)  RVPGX2
Influenza A by PCR: NEGATIVE
Influenza B by PCR: NEGATIVE
Resp Syncytial Virus by PCR: NEGATIVE
SARS Coronavirus 2 by RT PCR: NEGATIVE

## 2023-07-25 MED ORDER — AMOXICILLIN 500 MG PO TABS: 500.0000 mg | ORAL_TABLET | Freq: Two times a day (BID) | ORAL | 0 refills | Status: DC

## 2023-07-25 NOTE — Discharge Instructions (Signed)
Take Tylenol 1 g every 8 hours, ibuprofen 600 every 6 hours with food for the next 1 week to help with symptoms. Take antibiotics for your ear infection.

## 2023-07-25 NOTE — ED Triage Notes (Signed)
Patient to ED via POV for congestion, cough, runny nose, body aches and bilateral ear pain. Ongoing since Saturday.

## 2023-07-25 NOTE — ED Provider Notes (Addendum)
 [facility] Provider Note    Event Date/Time   First MD Initiated Contact with Patient 07/25/23 1908     (approximate)   History   Nasal Congestion   HPI  Elijah [name] is a 27 y.o. maleautism spectrum disorder, intellectual disability  otherwise healthy comes in with concerns for nasal congestion, sore throat, cough, ear pain.  Reports that he does have other people that feel similarly.   Physical Exam   Triage Vital Signs: ED Triage Vitals [07/25/23 1817]  Encounter Vitals Group     BP (!) 122/107     Systolic BP Percentile      Diastolic BP Percentile      Pulse Rate 100     Resp 18     Temp 98.3 F (36.8 C)     Temp Source Oral     SpO2 98 %     Weight 233 lb (105.7 kg)     Height 5' 5 (1.651 m)     Head Circumference      Peak Flow      Pain Score 10     Pain Loc      Pain Education      Exclude from Growth Chart     Most recent vital signs: Vitals:   07/25/23 1817  BP: (!) 122/107  Pulse: 100  Resp: 18  Temp: 98.3 F (36.8 C)  SpO2: 98%     General: Awake, no distress.  CV:  Good peripheral perfusion.  Resp:  Normal effort. Clear lungs  Abd:  No distention.  Other:  OP appears clear.  TMs are red and on the left side it seems purulent with some bulging of the TM   ED Results / Procedures / Treatments   Labs (all labs ordered are listed, but only abnormal results are displayed) Labs Reviewed  RESP PANEL BY RT-PCR (RSV, FLU A&B, COVID)  [id number]       PROCEDURES:  Critical Care performed: No  Procedures   MEDICATIONS ORDERED IN ED: Medications - No data to display   IMPRESSION / MDM / ASSESSMENT AND PLAN / ED COURSE  I reviewed the triage vital signs and the nursing notes.   Patient's presentation is most consistent with acute, uncomplicated illness.   Patient comes in with multiple symptoms suspect most likely viral in nature, COVID, flu, RSV are still pending.  Patient does look like he might  have otitis media on examination.  Patient will be scribed some antibiotics for this.  Patient's would like to wait for his COVID and flu test come back.  Patient handed off to [facility] pending these results but suspect discharge home.    FINAL CLINICAL IMPRESSION(S) / ED DIAGNOSES   Final diagnoses:  Other nonsuppurative otitis media of left ear, unspecified chronicity     Rx / DC Orders   ED Discharge Orders          Ordered    amoxicillin  (AMOXIL ) 500 MG tablet  2 times daily,   Status:  Discontinued        07/25/23 1913    amoxicillin  (AMOXIL ) 500 MG tablet  2 times daily        07/25/23 1914             Note:  This document was prepared using Dragon voice recognition software and may include unintentional dictation errors.   Ernest Ronal BRAVO, MD 07/25/23 LLEWELLYN    Ernest Ronal BRAVO, MD 07/25/23 [phone]

## 2023-07-31 ENCOUNTER — Ambulatory Visit: Payer: Self-pay | Admitting: Family Medicine

## 2023-07-31 ENCOUNTER — Ambulatory Visit: Payer: Self-pay | Admitting: *Deleted

## 2023-07-31 ENCOUNTER — Encounter: Payer: Self-pay | Admitting: *Deleted

## 2023-07-31 NOTE — Patient Instructions (Signed)
 Visit Information  Thank you for taking time to visit with me today. Please don't hesitate to contact me if I can be of assistance to you.   Following are the goals we discussed today:   Goals Addressed               This Visit's Progress     Receive [employer], Services, Resources & Referrals. 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, Attentio Deficit Disorder, Seeking Part-Time Employment.]  General Interventions   General Interventions Discussed/Reviewed General Interventions Discussed, Doctor Visits, Communication with, [facility], General Interventions Reviewed, Level of Care, Durable Medical Equipment (DME), Health Screening, Vaccines  [Encouraged Routine Engagement with Care Team Members.]  Vaccines COVID-19, Flu, Pneumonia, RSV, Shingles, Tetanus/Pertussis/Diphtheria  [Encouraged Annual Vaccinations.]  Doctor Visits Discussed/Reviewed Doctor Visits Discussed, Specialist, Doctor Visits Reviewed, Annual Wellness Visits, PCP  [Encouraged Routine Engagement with Care Team Members.]  Health Screening Colonoscopy, Prostate  [Encouraged Annual Health Screenings.]  Durable Medical Equipment (DME) --  [None.]  PCP/Specialist Visits Compliance with follow-up visit  [Encouraged Routine Engagement with Care Team Members.]  Communication with PCP/Specialists, RN, Pharmacists, Social Work  [employer] Routine Engagement with Care Team Members.]  Level of Care Adult Daycare, Air Traffic Controller, Assisted Living, Skilled Nursing Facility  [Confirmed Disinterest in Enrollment in Adult Day Care Program or Receiving Assistance Pursuring Higher Level of Care  Placement Options (I.e Assisted Living Versus Rest Home).]  Applications Medicaid, Other, Personal Care Services  [facility] Active [place] Access Medicaid Recipient,  Confirmed Disinterest in Applying for Personal Care Services. Confirmed Background Check for Housing Completed on 07/12/2023.]  Exercise Interventions   Exercise Discussed/Reviewed Exercise Discussed, Assistive device use and maintanence, Exercise Reviewed, Physical Activity, Weight Managment  [Encouraged Increased Level of Activity & Exercise, as Tolerated & Encouraged Weight Loss & Healthy Diet.]  Physical Activity Discussed/Reviewed Physical Activity Discussed, Home Exercise Program (HEP), PREP, Physical Activity Reviewed, Gym, Types of exercise  [Encouraged Daily Exercise Regimen.]  Weight Management Weight loss  [Encouraged Healthy Diet & Exercise.]  Education Interventions   Education Provided Provided Education  [Thoroughly Reviewed Educational Material & Entertained Questions to Ensure Understanding.]  Provided Verbal Education On Nutrition, Mental Health/Coping with Illness, When to see the doctor, Medication, Programmer, Applications, Air Traffic Controller, Exercise  [Encouraged Consideration & Implementation.]  Ship Broker, Other, Personal Care Services  [employer] Active [employer] Recipient,  Confirmed Disinterest in Applying for [employer]. Confirmed Background Check for Housing Completed on 07/12/2023.]  Mental Health Interventions   Mental Health Discussed/Reviewed Mental Health Discussed, Depression, Anxiety, Mental Health Reviewed, Grief and Loss, Coping Strategies, Crisis, Other, Suicide, Substance Abuse  [Assessed Mental Health & Cognitive Status.]  Nutrition Interventions   Nutrition Discussed/Reviewed Nutrition Discussed, Adding fruits and vegetables, Increasing proteins, Decreasing fats, Decreasing salt, Portion sizes, Fluid intake, Nutrition Reviewed, Carbohydrate meal planning, Decreasing  sugar intake  [Encouraged Healthy Diet & Drink Earline of Fluids.]  Pharmacy Interventions   Pharmacy Dicussed/Reviewed Pharmacy Topics Discussed, Medications and their functions, Pharmacy Topics Reviewed, Medication Adherence, Affording Medications  [Confirmed Ability to [employer] Prescription Medications.]  Medication Adherence --  [Confirmed Prescription Medication Compliance.]  Safety Interventions   Safety Discussed/Reviewed Safety Discussed, Safety Reviewed  Advanced Directive Interventions  Advanced Directives Discussed/Reviewed Advanced Directives Discussed, Advanced Directives Reviewed, Advanced Care Planning  [Encouraged Initiation of Advanced Directives (Living Will & Healthcare Power of Corporate Treasurer), Offering to [employer], Assist with Completion & Scan Copies into Electronic Medical Record in Epic.]       Active Listening & Reflection Utilized.  Verbalization of Feelings Encouraged.  Emotional Support Provided. Acceptance & Commitment Therapy Indicated. Cognitive Behavioral Therapy Implemented. Client-Centered Therapy Performed. CSW Collaboration with Receptionist at [facility] [phone]), Via [address] Call, to Assist with Scheduling Follow-Up Appointment for Patient with Primary Care Provider, Dr. Jacqulyn Ahle.    ~ Follow-Up Appointment Scheduled on 08/01/2023 at 11:20 AM. Encouraged Review & Engagement with [employer], [employer] of Interest, from List Provided, Emailed on 07/31/2023, Notifying CSW if Assistance is Needed with Referral Process:   ~ Hydrographic Surveyor.     ~ Art Gallery Manager.   ~ Chief Strategy Officer.   ~ Partners Ending Homelessness Pamphlet.   ~ Low-Income Housing Resources in [place], [place].   ~ [employer].   ~ Affordable Apartments & Rental Properties in [place]. Encouraged Initiation of Applications for [employer], from List Provided, Emailed on 07/31/2023,  Notifying CSW if Assistance if Needed with Application Completion & Submission:   ~ Counselling Psychologist.   ~ Section 8 Housing Application. Encouraged Routine Engagement with Philippe Desanctis, Licensed Clinical Social Worker with [facility] [phone]), if You Have Questions, Need Assistance, or If Additional Social Work Needs Are Identified Between Now & Our Next Follow-Up Outreach Call, Scheduled on 08/21/2023 at 2:00 PM.      Our next appointment is by telephone on 08/21/2023 at 2:00 pm.  Please call the care guide team at [phone] if you need to cancel or reschedule your appointment.   If you are experiencing a Mental Health or Behavioral Health Crisis or need someone to talk to, please call the Suicide and Crisis Lifeline: 988 call the [place]  National Suicide Prevention Lifeline: 3137143475 or TTY: 856-180-0058 TTY (217)445-7213) to talk to a trained counselor call 1-800-273-TALK (toll free, 24 hour hotline) go to [facility] [address], [place] [phone]) call the [facility] Crisis Line: [phone] call 911  Patient verbalizes understanding of instructions and care plan provided today and agrees to view in MyChart. Active MyChart status and patient understanding of how to access instructions and care plan via MyChart confirmed with patient.     Telephone follow up appointment with care management team member scheduled for:  08/21/2023 at 2:00 pm.  Maclean Foister, BSW, MSW, LCSW  Embedded Practice Social Work Case Manager  [facility], Population Health Direct Dial: [phone]  Fax: [phone] Email: [email or web address]: [email or web address]

## 2023-07-31 NOTE — Patient Outreach (Signed)
 Care Coordination   Follow Up Visit Note   07/31/2023  Name: Elijah [name] MRN: [id number] DOB: [birth year]  Elijah [name] is a 28 y.o. year old male who sees Luking, Glendia LABOR, MD for primary care. I spoke with Elijah [name] by phone today.  What matters to the patients health and wellness today?  Receive [employer], Services, Resources & Referrals.    Goals Addressed               This Visit's Progress     Receive [employer], Services, Resources & Referrals. 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, Attentio Deficit Disorder, Seeking Part-Time Employment.]  General Interventions   General Interventions Discussed/Reviewed General Interventions Discussed, Doctor Visits, Communication with, [facility], General Interventions Reviewed, Level of Care, Durable Medical Equipment (DME), Health Screening, Vaccines  [Encouraged Routine Engagement with Care Team Members.]  Vaccines COVID-19, Flu, Pneumonia, RSV, Shingles, Tetanus/Pertussis/Diphtheria  [Encouraged Annual Vaccinations.]  Doctor Visits Discussed/Reviewed Doctor Visits Discussed, Specialist, Doctor Visits Reviewed, Annual Wellness Visits, PCP  [Encouraged Routine Engagement with Care Team Members.]  Health Screening Colonoscopy, Prostate  [Encouraged Annual Health Screenings.]  Durable Medical Equipment (DME) --  [None.]  PCP/Specialist Visits Compliance with follow-up visit  [Encouraged Routine Engagement with Care Team Members.]  Communication with PCP/Specialists, RN, Pharmacists, Social Work  [employer] Routine Engagement with Care Team Members.]  Level of Care Adult  Daycare, Air Traffic Controller, Assisted Living, Skilled Nursing Facility  [Confirmed Disinterest in Enrollment in Adult Day Care Program or Receiving Assistance Pursuring Higher Level of Care Placement Options (I.e Assisted Living Versus Rest Home).]  Applications Medicaid, Other, Personal Care Services  [facility] Active [place] Access Medicaid Recipient,  Confirmed Disinterest in Applying for Personal Care Services. Confirmed Background Check for Housing Completed on 07/12/2023.]  Exercise Interventions   Exercise Discussed/Reviewed Exercise Discussed, Assistive device use and maintanence, Exercise Reviewed, Physical Activity, Weight Managment  [Encouraged Increased Level of Activity & Exercise, as Tolerated & Encouraged Weight Loss & Healthy Diet.]  Physical Activity Discussed/Reviewed Physical Activity Discussed, Home Exercise Program (HEP), PREP, Physical Activity Reviewed, Gym, Types of exercise  [Encouraged Daily Exercise Regimen.]  Weight Management Weight loss  [Encouraged Healthy Diet & Exercise.]  Education Interventions   Education Provided Provided Education  [Thoroughly Reviewed Educational Material & Entertained Questions to Ensure Understanding.]  Provided Verbal Education On Nutrition, Mental Health/Coping with Illness, When to see the doctor, Medication, Programmer, Applications, Air Traffic Controller, Exercise  [Encouraged Consideration & Implementation.]  Ship Broker, Other, Personal Care Services  [employer] Active [employer] Recipient,  Confirmed Disinterest in Applying for [employer]. Confirmed Background Check for Housing Completed on 07/12/2023.]  Mental Health Interventions   Mental Health Discussed/Reviewed Mental Health Discussed, Depression, Anxiety, Mental Health Reviewed, Grief and Loss, Coping Strategies, Crisis, Other, Suicide, Substance Abuse  [Assessed Mental Health & Cognitive Status.]  Nutrition Interventions   Nutrition Discussed/Reviewed Nutrition  Discussed, Adding fruits and vegetables, Increasing proteins, Decreasing fats, Decreasing salt, Portion sizes, Fluid intake, Nutrition Reviewed, Carbohydrate meal planning, Decreasing sugar intake  [Encouraged Healthy Diet & Drink Earline of Fluids.]  Pharmacy Interventions   Pharmacy Dicussed/Reviewed Pharmacy Topics Discussed, Medications and their functions, Pharmacy Topics Reviewed, Medication Adherence, Affording Medications  [  Confirmed Ability to Afford Prescription Medications.]  Medication Adherence --  [Confirmed Prescription Medication Compliance.]  Safety Interventions   Safety Discussed/Reviewed Safety Discussed, Safety Reviewed  Advanced Directive Interventions   Advanced Directives Discussed/Reviewed Advanced Directives Discussed, Advanced Directives Reviewed, Advanced Care Planning  [Encouraged Initiation of Advanced Directives (Living Will & Healthcare Power of Corporate Treasurer), Offering to [employer], Assist with Completion & Scan Copies into Electronic Medical Record in Epic.]       Active Listening & Reflection Utilized.  Verbalization of Feelings Encouraged.  Emotional Support Provided. Acceptance & Commitment Therapy Indicated. Cognitive Behavioral Therapy Implemented. Client-Centered Therapy Performed. CSW Collaboration with Receptionist at [facility] [phone]), Via [address] Call, to Assist with Scheduling Follow-Up Appointment for Patient with Primary Care Provider, Dr. Jacqulyn Ahle.    ~ Follow-Up Appointment Scheduled on 08/01/2023 at 11:20 AM. Encouraged Review & Engagement with [employer], [employer] of Interest, from List Provided, Emailed on 07/31/2023, Notifying CSW if Assistance is Needed with Referral Process:   ~ Hydrographic Surveyor.     ~ Art Gallery Manager.   ~ Chief Strategy Officer.   ~ Partners Ending Homelessness Pamphlet.   ~ Low-Income Housing Resources in [place], [place].   ~ [employer].   ~ Affordable Apartments & Rental Properties in [place]. Encouraged Initiation of Applications for [employer], from List Provided, Emailed on 07/31/2023, Notifying CSW if Assistance if Needed with Application Completion & Submission:   ~ Counselling Psychologist.   ~ Section 8 Housing Application. Encouraged Routine Engagement with Vyctoria Dickman, Licensed Clinical Social Worker with [facility] [phone]), if You Have Questions, Need Assistance, or If Additional Social Work Needs Are Identified Between Now & Our Next Follow-Up Outreach Call, Scheduled on 08/21/2023 at 2:00 PM.        SDOH assessments and interventions completed:  Yes.  SDOH Interventions Today    Flowsheet Row Most Recent Value  SDOH Interventions   Food Insecurity Interventions Intervention Not Indicated, Other (Comment)  [Receives $220.00 Food Stamps Per Month.Has List of Food [employer], Stage Manager & Soup Kitchens.]  Housing Interventions Other (Comment), [facility] Provided  [employer], Nurse, Adult, Resources, Air Traffic Controller & Assisted with Referrals.]  Transportation Interventions Intervention Not Indicated, Patient Resources (Friends/Family), Associate Professor, Other (Comment)  Agricultural Consultant & Recipient of Medicaid Transportation.]  Utilities Interventions Intervention Not Indicated, Other (Comment)  [Provided Film/video Editor, Services & Resource to Help Pay for Rent & Utilities.]  Alcohol Usage Interventions Intervention Not Indicated (Score <7)  Depression Interventions/Treatment  Medication, Counseling, Currently on Treatment, Community Resources Provided  [employer] Motivation for [employer] Strain Interventions Intervention Not Indicated, Other (Comment), Programmer, Applications Provided  [employer] of Lobbyist, Nurse, Adult & Resources & Encouraged Careers Information Officer, Offering Assistance with Referral Process.]  Physical  Activity Interventions Intervention Not Indicated, Programmer, Applications Provided, Other (Comments)  [Encouraged Water Aerobics at [employer].]  Stress Interventions Intervention Not Indicated, Programmer, Applications Provided, [employer], Provide Counseling  [Provided [employer], Nurse, Adult & Resources & Offered Counseling Services Until Established with [employer] Mental Health Provider.]  Social Connections Interventions Intervention Not Indicated  Health Literacy Interventions Intervention Not Indicated     Care Coordination Interventions:  Yes, provided.   Follow up plan: Follow up call scheduled for 08/21/2023 at 2:00 pm.  Encounter Outcome:  Patient Visit Completed.   Philippe Desanctis, BSW, MSW, Printmaker Social Work Case Set Designer  Health  [facility], Population Health Direct Dial: [phone]  Fax: [phone] Email: [email or web address]: [email or web address]

## 2023-07-31 NOTE — Telephone Encounter (Signed)
 Copied from CRM #[id number]. Topic: Clinical - Red Word Triage >> Jul 31, 2023 11:37 AM Deleta HERO wrote: Red Word that prompted transfer to Nurse Triage: Patient has ear infection which is causing itchiness and severe pain, experiencing congestion as well.   Chief Complaint: Ear Infection Follow Up Symptoms: Cough, Burning Ear Pain, Runny Nose Frequency: Since Last Week Pertinent Negatives: Patient denies chest pain, fever,  Disposition: [] ED /[] Urgent Care (no appt availability in office) / [x] Appointment(In office/virtual)/ []  [facility]/ [] Home Care/ [] Refused Recommended Disposition /[] [facility]/ []  Follow-up with PCP Additional Notes: TS is a 28 year old male being triaged today for ear symptoms previously treated with AMOXIL . The patient reports no improvement in symptoms and reports a burning sensation in his ear.     Reason for Disposition  Earache persists > 1 hour  Answer Assessment - Initial Assessment Questions 1. LOCATION: Which ear is involved?       Both  2. SENSATION: Describe how the ear feels. (e.g. stuffy, full, plugged).      Burning  3. ONSET:  When did the ear symptoms start?       Middle of last week  4. PAIN: Do you also have an earache? If Yes, ask: How bad is it? (Scale 1-10; or mild, moderate, severe)     8  5. CAUSE: What do you think is causing the ear congestion?     Unsure, Previous Ear infection  6. URI: Do you have a runny nose or cough?      Cough and Runny Nose  7. NASAL ALLERGIES: Are there symptoms of hay fever, such as sneezing or a clear nasal discharge?     No  Protocols used: Ear - Congestion-A-AH

## 2023-08-01 ENCOUNTER — Ambulatory Visit (INDEPENDENT_AMBULATORY_CARE_PROVIDER_SITE_OTHER): Payer: MEDICAID | Admitting: Family Medicine

## 2023-08-01 VITALS — BP 116/82 | HR 72 | Temp 98.1°F | Ht 65.0 in | Wt 234.4 lb

## 2023-08-01 DIAGNOSIS — J988 Other specified respiratory disorders: Secondary | ICD-10-CM | POA: Diagnosis not present

## 2023-08-01 MED ORDER — AMOXICILLIN-POT CLAVULANATE 875-125 MG PO TABS: 1.0000 | ORAL_TABLET | Freq: Two times a day (BID) | ORAL | 0 refills | Status: DC

## 2023-08-01 NOTE — Progress Notes (Signed)
 Subjective:  Patient ID: Elijah [name], male    DOB: 10-26-1995  Age: 28 y.o. MRN: [id number]  CC:  Ear pain    HPI:  28 year old male presents for evaluation of ear pain.  Congestion and ear pain x 2 weeks.  Recently seen in the ER on 12/31 and treated with Amoxicillin  for otitis media.  Patient did not pick up Rx as he did not have any money.  Continues to have congestion and ear pain. No fever. No other complaints.  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   GERD (gastroesophageal reflux disease) 03/09/2017   Adjustment disorder with mixed anxiety and depressed mood 03/09/2017   Autism spectrum disorder 03/08/2017   Intellectual disability 11/04/2016   Auditory hallucinations    Learning disability 12/21/2014   ADD (attention deficit disorder) 11/24/2012   Morbid obesity (HCC) 11/24/2012    Social Hx   Social History   Socioeconomic History   Marital status: Single    Spouse name: Not on file   Number of children: 0   Years of education: 12   Highest education level: 12th grade  Occupational History   Not on file  Tobacco Use   Smoking status: Never    Passive exposure: Never   Smokeless tobacco: Never  Vaping Use   Vaping status: Former  Substance and Sexual Activity   Alcohol use: Not Currently    Comment: occassional   Drug use: No   Sexual activity: Never    Birth control/protection: None  Other Topics Concern   Not on file  Social History Narrative   Not on file   Social Drivers of Health   Financial Resource Strain: Low Risk  (07/31/2023)   Overall Financial Resource Strain (CARDIA)    Difficulty of Paying Living Expenses: Not hard at all  Recent Concern: Financial Resource Strain - High Risk (05/03/2023)   Overall Financial Resource Strain (CARDIA)    Difficulty of Paying Living Expenses: Very hard  Food Insecurity: No Food Insecurity  (07/31/2023)   Hunger Vital Sign    Worried About Running Out of Food in the Last Year: Never true    Ran Out of Food in the Last Year: Never true  Transportation Needs: No Transportation Needs (07/31/2023)   PRAPARE - Administrator, Civil Service (Medical): No    Lack of Transportation (Non-Medical): No  Physical Activity: Insufficiently Active (07/31/2023)   Exercise Vital Sign    Days of Exercise per Week: 5 days    Minutes of Exercise per Session: 20 min  Stress: No Stress Concern Present (07/31/2023)   [employer] of Occupational Health - Occupational Stress Questionnaire    Feeling of Stress : Only a little  Recent Concern: Stress - Stress Concern Present (05/03/2023)   [employer] of Occupational Health - Occupational Stress Questionnaire    Feeling of Stress : Very much  Social Connections: Moderately Integrated (07/31/2023)   Social Connection and Isolation Panel [NHANES]    Frequency of Communication with Friends and Family: More than three times a week    Frequency of Social Gatherings with Friends and Family: More than three times a week    Attends Religious Services: More than 4 times per year    Active Member of [employer] or Organizations: Yes    Attends Banker Meetings: More than 4 times per year  Marital Status: Never married    Review of Systems Per HPI  Objective:  BP 116/82   Pulse 72   Temp 98.1 F (36.7 C)   Ht 5' 5 (1.651 m)   Wt 234 lb 6.4 oz (106.3 kg)   SpO2 96%   BMI 39.01 kg/m      08/01/2023   11:23 AM 07/25/2023    6:17 PM 05/17/2023    4:00 PM  BP/Weight  Systolic BP 116 122 110  Diastolic BP 82 107 68  Wt. (Lbs) 234.4 233   BMI 39.01 kg/m2 38.77 kg/m2     Physical Exam Vitals and nursing note reviewed.  Constitutional:      General: He is not in acute distress.    Appearance: Normal appearance.  HENT:     Head: Normocephalic and atraumatic.     Right Ear: Tympanic membrane normal.     Left Ear:  Tympanic membrane normal.  Eyes:     General:        Right eye: No discharge.        Left eye: No discharge.     Conjunctiva/sclera: Conjunctivae normal.  Cardiovascular:     Rate and Rhythm: Normal rate and regular rhythm.  Pulmonary:     Effort: Pulmonary effort is normal.     Breath sounds: Normal breath sounds. No wheezing, rhonchi or rales.  Neurological:     Mental Status: He is alert.  Psychiatric:        Mood and Affect: Mood normal.        Behavior: Behavior normal.     Lab Results  Component Value Date   WBC 11.6 (H) 01/09/2019   HGB 14.1 01/09/2019   HCT 44.6 01/09/2019   PLT 322 01/09/2019   GLUCOSE 103 (H) 04/14/2023   CHOL 186 04/14/2023   TRIG 85 04/14/2023   HDL 37 (L) 04/14/2023   LDLCALC 133 (H) 04/14/2023   ALT 17 08/09/2022   AST 13 08/09/2022   NA 140 04/14/2023   K 4.3 04/14/2023   CL 103 04/14/2023   CREATININE 0.75 (L) 04/14/2023   BUN 11 04/14/2023   CO2 21 04/14/2023   TSH 1.040 08/09/2022   HGBA1C 6.3 (H) 04/14/2023     Assessment & Plan:   Problem List Items Addressed This Visit       Respiratory   Respiratory infection - Primary   Treating with Augmentin .       Meds ordered this encounter  Medications   amoxicillin -clavulanate (AUGMENTIN ) 875-125 MG tablet    Sig: Take 1 tablet by mouth 2 (two) times daily.    Dispense:  14 tablet    Refill:  0    Follow-up:  Return if symptoms worsen or fail to improve.  Jacqulyn Ahle DO [facility]

## 2023-08-01 NOTE — Assessment & Plan Note (Signed)
 Treating with Augmentin.

## 2023-08-01 NOTE — Patient Instructions (Signed)
Antibiotic as prescribed.  Take care  Dr. Oddis Westling  

## 2023-08-15 ENCOUNTER — Ambulatory Visit: Payer: MEDICAID | Admitting: Nurse Practitioner

## 2023-08-15 ENCOUNTER — Encounter: Payer: Self-pay | Admitting: Nurse Practitioner

## 2023-08-15 VITALS — BP 114/88 | HR 101 | Temp 99.0°F | Ht 65.0 in | Wt 230.0 lb

## 2023-08-15 DIAGNOSIS — R062 Wheezing: Secondary | ICD-10-CM | POA: Diagnosis not present

## 2023-08-15 DIAGNOSIS — R053 Chronic cough: Secondary | ICD-10-CM

## 2023-08-15 DIAGNOSIS — J209 Acute bronchitis, unspecified: Secondary | ICD-10-CM

## 2023-08-15 MED ORDER — AZITHROMYCIN 250 MG PO TABS: ORAL_TABLET | ORAL | 0 refills | Status: AC

## 2023-08-15 MED ORDER — PREDNISONE 20 MG PO TABS: ORAL_TABLET | ORAL | 0 refills | Status: DC

## 2023-08-15 MED ORDER — ALBUTEROL SULFATE HFA 108 (90 BASE) MCG/ACT IN AERS: 2.0000 | INHALATION_SPRAY | RESPIRATORY_TRACT | 0 refills | Status: AC | PRN

## 2023-08-15 NOTE — Progress Notes (Signed)
Subjective:    Patient ID: Elijah [name], male    DOB: 07/22/1996, 28 y.o.   MRN: [id number]  HPI Elijah [name] presents today for a follow up after going to the emergency department for acute bronchitis. He was seen in the office on 07/31/22 for a respiratory illness and was prescribed Augmentin. Finished antibiotics, symptoms did not resolve and began to feel worse on 08/08/2022. Started having body aches, fever, chills, increased thirst, and cough was worsening. Seen at [facility] on 1/18 where they diagnosed him with acute bronchitis, and treated him with IV fluids. Had a fever of 103 in the hospital. Completed CXR.   He presents today with cough, runny nose, fever, chills, congestion, and new ear pain. Reports wheezing with cough, and describes it as painful in his chest. Does get lightheaded with coughing. Normally smokes 1 cigarette a day, but stopped smoking since he was diagnosed with bronchitis.   Review of Systems  Constitutional:  Positive for chills, fatigue and fever. Negative for appetite change.  HENT:  Positive for congestion, ear pain, rhinorrhea, sinus pressure and sinus pain. Negative for ear discharge and sore throat.   Respiratory:  Positive for cough, chest tightness, shortness of breath and wheezing.   Cardiovascular:  Negative for chest pain and palpitations.  Gastrointestinal:  Negative for diarrhea, nausea and vomiting.  Neurological:  Positive for light-headedness. Negative for headaches.      Objective:   Physical Exam Vitals and nursing note reviewed.  Constitutional:      General: He is not in acute distress.    Appearance: He is not ill-appearing.  HENT:     Ears:     Comments: TMs mild clear effusion, no erythema.     Mouth/Throat:     Mouth: Mucous membranes are moist.  Neck:     Comments: Mild anterior cervical adenopathy.  Cardiovascular:     Rate and Rhythm: Normal rate and regular rhythm.     Heart sounds: Normal heart sounds.  Pulmonary:      Effort: Pulmonary effort is normal. No respiratory distress.     Comments: Expiratory wheezes noted with coughing on the upper anterior portion of the chest. Posterior clear.  Lymphadenopathy:     Cervical: Cervical adenopathy present.  Neurological:     Mental Status: He is alert.  Psychiatric:        Mood and Affect: Mood normal.        Behavior: Behavior normal.        Thought Content: Thought content normal.    Vitals:   08/15/23 0950  BP: 114/88  Pulse: (!) 101  Temp: 99 F (37.2 C)  Height: 5\' 5"  (1.651 m)  Weight: 230 lb (104.3 kg)  SpO2: 99%  BMI (Calculated): 38.27       Assessment & Plan:  1. Acute bronchitis, unspecified organism (Primary)  2. Wheezing   Meds ordered this encounter  Medications   azithromycin (ZITHROMAX) 250 MG tablet    Sig: Take 2 tablets on day 1, then 1 tablet daily on days 2 through 5    Dispense:  6 tablet    Refill:  0    Supervising Provider:   Lilyan Punt A [[id number]]   predniSONE (DELTASONE) 20 MG tablet    Sig: Take two tabs PO daily for 5 days.    Dispense:  10 tablet    Refill:  0    Supervising Provider:   Lilyan Punt A [[id number]]   albuterol (VENTOLIN HFA) 108 (  90 Base) MCG/ACT inhaler    Sig: Inhale 2 puffs into the lungs every 4 (four) hours as needed for wheezing or shortness of breath.    Dispense:  8 g    Refill:  0    Supervising Provider:   Lilyan Punt A [[id number]]    3. Persistent cough -Advised to take Delsym cough medicine OTC as prescribed. If cough persists, consider adding a steroid inhaler.   Educated patient on worsening signs and symptoms and to go to the emergency department.   Return if symptoms worsen or fail to improve.   I have seen and examined this patient alongside the NP student. I have reviewed and verified the student note and agree with the assessment and plan.  Sherie Don, FNP

## 2023-08-18 ENCOUNTER — Encounter: Payer: Self-pay | Admitting: Nurse Practitioner

## 2023-08-21 ENCOUNTER — Ambulatory Visit: Payer: Self-pay | Admitting: *Deleted

## 2023-08-21 NOTE — Patient Outreach (Signed)
Care Coordination   Follow Up Visit Note   08/21/2023  Name: Elijah [name] MRN: [id number] DOB: 21-Jun-1996  Elijah [name] is a 28 y.o. year old male who sees Luking, Jonna Coup, MD for primary care. I spoke with Elijah [name] by phone today.  What matters to the patients health and wellness today?  Receive [employer], Services, Resources, & Referrals.    Goals Addressed               This Visit's Progress     Receive [employer], Services, Resources, & Referrals. 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, Attentio Deficit Disorder, Seeking Part-Time Employment.]  General Interventions   General Interventions Discussed/Reviewed General Interventions Discussed, Doctor Visits, Communication with, [facility], General Interventions Reviewed, Level of Care, Durable Medical Equipment (DME), Health Screening, Vaccines  [Encouraged Routine Engagement with Care Team Members.]  Vaccines COVID-19, Flu, Pneumonia, RSV, Shingles, Tetanus/Pertussis/Diphtheria  [Encouraged Annual Vaccinations.]  Doctor Visits Discussed/Reviewed Doctor Visits Discussed, Specialist, Doctor Visits Reviewed, Annual Wellness Visits, PCP  [Encouraged Routine Engagement with Care Team Members.]  Health Screening Colonoscopy, Prostate  [Encouraged Annual Health Screenings.]  Durable Medical Equipment (DME) --  [None.]  PCP/Specialist Visits Compliance with follow-up visit  [Encouraged Routine Engagement with Care Team Members.]  Communication with PCP/Specialists, RN, Pharmacists, Social Work  [employer] Routine Engagement with Care Team Members.]  Level of Care  Adult Daycare, Air traffic controller, Assisted Living, Skilled Nursing Facility  [Confirmed Disinterest in Enrollment in Adult Day Care Program or Receiving Assistance Pursuring Higher Level of Care Placement Options (I.e Assisted Living Versus Rest Home).]  Applications Medicaid, Other, Personal Care Services  [facility] Active [place] Access Medicaid Recipient,  Confirmed Disinterest in Applying for Personal Care Services. Confirmed Background Check for Housing Completed on 07/12/2023.]  Exercise Interventions   Exercise Discussed/Reviewed Exercise Discussed, Assistive device use and maintanence, Exercise Reviewed, Physical Activity, Weight Managment  [Encouraged Increased Level of Activity & Exercise, as Tolerated & Encouraged Weight Loss & Healthy Diet.]  Physical Activity Discussed/Reviewed Physical Activity Discussed, Home Exercise Program (HEP), PREP, Physical Activity Reviewed, Gym, Types of exercise  [Encouraged Daily Exercise Regimen.]  Weight Management Weight loss  [Encouraged Healthy Diet & Exercise.]  Education Interventions   Education Provided Provided Education  [Thoroughly Reviewed Educational Material & Entertained Questions to Ensure Understanding.]  Provided Verbal Education On Nutrition, Mental Health/Coping with Illness, When to see the doctor, Medication, Programmer, applications, Air traffic controller, Exercise  [Encouraged Consideration & Implementation.]  Ship broker, Other, Personal Care Services  [employer] Active [employer] Recipient,  Confirmed Disinterest in Applying for [employer]. Confirmed Background Check for Housing Completed on 07/12/2023.]  Mental Health Interventions   Mental Health Discussed/Reviewed Mental Health Discussed, Depression, Anxiety, Mental Health Reviewed, Grief and Loss, Coping Strategies, Crisis, Other, Suicide, Substance Abuse  [Assessed Mental Health & Cognitive Status.]  Nutrition Interventions   Nutrition Discussed/Reviewed  Nutrition Discussed, Adding fruits and vegetables, Increasing proteins, Decreasing fats, Decreasing salt, Portion sizes, Fluid intake, Nutrition Reviewed, Carbohydrate meal planning, Decreasing sugar intake  [Encouraged Healthy Diet & Drink Cathleen Fears of Fluids.]  Pharmacy Interventions   Pharmacy Dicussed/Reviewed Pharmacy Topics Discussed, Medications and their functions, Pharmacy Topics Reviewed, Medication Adherence, Affording Medications  [  Confirmed Ability to Afford Prescription Medications.]  Medication Adherence --  [Confirmed Prescription Medication Compliance.]  Safety Interventions   Safety Discussed/Reviewed Safety Discussed, Safety Reviewed  Advanced Directive Interventions   Advanced Directives Discussed/Reviewed Advanced Directives Discussed, Advanced Directives Reviewed, Advanced Care Planning  [Encouraged Initiation of Advanced Directives (Living Will & Healthcare Power of Corporate treasurer), Offering to [employer], Assist with Completion & Scan Copies into Electronic Medical Record in Epic.]       Active Listening & Reflection Utilized.  Verbalization of Feelings Encouraged.  Emotional Support Provided. Acceptance & Commitment Therapy Initiated. Cognitive Behavioral Therapy Performed. Client-Centered Therapy Implemented. Confirmed Eligibility for Social Security Disability, through [facility] [employer] [phone]). Confirmed Interest in Seeking Part-Time Employment with [employer], Awaiting On-Boarding Process, Having Already [employer] Background Check. Confirmed Eligibility for Full Adult Medicaid Coverage/Benefits, through [facility] Department of Social Services [phone]). Confirmed Eligibility for Food Stamps, through [employer] of Health & Human Services, [facility] Division of Health Benefits [phone]).  Confirmed Interest in Pursuing Residency Near His [place],  Currently Living in [facility] Housing in [place], [place], Conservation officer, historic buildings & Offering to Assist with Completion & Submission.  Encouraged Continued Engagement with [employer], Nurse, adult, Field seismologist of Interest in [place], from List Provided:   ~ Hydrographic surveyor.     ~ Art gallery manager.   ~ Chief Strategy Officer.   ~ [employer] Ending Homelessness Pamphlet.   ~ Low-Income Housing Resources in [place], [place].   ~ [place], [place] [employer].   ~ Affordable Apartments & Rental Properties in [place]. Encouraged Routine Engagement with Danford Bad, Licensed Clinical Social Worker with [facility] [phone]), if You Have Questions, Need Assistance, or If Additional Social Work Needs Are Identified Between Now & Our Next Follow-Up Outreach Call, Scheduled on 09/19/2023 at 9:45 AM.      SDOH assessments and interventions completed:  Yes.  Care Coordination Interventions:  Yes, provided.   Follow up plan: Follow up call scheduled for 09/19/2023 at 9:45 am.  Encounter Outcome:  Patient Visit Completed.   Danford Bad, BSW, MSW, LCSW [facility], [facility] Clinical Social Worker II Direct Dial: [phone]  Fax: [phone] Website: [email or web address]

## 2023-08-21 NOTE — Patient Instructions (Signed)
Visit Information  Thank you for taking time to visit with me today. Please don't hesitate to contact me if I can be of assistance to you.   Following are the goals we discussed today:   Goals Addressed               This Visit's Progress     Receive [employer], Services, Resources, & Referrals. 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, Attentio Deficit Disorder, Seeking Part-Time Employment.]  General Interventions   General Interventions Discussed/Reviewed General Interventions Discussed, Doctor Visits, Communication with, [facility], General Interventions Reviewed, Level of Care, Durable Medical Equipment (DME), Health Screening, Vaccines  [Encouraged Routine Engagement with Care Team Members.]  Vaccines COVID-19, Flu, Pneumonia, RSV, Shingles, Tetanus/Pertussis/Diphtheria  [Encouraged Annual Vaccinations.]  Doctor Visits Discussed/Reviewed Doctor Visits Discussed, Specialist, Doctor Visits Reviewed, Annual Wellness Visits, PCP  [Encouraged Routine Engagement with Care Team Members.]  Health Screening Colonoscopy, Prostate  [Encouraged Annual Health Screenings.]  Durable Medical Equipment (DME) --  [None.]  PCP/Specialist Visits Compliance with follow-up visit  [Encouraged Routine Engagement with Care Team Members.]  Communication with PCP/Specialists, RN, Pharmacists, Social Work  [employer] Routine Engagement with Care Team Members.]  Level of Care Adult Daycare, Air traffic controller, Assisted Living, Skilled Nursing Facility  [Confirmed Disinterest in Enrollment in Adult Day Care Program or Receiving Assistance Pursuring Higher Level of Care  Placement Options (I.e Assisted Living Versus Rest Home).]  Applications Medicaid, Other, Personal Care Services  [facility] Active [place] Access Medicaid Recipient,  Confirmed Disinterest in Applying for Personal Care Services. Confirmed Background Check for Housing Completed on 07/12/2023.]  Exercise Interventions   Exercise Discussed/Reviewed Exercise Discussed, Assistive device use and maintanence, Exercise Reviewed, Physical Activity, Weight Managment  [Encouraged Increased Level of Activity & Exercise, as Tolerated & Encouraged Weight Loss & Healthy Diet.]  Physical Activity Discussed/Reviewed Physical Activity Discussed, Home Exercise Program (HEP), PREP, Physical Activity Reviewed, Gym, Types of exercise  [Encouraged Daily Exercise Regimen.]  Weight Management Weight loss  [Encouraged Healthy Diet & Exercise.]  Education Interventions   Education Provided Provided Education  [Thoroughly Reviewed Educational Material & Entertained Questions to Ensure Understanding.]  Provided Verbal Education On Nutrition, Mental Health/Coping with Illness, When to see the doctor, Medication, Programmer, applications, Air traffic controller, Exercise  [Encouraged Consideration & Implementation.]  Ship broker, Other, Personal Care Services  [employer] Active [employer] Recipient,  Confirmed Disinterest in Applying for [employer]. Confirmed Background Check for Housing Completed on 07/12/2023.]  Mental Health Interventions   Mental Health Discussed/Reviewed Mental Health Discussed, Depression, Anxiety, Mental Health Reviewed, Grief and Loss, Coping Strategies, Crisis, Other, Suicide, Substance Abuse  [Assessed Mental Health & Cognitive Status.]  Nutrition Interventions   Nutrition Discussed/Reviewed Nutrition Discussed, Adding fruits and vegetables, Increasing proteins, Decreasing fats, Decreasing salt, Portion sizes, Fluid intake, Nutrition Reviewed, Carbohydrate meal planning, Decreasing  sugar intake  [Encouraged Healthy Diet & Drink Cathleen Fears of Fluids.]  Pharmacy Interventions   Pharmacy Dicussed/Reviewed Pharmacy Topics Discussed, Medications and their functions, Pharmacy Topics Reviewed, Medication Adherence, Affording Medications  [Confirmed Ability to [employer] Prescription Medications.]  Medication Adherence --  [Confirmed Prescription Medication Compliance.]  Safety Interventions   Safety Discussed/Reviewed Safety Discussed, Safety Reviewed  Advanced Directive Interventions  Advanced Directives Discussed/Reviewed Advanced Directives Discussed, Advanced Directives Reviewed, Advanced Care Planning  [Encouraged Initiation of Advanced Directives (Living Will & Healthcare Power of Corporate treasurer), Offering to [employer], Assist with Completion & Scan Copies into Electronic Medical Record in Epic.]       Active Listening & Reflection Utilized.  Verbalization of Feelings Encouraged.  Emotional Support Provided. Acceptance & Commitment Therapy Initiated. Cognitive Behavioral Therapy Performed. Client-Centered Therapy Implemented. Confirmed Eligibility for Social Security Disability, through [facility] [employer] [phone]). Confirmed Interest in Seeking Part-Time Employment with [employer], Awaiting On-Boarding Process, Having Already [employer] Background Check. Confirmed Eligibility for Full Adult Medicaid Coverage/Benefits, through [facility] Department of Social Services [phone]). Confirmed Eligibility for Food Stamps, through [employer] of Health & Human Services, [facility] Division of Health Benefits [phone]).  Confirmed Interest in Pursuing Residency Near His [place], Currently Living in [facility] Housing in [place], [place], Conservation officer, historic buildings & Offering to Assist with Completion & Submission.  Encouraged Continued Engagement with [employer],  Nurse, adult, Field seismologist of Interest in [place], from List Provided:   ~ Hydrographic surveyor.    ~ Art gallery manager.   ~ Chief Strategy Officer.   ~ Partners Ending Homelessness Pamphlet.   ~ Low-Income Housing Resources in [place], [place].   ~ [place], [employer].   ~ Affordable Apartments & Rental Properties in [place]. Encouraged Routine Engagement with Danford Bad, Licensed Clinical Social Worker with [facility] [phone]), if You Have Questions, Need Assistance, or If Additional Social Work Needs Are Identified Between Now & Our Next Follow-Up Outreach Call, Scheduled on 09/19/2023 at 9:45 AM.      Our next appointment is by telephone on 09/19/2023 at 9:45 am.  Please call the care guide team at [phone] if you need to cancel or reschedule your appointment.   If you are experiencing a Mental Health or Behavioral Health Crisis or need someone to talk to, please call the Suicide and Crisis Lifeline: 988 call the [place] National Suicide Prevention Lifeline: 807 608 9584 or TTY: (319)413-1314 TTY 7017899965) to talk to a trained counselor call 1-800-273-TALK (toll free, 24 hour hotline) go to [facility] [address] [place] [phone]) call the [facility] Crisis Line: [phone] call 911  Patient verbalizes understanding of instructions and care plan provided today and agrees to view in MyChart. Active MyChart status and patient understanding of how to access instructions and care plan via MyChart confirmed with patient.     Telephone follow up appointment with care management team member scheduled for:   09/19/2023 at 9:45 am.   Danford Bad, BSW, MSW, LCSW [facility], [facility] Clinical Social Worker II Direct Dial: [phone]  Fax: [phone] Website: [email or web address]

## 2023-08-24 ENCOUNTER — Ambulatory Visit: Payer: MEDICAID | Admitting: Family Medicine

## 2023-08-30 ENCOUNTER — Ambulatory Visit: Payer: MEDICAID | Admitting: Family Medicine

## 2023-08-30 VITALS — BP 116/71 | HR 85 | Temp 97.1°F | Ht 65.0 in | Wt 226.0 lb

## 2023-08-30 DIAGNOSIS — E785 Hyperlipidemia, unspecified: Secondary | ICD-10-CM

## 2023-08-30 DIAGNOSIS — E119 Type 2 diabetes mellitus without complications: Secondary | ICD-10-CM

## 2023-08-30 DIAGNOSIS — E1169 Type 2 diabetes mellitus with other specified complication: Secondary | ICD-10-CM

## 2023-08-30 MED ORDER — METFORMIN HCL 500 MG PO TABS: ORAL_TABLET | ORAL | 6 refills | Status: DC

## 2023-08-30 NOTE — Progress Notes (Signed)
   Subjective:    Patient ID: Elijah [name], male    DOB: [birth year], 28 y.o.   MRN: [id number]  Discussed the use of AI scribe software for clinical note transcription with the patient, who gave verbal consent to proceed.  History of Present Illness   Elijah [name] is a 28 year old male who presents with respiratory symptoms related to wood burning exposure.  He experiences ongoing respiratory symptoms, particularly in the mornings, which he attributes to wood burning in his home. His throat feels 'cloggy', and he finds relief with the use of an inhaler, which he uses primarily in the mornings upon waking. The wood smoke circulates through the house via ductwork, and he keeps his bedroom cool by leaving a window cracked. No wheezing or significant lung congestion is reported, indicating the symptoms are primarily upper respiratory.  He mentions a recent hospital visit a couple of weeks ago due to illness, during which his blood sugar levels were checked and found to be satisfactory. Additional blood work was performed, including tests for liver enzymes, heart function, hemoglobin, and sugar levels, all of which returned normal results.  He maintains a relatively active lifestyle, engaging in walking and running around his house and yard. He reports a stable weight and feels his overall health is 'pretty good'. He also mentions efforts to maintain healthy eating habits.  Socially, he is working on reducing his online betting activities and has visited a casino a couple of times, ensuring to limit his spending. He continues to use [employer] for his medication needs.         Review of Systems     Objective:    Physical Exam   VITALS: BP 114/72 CHEST: Breath sounds clear CARDIOVASCULAR: Heart sounds normal           Assessment & Plan:  Assessment and Plan    Respiratory symptoms Reports morning congestion and throat discomfort due to wood burning in the home.  Using an inhaler for relief. -Continue use of inhaler as needed for relief. -Consider use of a humidifier to add moisture to the air in the home.  General Health Maintenance Recent hospital visit with normal lab results. A1C checked last fall. -Order labs including A1C and kidney function tests to be done within the next 30 days at [facility].  Lifestyle Reports regular physical activity and maintaining a healthy diet. -Encourage continuation of healthy lifestyle habits.  betting Reports occasional visits to the casino, but maintains control over spending. -Continue to monitor and advise on responsible gambling habits.  Follow-up Plan to review lab results once completed.

## 2023-08-31 ENCOUNTER — Encounter: Payer: Self-pay | Admitting: Family Medicine

## 2023-08-31 LAB — LIPID PANEL
Chol/HDL Ratio: 5.4 {ratio} — ABNORMAL HIGH (ref 0.0–5.0)
Cholesterol, Total: 212 mg/dL — ABNORMAL HIGH (ref 100–199)
HDL: 39 mg/dL — ABNORMAL LOW (ref >39)
LDL Chol Calc (NIH): 141 mg/dL — ABNORMAL HIGH (ref 0–99)
Triglycerides: 175 mg/dL — ABNORMAL HIGH (ref 0–149)
VLDL Cholesterol Cal: 32 mg/dL (ref 5–40)

## 2023-08-31 LAB — CBC WITH DIFFERENTIAL/PLATELET
Basophils Absolute: 0.1 10*3/uL (ref 0.0–0.2)
Basos: 1 %
EOS (ABSOLUTE): 0.1 10*3/uL (ref 0.0–0.4)
Eos: 1 %
Hematocrit: 44.4 % (ref 37.5–51.0)
Hemoglobin: 14 g/dL (ref 13.0–17.7)
Immature Grans (Abs): 0 10*3/uL (ref 0.0–0.1)
Immature Granulocytes: 0 %
Lymphocytes Absolute: 2.4 10*3/uL (ref 0.7–3.1)
Lymphs: 26 %
MCH: 25.4 pg — ABNORMAL LOW (ref 26.6–33.0)
MCHC: 31.5 g/dL (ref 31.5–35.7)
MCV: 81 fL (ref 79–97)
Monocytes Absolute: 0.7 10*3/uL (ref 0.1–0.9)
Monocytes: 7 %
Neutrophils Absolute: 5.8 10*3/uL (ref 1.4–7.0)
Neutrophils: 65 %
Platelets: 351 10*3/uL (ref 150–450)
RBC: 5.51 x10E6/uL (ref 4.14–5.80)
RDW: 15.1 % (ref 11.6–15.4)
WBC: 9 10*3/uL (ref 3.4–10.8)

## 2023-08-31 LAB — MICROALBUMIN / CREATININE URINE RATIO
Creatinine, Urine: 167.2 mg/dL
Microalb/Creat Ratio: 3 mg/g{creat} (ref 0–29)
Microalbumin, Urine: 5 ug/mL

## 2023-08-31 LAB — HEMOGLOBIN A1C
Est. average glucose Bld gHb Est-mCnc: 146 mg/dL
Hgb A1c MFr Bld: 6.7 % — ABNORMAL HIGH (ref 4.8–5.6)

## 2023-08-31 LAB — IRON,TIBC AND FERRITIN PANEL
Ferritin: 17 ng/mL — ABNORMAL LOW (ref 30–400)
Iron Saturation: 20 % (ref 15–55)
Iron: 83 ug/dL (ref 38–169)
Total Iron Binding Capacity: 423 ug/dL (ref 250–450)
UIBC: 340 ug/dL (ref 111–343)

## 2023-09-05 ENCOUNTER — Ambulatory Visit: Payer: MEDICAID | Admitting: Nutrition

## 2023-09-19 ENCOUNTER — Ambulatory Visit: Payer: Self-pay | Admitting: *Deleted

## 2023-09-19 NOTE — Patient Outreach (Signed)
 Care Coordination   Follow Up Visit Note   09/19/2023  Name: Elijah [name] MRN: [id number] DOB: 10-28-1995  Elijah [name] is a 28 y.o. year old male who sees Luking, Jonna Coup, MD for primary care. I spoke with Elijah [name] by phone today.  What matters to the patients health and wellness today?  Receive [employer], Services, Resources, & Referrals.    Goals Addressed               This Visit's Progress     Receive [employer], Services, Resources, & Referrals. 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, Attentio Deficit Disorder, Seeking Part-Time Employment.]  General Interventions   General Interventions Discussed/Reviewed General Interventions Discussed, General Interventions Reviewed, Doctor Visits, Communication with, Community Resources  [Encouraged Routine Engagement with Care Team Members & Providers.]  Labs Hgb A1c every 3 months  [Encouraged Annual Lab Work.]  Vaccines COVID-19, Flu, Pneumonia, RSV, Shingles, Tetanus/Pertussis/Diphtheria  [Encouraged Annual Vaccinations.]  Doctor Visits Discussed/Reviewed Doctor Visits Discussed, Specialist, Doctor Visits Reviewed, Annual Wellness Visits, PCP  [Encouraged Routine Engagement with Care Team Members & Providers.]  Health Screening Bone Density, Colonoscopy, Prostate  [Encouraged Routine Health Screenings.]  Horticulturist, commercial (DME) Other  [Prescription Eyeglasses, Hand-Held Shower Hose, Scales.]  PCP/Specialist Visits Compliance with follow-up visit  [Encouraged Routine Engagement with Care Team Members & Providers.]  Communication with PCP/Specialists, Charity fundraiser, Pharmacists,  Social Work  [employer] Routine Engagement with Care Team Members & Providers.]  Level of Care Adult Daycare, Assisted Living, Skilled Nursing Facility  [Confirmed Disinterest in Enrollment in Adult Day Care Program. Confirmed Disinterest in Receiving Assistance Pursuring Higher Level of Care Placement Options.]  Applications Medicaid, Personal Care Services  [place] Active [place] Access Medicaid Recipient. Confirmed Disinterest in Applying for Personal Care Services.]  Exercise Interventions   Exercise Discussed/Reviewed Exercise Discussed, Assistive device use and maintanence, Exercise Reviewed, Physical Activity, Weight Managment  [Encouraged Increase in Exercise & Activities of Interest, Inside & Outside the Home.]  Physical Activity Discussed/Reviewed Physical Activity Discussed, Home Exercise Program (HEP), PREP, Physical Activity Reviewed, Gym, Types of exercise  [Encouraged Daily Exercise Regimen, as Tolerated.]  Weight Management Weight loss  [Encouraged Healthy Weight Loss Regimen.]  Education Interventions   Education Provided Provided Education  [Thoroughly Reviewed Educational Material to Ensure Understanding & Entertain Questions.]  Provided Verbal Education On Mental Health/Coping with Illness, When to see the doctor, [facility], Air traffic controller  [Encouraged Continued Independent Review of Educational Material Provided.]  Ship broker, Personal Care Services  [employer] Active [employer] Recipient. Confirmed Disinterest in Applying for Personal Care Services.]  Mental Health Interventions   Mental Health Discussed/Reviewed Mental Health Discussed, Anxiety, Depression, Grief and Loss, Mental Health Reviewed, Substance Abuse, Coping Strategies, Suicide, Crisis, Other  [Assessed Mental Health & Cognitive Status. Offered Couseling & Supportive Services.]  Nutrition Interventions   Nutrition Discussed/Reviewed Nutrition Discussed, Nutrition Reviewed, Adding  fruits and vegetables, Increasing proteins, Decreasing fats, Fluid intake, Carbohydrate meal planning, Portion sizes, Decreasing salt, Supplemental nutrition, Decreasing sugar intake  [Encouraged Heart-Healthy, Low Sodium, Low-Fat, Reduced Sugar, High Fiber Diet.]  Pharmacy Interventions   Pharmacy Dicussed/Reviewed Pharmacy Topics Discussed, Medications and their functions, Medication Adherence, Pharmacy Topics Reviewed, Affording  Medications  [Confirmed Ability to [employer] Prescription Medications.]  Medication Adherence --  [Confirmed Compliance with Prescription Medications.]  Safety Interventions   Safety Discussed/Reviewed Safety Discussed, Safety Reviewed  [Encouraged Routine Use of Assistive Devices & Durable Medical Equipment.]  Advanced Directive Interventions   Advanced Directives Discussed/Reviewed Advanced Directives Discussed, Advanced Directives Reviewed  [Encouraged Initiation of Advanced Directives (Living Will & Healthcare Power of Corporate treasurer), Offering to [employer], Assist with Completion, Make Copies, & Scan into Electronic Medical Record in Epic.]       Active Listening & Reflection Utilized.  Verbalization of Feelings Encouraged.  Emotional Support Provided. Acceptance & Commitment Therapy Indicated. Cognitive Behavioral Therapy Initiated. Client-Centered Therapy Performed. Encouraged Routine Engagement with [employer], Nurse, adult, Field seismologist of Interest in [place], from List Provided, in An Effort to [employer], Economist, & Handicapped Accessible Housing:   ~ Hydrographic surveyor.     ~ Art gallery manager.   ~ Chief Strategy Officer.   ~ Partners Ending Homelessness Pamphlet.   ~ Low-Income Housing Resources in [place], [place].   ~ [place], [employer].   ~ Affordable Apartments & Rental Properties in [place]. Encouraged Routine Engagement with Danford Bad, Licensed Clinical Social Worker with [facility], [facility] [phone]), if You Have Questions, Need Assistance, or If Additional Social Work Needs Are Identified Between Now & Our Next Follow-Up Outreach Call, Scheduled on 10/10/2023 at 3:45 PM.      SDOH assessments and interventions completed:  Yes.  Care Coordination Interventions:  Yes, provided.   Follow up plan: Follow up call scheduled for 10/10/2023 at 3:45 pm.  Encounter Outcome:  Patient Visit Completed.    Danford Bad, BSW, MSW, LCSW [facility], [facility] Clinical Social Worker II Direct Dial: [phone]  Fax: [phone] Website: [email or web address]

## 2023-09-19 NOTE — Patient Instructions (Signed)
 Visit Information  Thank you for taking time to visit with me today. Please don't hesitate to contact me if I can be of assistance to you.   Following are the goals we discussed today:   Goals Addressed               This Visit's Progress     Receive [employer], Services, Resources, & Referrals. 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, Attentio Deficit Disorder, Seeking Part-Time Employment.]  General Interventions   General Interventions Discussed/Reviewed General Interventions Discussed, General Interventions Reviewed, Doctor Visits, Communication with, Community Resources  [Encouraged Routine Engagement with Care Team Members & Providers.]  Labs Hgb A1c every 3 months  [Encouraged Annual Lab Work.]  Vaccines COVID-19, Flu, Pneumonia, RSV, Shingles, Tetanus/Pertussis/Diphtheria  [Encouraged Annual Vaccinations.]  Doctor Visits Discussed/Reviewed Doctor Visits Discussed, Specialist, Doctor Visits Reviewed, Annual Wellness Visits, PCP  [Encouraged Routine Engagement with Care Team Members & Providers.]  Health Screening Bone Density, Colonoscopy, Prostate  [Encouraged Routine Health Screenings.]  Horticulturist, commercial (DME) Other  [Prescription Eyeglasses, Hand-Held Shower Hose, Scales.]  PCP/Specialist Visits Compliance with follow-up visit  [Encouraged Routine Engagement with Care Team Members & Providers.]  Communication with PCP/Specialists, Charity fundraiser, Pharmacists, Social Work  [employer] Routine Engagement with Care Team Members & Providers.]  Level of Care Adult Daycare, Assisted Living, Skilled Nursing Facility  [Confirmed Disinterest in  Enrollment in Adult Day Care Program. Confirmed Disinterest in Receiving Assistance Pursuring Higher Level of Care Placement Options.]  Applications Medicaid, Personal Care Services  [place] Active [place] Access Medicaid Recipient. Confirmed Disinterest in Applying for Personal Care Services.]  Exercise Interventions   Exercise Discussed/Reviewed Exercise Discussed, Assistive device use and maintanence, Exercise Reviewed, Physical Activity, Weight Managment  [Encouraged Increase in Exercise & Activities of Interest, Inside & Outside the Home.]  Physical Activity Discussed/Reviewed Physical Activity Discussed, Home Exercise Program (HEP), PREP, Physical Activity Reviewed, Gym, Types of exercise  [Encouraged Daily Exercise Regimen, as Tolerated.]  Weight Management Weight loss  [Encouraged Healthy Weight Loss Regimen.]  Education Interventions   Education Provided Provided Education  [Thoroughly Reviewed Educational Material to Ensure Understanding & Entertain Questions.]  Provided Verbal Education On Mental Health/Coping with Illness, When to see the doctor, [facility], Air traffic controller  [Encouraged Continued Independent Review of Educational Material Provided.]  Ship broker, Personal Care Services  [employer] Active [employer] Recipient. Confirmed Disinterest in Applying for Personal Care Services.]  Mental Health Interventions   Mental Health Discussed/Reviewed Mental Health Discussed, Anxiety, Depression, Grief and Loss, Mental Health Reviewed, Substance Abuse, Coping Strategies, Suicide, Crisis, Other  [Assessed Mental Health & Cognitive Status. Offered Couseling & Supportive Services.]  Nutrition Interventions   Nutrition Discussed/Reviewed Nutrition Discussed, Nutrition Reviewed, Adding fruits and vegetables, Increasing proteins, Decreasing fats, Fluid intake, Carbohydrate meal planning, Portion sizes, Decreasing salt, Supplemental nutrition, Decreasing sugar  intake  [Encouraged Heart-Healthy, Low Sodium, Low-Fat, Reduced Sugar, High Fiber Diet.]  Pharmacy Interventions   Pharmacy Dicussed/Reviewed Pharmacy Topics Discussed, Medications and their functions, Medication Adherence, Pharmacy Topics Reviewed, Affording Medications  [Confirmed Ability to Afford Prescription Medications.]  Medication Adherence --  [Confirmed Compliance with Prescription Medications.]  Safety Interventions   Safety Discussed/Reviewed Safety Discussed, Safety Reviewed  [Encouraged Routine  Use of Assistive Devices & Durable Medical Equipment.]  Advanced Directive Interventions   Advanced Directives Discussed/Reviewed Advanced Directives Discussed, Advanced Directives Reviewed  [Encouraged Initiation of Advanced Directives (Living Will & Healthcare Power of Corporate treasurer), Offering to [employer], Assist with Completion, Make Copies, & Scan into Electronic Medical Record in Epic.]       Active Listening & Reflection Utilized.  Verbalization of Feelings Encouraged.  Emotional Support Provided. Acceptance & Commitment Therapy Indicated. Cognitive Behavioral Therapy Initiated. Client-Centered Therapy Performed. Encouraged Routine Engagement with [employer], Nurse, adult, Field seismologist of Interest in [place], from List Provided, in An Effort to [employer], Economist, & Handicapped Accessible Housing:   ~ Hydrographic surveyor.     ~ Art gallery manager.   ~ Chief Strategy Officer.   ~ Partners Ending Homelessness Pamphlet.   ~ Low-Income Housing Resources in [place], [place].   ~ [place], [employer].   ~ Affordable Apartments & Rental Properties in [place]. Encouraged Routine Engagement with Danford Bad, Licensed Clinical Social Worker with [facility], [facility] [phone]), if You Have Questions, Need Assistance, or If Additional Social Work Needs Are Identified Between Now & Our  Next Follow-Up Outreach Call, Scheduled on 10/10/2023 at 3:45 PM.      Our next appointment is by telephone on 10/10/2023 at 3:45 pm.  Please call the care guide team at [phone] if you need to cancel or reschedule your appointment.   If you are experiencing a Mental Health or Behavioral Health Crisis or need someone to talk to, please call the Suicide and Crisis Lifeline: 988 call the [place] National Suicide Prevention Lifeline: 701 548 1749 or TTY: 757-656-9466 TTY 914-564-6204) to talk to a trained counselor call 1-800-273-TALK (toll free, 24 hour hotline) go to [facility] [address] [place] [phone]) call the [facility] Crisis Line: [phone] call 911  Patient verbalizes understanding of instructions and care plan provided today and agrees to view in MyChart. Active MyChart status and patient understanding of how to access instructions and care plan via MyChart confirmed with patient.     Telephone follow up appointment with care management team member scheduled for:  10/10/2023 at 3:45 pm.   Danford Bad, BSW, MSW, LCSW [place]  [facility], [facility] Clinical Social Worker II Direct Dial: [phone]  Fax: [phone] Website: [email or web address]

## 2023-10-10 ENCOUNTER — Ambulatory Visit: Payer: Self-pay | Admitting: *Deleted

## 2023-10-10 NOTE — Patient Outreach (Signed)
 Care Coordination   Follow Up Visit Note   10/10/2023  Name: Elijah [name] MRN: [id number] DOB: 1995-08-29  Elijah [name] is a 28 y.o. year old male who sees Luking, Jonna Coup, MD for primary care. I spoke with [name] by phone today.  What matters to the patients health and wellness today?  Receive [employer], Services, Resources, & Referrals.     Goals Addressed               This Visit's Progress     COMPLETED: Receive [employer], Services, Resources, & Referrals. 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, Attentio Deficit Disorder, Seeking Part-Time Employment.]  General Interventions   General Interventions Discussed/Reviewed General Interventions Discussed, General Interventions Reviewed, Doctor Visits, Communication with, Community Resources  [Encouraged Routine Engagement with Care Team Members & Providers.]  Labs Hgb A1c every 3 months  [Encouraged Annual Lab Work.]  Vaccines COVID-19, Flu, Pneumonia, RSV, Shingles, Tetanus/Pertussis/Diphtheria  [Encouraged Annual Vaccinations.]  Doctor Visits Discussed/Reviewed Doctor Visits Discussed, Specialist, Doctor Visits Reviewed, Annual Wellness Visits, PCP  [Encouraged Routine Engagement with Care Team Members & Providers.]  Health Screening Bone Density, Colonoscopy, Prostate  [Encouraged Routine Health Screenings.]  Horticulturist, commercial (DME) Other  [Prescription Eyeglasses, Hand-Held Shower Hose, Scales.]  PCP/Specialist Visits Compliance with follow-up visit  [Encouraged Routine Engagement with Care Team Members & Providers.]  Communication with PCP/Specialists, Charity fundraiser,  Pharmacists, Social Work  [employer] Routine Engagement with Care Team Members & Providers.]  Level of Care Adult Daycare, Assisted Living, Skilled Nursing Facility  [Confirmed Disinterest in Enrollment in Adult Day Care Program. Confirmed Disinterest in Receiving Assistance Pursuring Higher Level of Care Placement Options.]  Applications Medicaid, Personal Care Services  [employer] Active [place] Access Medicaid Recipient. Confirmed Disinterest in Applying for Personal Care Services.]  Exercise Interventions   Exercise Discussed/Reviewed Exercise Discussed, Assistive device use and maintanence, Exercise Reviewed, Physical Activity, Weight Managment  [Encouraged Increase in Exercise & Activities of Interest, Inside & Outside the Home.]  Physical Activity Discussed/Reviewed Physical Activity Discussed, Home Exercise Program (HEP), PREP, Physical Activity Reviewed, Gym, Types of exercise  [Encouraged Daily Exercise Regimen, as Tolerated.]  Weight Management Weight loss  [Encouraged Healthy Weight Loss Regimen.]  Education Interventions   Education Provided Provided Education  [Thoroughly Reviewed Educational Material to Ensure Understanding & Entertain Questions.]  Provided Verbal Education On Mental Health/Coping with Illness, When to see the doctor, [employer], Air traffic controller  [Encouraged Continued Independent Review of Educational Material Provided.]  Ship broker, Personal Care Services  [employer] Active [employer] Recipient. Confirmed Disinterest in Applying for Personal Care Services.]  Mental Health Interventions   Mental Health Discussed/Reviewed Mental Health Discussed, Anxiety, Depression, Grief and Loss, Mental Health Reviewed, Substance Abuse, Coping Strategies, Suicide, Crisis, Other  [Assessed Mental Health & Cognitive Status. Offered Couseling & Supportive Services.]  Nutrition Interventions   Nutrition Discussed/Reviewed Nutrition Discussed, Nutrition  Reviewed, Adding fruits and vegetables, Increasing proteins, Decreasing fats, Fluid intake, Carbohydrate meal planning, Portion sizes, Decreasing salt, Supplemental nutrition, Decreasing sugar intake  [Encouraged Heart-Healthy, Low Sodium, Low-Fat, Reduced Sugar, High Fiber Diet.]  Pharmacy Interventions   Pharmacy Dicussed/Reviewed Pharmacy Topics Discussed, Medications and their functions, Medication Adherence, Pharmacy Topics  Reviewed, Affording Medications  [Confirmed Ability to [employer] Prescription Medications.]  Medication Adherence --  [Confirmed Compliance with Prescription Medications.]  Safety Interventions   Safety Discussed/Reviewed Safety Discussed, Safety Reviewed  [Encouraged Routine Use of Assistive Devices & Durable Medical Equipment.]  Advanced Directive Interventions   Advanced Directives Discussed/Reviewed Advanced Directives Discussed, Advanced Directives Reviewed  [Encouraged Initiation of Advanced Directives (Living Will & Healthcare Power of Corporate treasurer), Offering to [employer], Assist with Completion, Make Copies, & Scan into Electronic Medical Record in Epic.]       Active Listening & Reflection Utilized.  Verbalization of Feelings Encouraged.  Emotional Support Provided. Client-Centered Therapy Initiated. Acceptance & Commitment Therapy Performed. Cognitive Behavioral Therapy Conducted. Encouraged Engagement with Danford Bad, Licensed Clinical Social Worker with [facility], [facility] [phone]), if You Have Questions, Need Assistance, Additional Social Work Needs Are Identified in The Near Future, or If You Change Your Mind About Wanting to Receive Social Work Services.      SDOH assessments and interventions completed:  Yes.  Care Coordination Interventions:  Yes, provided.   Follow up plan: No further intervention required.   Encounter Outcome:  Patient Visit Completed.

## 2023-10-10 NOTE — Patient Instructions (Signed)
 Visit Information  Thank you for taking time to visit with me today. Please don't hesitate to contact me if I can be of assistance to you.   Following are the goals we discussed today:   Goals Addressed               This Visit's Progress     COMPLETED: Receive [employer], Services, Resources, & Referrals. 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, Attentio Deficit Disorder, Seeking Part-Time Employment.]  General Interventions   General Interventions Discussed/Reviewed General Interventions Discussed, General Interventions Reviewed, Doctor Visits, Communication with, Financial controller Routine Engagement with Care Team Members & Providers.]  Labs Hgb A1c every 3 months  [Encouraged Annual Lab Work.]  Vaccines COVID-19, Flu, Pneumonia, RSV, Shingles, Tetanus/Pertussis/Diphtheria  [Encouraged Annual Vaccinations.]  Doctor Visits Discussed/Reviewed Doctor Visits Discussed, Specialist, Doctor Visits Reviewed, Annual Wellness Visits, PCP  [Encouraged Routine Engagement with Care Team Members & Providers.]  Health Screening Bone Density, Colonoscopy, Prostate  [Encouraged Routine Health Screenings.]  Horticulturist, commercial (DME) Other  [Prescription Eyeglasses, Hand-Held Shower Hose, Scales.]  PCP/Specialist Visits Compliance with follow-up visit  [Encouraged Routine Engagement with Care Team Members & Providers.]  Communication with PCP/Specialists, Charity fundraiser, Pharmacists, Social Work  [employer] Routine Engagement with Care Team Members & Providers.]  Level of Care Adult Daycare, Assisted Living, Skilled Nursing Facility  [Confirmed Disinterest  in Enrollment in Adult Day Care Program. Confirmed Disinterest in Receiving Assistance Pursuring Higher Level of Care Placement Options.]  Applications Medicaid, Personal Care Services  [employer] Active [place] Access Medicaid Recipient. Confirmed Disinterest in Applying for Personal Care Services.]  Exercise Interventions   Exercise Discussed/Reviewed Exercise Discussed, Assistive device use and maintanence, Exercise Reviewed, Physical Activity, Weight Managment  [Encouraged Increase in Exercise & Activities of Interest, Inside & Outside the Home.]  Physical Activity Discussed/Reviewed Physical Activity Discussed, Home Exercise Program (HEP), PREP, Physical Activity Reviewed, Gym, Types of exercise  [Encouraged Daily Exercise Regimen, as Tolerated.]  Weight Management Weight loss  [Encouraged Healthy Weight Loss Regimen.]  Education Interventions   Education Provided Provided Education  [Thoroughly Reviewed Educational Material to Ensure Understanding & Entertain Questions.]  Provided Verbal Education On Mental Health/Coping with Illness, When to see the doctor, [employer], Air traffic controller  [Encouraged Continued Independent Review of Educational Material Provided.]  Ship broker, Personal Care Services  [employer] Active [employer] Recipient. Confirmed Disinterest in Applying for Personal Care Services.]  Mental Health Interventions   Mental Health Discussed/Reviewed Mental Health Discussed, Anxiety, Depression, Grief and Loss, Mental Health Reviewed, Substance Abuse, Coping Strategies, Suicide, Crisis, Other  [Assessed Mental Health & Cognitive Status. Offered Couseling & Supportive Services.]  Nutrition Interventions   Nutrition Discussed/Reviewed Nutrition Discussed, Nutrition Reviewed, Adding fruits and vegetables, Increasing proteins, Decreasing fats, Fluid intake, Carbohydrate meal planning, Portion sizes, Decreasing salt, Supplemental nutrition, Decreasing sugar  intake  [Encouraged Heart-Healthy, Low Sodium, Low-Fat, Reduced Sugar, High Fiber Diet.]  Pharmacy Interventions   Pharmacy Dicussed/Reviewed Pharmacy Topics Discussed, Medications and their functions, Medication Adherence, Pharmacy Topics Reviewed, Affording Medications  [Confirmed Ability to Afford Prescription Medications.]  Medication Adherence --  [Confirmed Compliance with Prescription Medications.]  Safety Interventions   Safety Discussed/Reviewed Safety Discussed, Safety Reviewed  [Encouraged  Routine Use of Assistive Devices & Durable Medical Equipment.]  Advanced Directive Interventions   Advanced Directives Discussed/Reviewed Advanced Directives Discussed, Advanced Directives Reviewed  [Encouraged Initiation of Advanced Directives (Living Will & Healthcare Power of Corporate treasurer), Offering to [employer], Assist with Completion, Make Copies, & Scan into Electronic Medical Record in Epic.]       Active Listening & Reflection Utilized.  Verbalization of Feelings Encouraged.  Emotional Support Provided. Client-Centered Therapy Initiated. Acceptance & Commitment Therapy Performed. Cognitive Behavioral Therapy Conducted. Encouraged Engagement with Danford Bad, Licensed Clinical Social Worker with [facility], [facility] [phone]), if You Have Questions, Need Assistance, Additional Social Work Needs Are Identified in The Near Future, or If You Change Your Mind About Wanting to Receive Social Work Services.      Please call the care guide team at [phone] if you need to cancel or reschedule your appointment.   If you are experiencing a Mental Health or Behavioral Health Crisis or need someone to talk to, please call the Suicide and Crisis Lifeline: 988 call the [place] National Suicide Prevention Lifeline: 907-439-6832 or TTY: 478-544-6375 TTY 754-737-5244) to talk to a trained counselor call 1-800-273-TALK (toll free, 24 hour  hotline) go to [facility] [address] [place] [phone]) call the [facility] Crisis Line: [phone] call 911  Patient verbalizes understanding of instructions and care plan provided today and agrees to view in MyChart. Active MyChart status and patient understanding of how to access instructions and care plan via MyChart confirmed with patient.     No further follow up required.  Danford Bad, BSW, MSW, LCSW [facility], [facility] Clinical Social Worker II Direct Dial: [phone]  Fax: [phone] Website: [email or web address]

## 2024-01-19 ENCOUNTER — Other Ambulatory Visit: Payer: Self-pay

## 2024-01-19 ENCOUNTER — Emergency Department
Admission: EM | Admit: 2024-01-19 | Discharge: 2024-01-19 | Disposition: A | Discharge status: Home / Self Care | Payer: MEDICAID | Attending: Emergency Medicine | Admitting: Emergency Medicine

## 2024-01-19 DIAGNOSIS — Z5900 Homelessness unspecified: Secondary | ICD-10-CM | POA: Diagnosis not present

## 2024-01-19 DIAGNOSIS — M79605 Pain in left leg: Secondary | ICD-10-CM

## 2024-01-19 DIAGNOSIS — M79652 Pain in left thigh: Secondary | ICD-10-CM | POA: Insufficient documentation

## 2024-01-19 LAB — D-DIMER, QUANTITATIVE: D-Dimer, Quant: <0.27 ug{FEU}/mL (ref 0.00–0.50)

## 2024-01-19 LAB — CBG MONITORING, ED: Glucose-Capillary: 127 mg/dL — ABNORMAL HIGH (ref 70–99)

## 2024-01-19 MED ORDER — NAPROXEN 500 MG PO TABS
500.0000 mg | ORAL_TABLET | Freq: Two times a day (BID) | ORAL | 0 refills | Status: AC
Start: 2024-01-19 — End: 2024-01-26

## 2024-01-19 MED ORDER — KETOROLAC TROMETHAMINE 15 MG/ML IJ SOLN
15.0000 mg | Freq: Once | INTRAMUSCULAR | Status: AC
  Administered 2024-01-19: 15 mg via INTRAMUSCULAR
  Filled 2024-01-19: qty 1

## 2024-01-19 NOTE — ED Notes (Signed)
 See triage note  Presents with pain to left upper thigh  States this has been going on for couple of months  Denies any injury  and is able to ambulate

## 2024-01-19 NOTE — Discharge Instructions (Addendum)
 Your blood test was negative, therefore it is very unlikely that you have a blood clot.  You may take the medication as prescribed to help with your pain.  Please return for any new, worsening, or change in symptoms or other concerns.  It was a pleasure caring for you today.

## 2024-01-19 NOTE — ED Provider Notes (Signed)
 [facility] Provider Note    Event Date/Time   First MD Initiated Contact with Patient 01/19/24 [phone]     (approximate)   History   Leg Pain   HPI  Elijah [name] is a 28 y.o. male who presents today for evaluation of left lateral leg pain.  Patient reports that this has been ongoing for approximately 6 months.  He points to the outer aspect of his thigh to the location of his pain.  He denies any injuries, though reports that he occasionally gets shooting pain from his back to this area on his thigh.  No fevers or chills.  No burning with urination.  No weakness in his leg.  No injuries to his back.  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   GERD (gastroesophageal reflux disease) 03/09/2017   Adjustment disorder with mixed anxiety and depressed mood 03/09/2017   Autism spectrum disorder 03/08/2017   Intellectual disability 11/04/2016   Auditory hallucinations    Learning disability 12/21/2014   ADD (attention deficit disorder) 11/24/2012   Morbid obesity (HCC) 11/24/2012          Physical Exam   Triage Vital Signs: ED Triage Vitals  Encounter Vitals Group     BP 01/19/24 0926 (!) 111/92     Girls Systolic BP Percentile --      Girls Diastolic BP Percentile --      Boys Systolic BP Percentile --      Boys Diastolic BP Percentile --      Pulse Rate 01/19/24 0926 81     Resp 01/19/24 0926 20     Temp 01/19/24 0926 98.7 F (37.1 C)     Temp Source 01/19/24 0926 Oral     SpO2 01/19/24 0926 98 %     Weight 01/19/24 0951 225 lb 15.5 oz (102.5 kg)     Height 01/19/24 0927 5' 5 (1.651 m)     Head Circumference --      Peak Flow --      Pain Score 01/19/24 0926 7     Pain Loc --      Pain Education --      Exclude from Growth Chart --     Most recent vital signs: Vitals:   01/19/24 0926  BP: (!) 111/92  Pulse: 81  Resp: 20  Temp: 98.7 F (37.1 C)  SpO2: 98%     Physical Exam Vitals and nursing note reviewed.  Constitutional:      General: Awake and alert. No acute distress.    Appearance: Normal appearance. The patient is normal weight.  HENT:     Head: Normocephalic and atraumatic.     Mouth: Mucous membranes are moist.  Eyes:     General: PERRL. Normal EOMs        Right eye: No discharge.        Left eye: No discharge.     Conjunctiva/sclera: Conjunctivae normal.  Cardiovascular:     Rate and Rhythm: Normal rate and regular rhythm.     Pulses: Normal pulses.  Pulmonary:     Effort: Pulmonary effort is normal. No respiratory distress.     Breath sounds: Normal breath sounds.  Abdominal:     Abdomen is soft. There is no abdominal tenderness. No rebound or guarding. No distention. Musculoskeletal:        General: No swelling. Normal range of motion.  Cervical back: Normal range of motion and neck supple.  Tenderness palpation to left lateral upper thigh.  No swelling.  No pitting edema.  No erythema.  No wounds noted. Back: No midline tenderness. Strength and sensation 5/5 to bilateral lower extremities. Normal great toe extension against resistance. Normal sensation throughout feet. Normal patellar reflexes.  Mild discomfort with SLR.  Normal pedal pulses bilaterally.  Feet are warm and well-perfused bilaterally. Skin:    General: Skin is warm and dry.     Capillary Refill: Capillary refill takes less than 2 seconds.     Findings: No rash.  Neurological:     Mental Status: The patient is awake and alert.      ED Results / Procedures / Treatments   Labs (all labs ordered are listed, but only abnormal results are displayed) Labs Reviewed  CBG MONITORING, ED - Abnormal; Notable for the following components:      Result Value   Glucose-Capillary 127 (*)    All other components within normal limits  D-DIMER, QUANTITATIVE     EKG     RADIOLOGY     PROCEDURES:  Critical Care performed:    Procedures   MEDICATIONS ORDERED IN ED: Medications  ketorolac  (TORADOL ) 15 MG/ML injection 15 mg (15 mg Intramuscular Given 01/19/24 1019)     IMPRESSION / MDM / ASSESSMENT AND PLAN / ED COURSE  I reviewed the triage vital signs and the nursing notes.   Differential diagnosis includes, but is not limited to, lumbar radiculopathy, muscle strain, DVT.  It is awake and alert, hemodynamically stable and afebrile.  He is nontoxic in appearance.  He is ambulatory with a steady gait.  He has normal-appearing legs bilaterally, no pitting edema, no evidence of infection or swelling.  He does have pain with straight leg raise on that affected side with radiation down to his mid upper thigh consistent with lumbar radiculopathy.  He has normal strength and sensation bilaterally.  D-dimer was obtained and negative, therefore very low suspicion for DVT, especially in the setting of no recent immobilization, no personal or family history of PE/DVT, no pitting edema.  He was treated symptomatically with Toradol  with resolution of his pain.  No constitutional symptoms.  No red flag signs or symptoms of cord compression or cauda equina.  No abdominal pain.  He has normal 2+ pedal pulses bilaterally, feet are warm and well-perfused bilaterally, do not suspect vascular occlusion.  We discussed return precautions and outpatient follow-up.  Patient understands and agrees with plan.  He was discharged in stable condition.   Patient's presentation is most consistent with acute complicated illness / injury requiring diagnostic workup.    FINAL CLINICAL IMPRESSION(S) / ED DIAGNOSES   Final diagnoses:  Left leg pain     Rx / DC Orders   ED Discharge Orders          Ordered    naproxen  (NAPROSYN ) 500 MG tablet  2 times daily with meals        01/19/24 1041             Note:  This document was prepared using Dragon voice recognition software and may include unintentional dictation errors.    Hartlee Amedee E, PA-C 01/19/24 1328    Suzanne Kirsch, MD 01/19/24 1545

## 2024-01-19 NOTE — ED Triage Notes (Addendum)
 Pt to ED via POV from home. Pt reports stopped taking his metformin  x3 months because he made him urinate to much. Pt reports left leg pain x6 months that has worsened in the last few days. Pt reports pain is to entire leg.   Pt reports has not been checking CBG at home

## 2024-02-29 ENCOUNTER — Ambulatory Visit: Payer: MEDICAID | Admitting: Family Medicine

## 2024-02-29 VITALS — BP 116/75 | HR 86 | Temp 98.6°F | Ht 65.0 in | Wt 236.4 lb

## 2024-02-29 DIAGNOSIS — E785 Hyperlipidemia, unspecified: Secondary | ICD-10-CM | POA: Diagnosis not present

## 2024-02-29 DIAGNOSIS — D509 Iron deficiency anemia, unspecified: Secondary | ICD-10-CM

## 2024-02-29 DIAGNOSIS — E119 Type 2 diabetes mellitus without complications: Secondary | ICD-10-CM

## 2024-02-29 DIAGNOSIS — E1169 Type 2 diabetes mellitus with other specified complication: Secondary | ICD-10-CM

## 2024-02-29 NOTE — Progress Notes (Signed)
   Subjective:    Patient ID: Elijah [name], male    DOB: Mar 07, 1996, 28 y.o.   MRN: [id number]  HPI Patient relates that he has not been taking metformin  lately he states it caused too much GI upset He does state he is trying to maintain a healthy diet and stay physically active.  He relates that he is having a hard time eating healthy because of he is very busy with work lives at home He understands importance to get diabetes under control His weight is in the morbid obesity range he needs to watch his diet and stay physically active   Review of Systems     Objective:   Physical Exam General-in no acute distress Eyes-no discharge Lungs-respiratory rate normal, CTA CV-no murmurs,RRR Extremities skin warm dry no edema Neuro grossly normal Behavior normal, alert        Assessment & Plan:   1. Hyperlipidemia associated with type 2 diabetes mellitus (HCC) (Primary) Portion control regular physical activity had lipid profile earlier this year will do the next 1 on his next visit 6 months  2. Diabetes mellitus without complication (HCC) Check A1c along with metabolic 7 Consider possibility of long-acting metformin  but wait to see what A1c shows - Hemoglobin A1c - Basic Metabolic Panel  3. Morbid obesity (HCC) Portion control regular physical activity  4. Iron deficiency anemia, unspecified iron deficiency anemia type Lab work ordered because of history of iron deficiency poor dietary habits better healthy choices was recommended - Iron, TIBC and Ferritin Panel  Follow-up 6 months make decisions on metformin  possibly metformin  XR based on his lab work

## 2024-03-07 ENCOUNTER — Ambulatory Visit: Payer: Self-pay | Admitting: Family Medicine

## 2024-03-07 ENCOUNTER — Encounter: Payer: Self-pay | Admitting: Family Medicine

## 2024-03-07 LAB — BASIC METABOLIC PANEL WITH GFR
BUN/Creatinine Ratio: 14 (ref 9–20)
BUN: 10 mg/dL (ref 6–20)
CO2: 22 mmol/L (ref 20–29)
Calcium: 9.4 mg/dL (ref 8.7–10.2)
Chloride: 103 mmol/L (ref 96–106)
Creatinine, Ser: 0.73 mg/dL — ABNORMAL LOW (ref 0.76–1.27)
Glucose: 93 mg/dL (ref 70–99)
Potassium: 4.5 mmol/L (ref 3.5–5.2)
Sodium: 140 mmol/L (ref 134–144)
eGFR: 128 mL/min/1.73 (ref >59)

## 2024-03-07 LAB — HEMOGLOBIN A1C
Est. average glucose Bld gHb Est-mCnc: 143 mg/dL
Hgb A1c MFr Bld: 6.6 % — ABNORMAL HIGH (ref 4.8–5.6)

## 2024-03-07 LAB — IRON,TIBC AND FERRITIN PANEL
Ferritin: 8 ng/mL — ABNORMAL LOW (ref 30–400)
Iron Saturation: 8 % — CL (ref 15–55)
Iron: 33 ug/dL — ABNORMAL LOW (ref 38–169)
Total Iron Binding Capacity: 426 ug/dL (ref 250–450)
UIBC: 393 ug/dL — ABNORMAL HIGH (ref 111–343)

## 2024-03-08 ENCOUNTER — Other Ambulatory Visit: Payer: Self-pay

## 2024-03-08 DIAGNOSIS — D509 Iron deficiency anemia, unspecified: Secondary | ICD-10-CM

## 2024-03-08 DIAGNOSIS — E119 Type 2 diabetes mellitus without complications: Secondary | ICD-10-CM

## 2024-03-08 MED ORDER — METFORMIN HCL ER 500 MG PO TB24: 500.0000 mg | ORAL_TABLET | Freq: Every day | ORAL | 1 refills | Status: AC

## 2024-03-13 ENCOUNTER — Encounter: Payer: MEDICAID | Admitting: Oncology

## 2024-03-13 ENCOUNTER — Inpatient Hospital Stay: Payer: MEDICAID

## 2024-03-15 ENCOUNTER — Other Ambulatory Visit: Payer: Self-pay

## 2024-03-15 ENCOUNTER — Emergency Department: Admission: EM | Admit: 2024-03-15 | Discharge: 2024-03-15 | Discharge status: Against Medical Advice | Payer: MEDICAID

## 2024-03-15 DIAGNOSIS — D649 Anemia, unspecified: Secondary | ICD-10-CM | POA: Insufficient documentation

## 2024-03-15 DIAGNOSIS — Z5321 Procedure and treatment not carried out due to patient leaving prior to being seen by health care provider: Secondary | ICD-10-CM | POA: Diagnosis not present

## 2024-03-15 LAB — CBC
HCT: 40.1 % (ref 39.0–52.0)
Hemoglobin: 12.7 g/dL — ABNORMAL LOW (ref 13.0–17.0)
MCH: 24.3 pg — ABNORMAL LOW (ref 26.0–34.0)
MCHC: 31.7 g/dL (ref 30.0–36.0)
MCV: 76.8 fL — ABNORMAL LOW (ref 80.0–100.0)
Platelets: 304 K/uL (ref 150–400)
RBC: 5.22 MIL/uL (ref 4.22–5.81)
RDW: 15.1 % (ref 11.5–15.5)
WBC: 7.7 K/uL (ref 4.0–10.5)
nRBC: 0 % (ref 0.0–0.2)

## 2024-03-15 LAB — IRON AND TIBC
Iron: 24 ug/dL — ABNORMAL LOW (ref 45–182)
Saturation Ratios: 5 % — ABNORMAL LOW (ref 17.9–39.5)
TIBC: 526 ug/dL — ABNORMAL HIGH (ref 250–450)
UIBC: 502 ug/dL

## 2024-03-15 LAB — BASIC METABOLIC PANEL WITH GFR
Anion gap: 10 (ref 5–15)
BUN: 15 mg/dL (ref 6–20)
CO2: 23 mmol/L (ref 22–32)
Calcium: 8.8 mg/dL — ABNORMAL LOW (ref 8.9–10.3)
Chloride: 104 mmol/L (ref 98–111)
Creatinine, Ser: 0.77 mg/dL (ref 0.61–1.24)
GFR, Estimated: >60 mL/min (ref >60)
Glucose, Bld: 92 mg/dL (ref 70–99)
Potassium: 3.9 mmol/L (ref 3.5–5.1)
Sodium: 137 mmol/L (ref 135–145)

## 2024-03-15 NOTE — ED Triage Notes (Signed)
 Pt comes with low iron levels according to his pcp. Pt states his mom is worried about that. Pt states his arms have been aching.

## 2024-03-16 ENCOUNTER — Other Ambulatory Visit: Payer: Self-pay

## 2024-03-16 ENCOUNTER — Encounter: Payer: Self-pay | Admitting: *Deleted

## 2024-03-16 DIAGNOSIS — D649 Anemia, unspecified: Secondary | ICD-10-CM | POA: Insufficient documentation

## 2024-03-16 DIAGNOSIS — Z5321 Procedure and treatment not carried out due to patient leaving prior to being seen by health care provider: Secondary | ICD-10-CM | POA: Insufficient documentation

## 2024-03-16 NOTE — ED Triage Notes (Signed)
 Pt ambulatory to triage concerned about his low iron levels. Pt was seen yesterday in ED, LWBS. He was referred to the hematologist by his PCP but will not be seen until middle of the month. He says he just generally does not feel well.

## 2024-03-17 ENCOUNTER — Emergency Department
Admission: EM | Admit: 2024-03-17 | Discharge: 2024-03-17 | Discharge status: Against Medical Advice | Payer: MEDICAID | Attending: Emergency Medicine | Admitting: Emergency Medicine

## 2024-03-18 ENCOUNTER — Other Ambulatory Visit: Payer: Self-pay

## 2024-03-18 ENCOUNTER — Emergency Department
Admission: EM | Admit: 2024-03-18 | Discharge: 2024-03-18 | Disposition: A | Discharge status: Home / Self Care | Payer: MEDICAID | Attending: Emergency Medicine | Admitting: Emergency Medicine

## 2024-03-18 DIAGNOSIS — E119 Type 2 diabetes mellitus without complications: Secondary | ICD-10-CM | POA: Insufficient documentation

## 2024-03-18 DIAGNOSIS — R5383 Other fatigue: Secondary | ICD-10-CM | POA: Diagnosis present

## 2024-03-18 DIAGNOSIS — D649 Anemia, unspecified: Secondary | ICD-10-CM

## 2024-03-18 DIAGNOSIS — D509 Iron deficiency anemia, unspecified: Secondary | ICD-10-CM | POA: Diagnosis not present

## 2024-03-18 LAB — CBC
HCT: 41.7 % (ref 39.0–52.0)
Hemoglobin: 12.9 g/dL — ABNORMAL LOW (ref 13.0–17.0)
MCH: 24.3 pg — ABNORMAL LOW (ref 26.0–34.0)
MCHC: 30.9 g/dL (ref 30.0–36.0)
MCV: 78.5 fL — ABNORMAL LOW (ref 80.0–100.0)
Platelets: 299 K/uL (ref 150–400)
RBC: 5.31 MIL/uL (ref 4.22–5.81)
RDW: 15 % (ref 11.5–15.5)
WBC: 6.5 K/uL (ref 4.0–10.5)
nRBC: 0 % (ref 0.0–0.2)

## 2024-03-18 LAB — BASIC METABOLIC PANEL WITH GFR
Anion gap: 11 (ref 5–15)
BUN: 13 mg/dL (ref 6–20)
CO2: 25 mmol/L (ref 22–32)
Calcium: 9.3 mg/dL (ref 8.9–10.3)
Chloride: 106 mmol/L (ref 98–111)
Creatinine, Ser: 0.84 mg/dL (ref 0.61–1.24)
GFR, Estimated: >60 mL/min (ref >60)
Glucose, Bld: 94 mg/dL (ref 70–99)
Potassium: 3.7 mmol/L (ref 3.5–5.1)
Sodium: 142 mmol/L (ref 135–145)

## 2024-03-18 LAB — IRON AND TIBC
Iron: 38 ug/dL — ABNORMAL LOW (ref 45–182)
Saturation Ratios: 7 % — ABNORMAL LOW (ref 17.9–39.5)
TIBC: 532 ug/dL — ABNORMAL HIGH (ref 250–450)
UIBC: 494 ug/dL

## 2024-03-18 LAB — CK: Total CK: 106 U/L (ref 49–397)

## 2024-03-18 MED ORDER — FERROUS SULFATE 325 (65 FE) MG PO TBEC
325.0000 mg | DELAYED_RELEASE_TABLET | Freq: Two times a day (BID) | ORAL | 1 refills | Status: DC
Start: 2024-03-18 — End: 2024-03-18

## 2024-03-18 MED ORDER — FERROUS SULFATE 325 (65 FE) MG PO TBEC: 325.0000 mg | DELAYED_RELEASE_TABLET | Freq: Every day | ORAL | 1 refills | Status: AC

## 2024-03-18 NOTE — ED Triage Notes (Addendum)
 Pt comes with needing his iron checked. Pt was seen here on the 22nd but left before the doctor saw him. Pt states his arms and legs hurt.

## 2024-03-18 NOTE — Discharge Instructions (Addendum)
 You were seen in the emergency department for not feeling well.  Your hemoglobin level was mildly low.  You do have findings of low iron.  You are given a prescription for iron tablets, take this every day.  You were given a referral for hematology.  Your electrolytes were normal.  Your kidney function was at your normal.  Stay hydrated and drink plenty of fluids.  You can alternate Motrin  and Tylenol  for your myalgias.  Return to the emergency department if you have any worsening symptoms.  Pain control:  Ibuprofen  (motrin /aleve /advil ) - You can take 3 tablets (600 mg) every 6 hours as needed for pain/fever.  Acetaminophen  (tylenol ) - You can take 2 extra strength tablets (1000 mg) every 6 hours as needed for pain/fever.  You can alternate these medications or take them together.  Make sure you eat food/drink water when taking these medications.

## 2024-03-18 NOTE — ED Provider Notes (Signed)
 [facility] Provider Note    Event Date/Time   First MD Initiated Contact with Patient 03/18/24 1142     (approximate)   History   Abnormal Lab   HPI  Elijah [name] is a 28 y.o. male past medical history significant for diabetes, obesity, hyperlipidemia, presents to the emergency department with feeling sore and tired.  States that he recently was evaluated at his primary care physician and told that he had low iron levels.  Was given a referral for hematology but has not yet been seen and states that he has a hematology referral in [place] in the next 1 month.  Over the past week has been feeling tired with myalgias.  Denies any significant fever, chills, cough, shortness of breath or chest pain.  Denies any abdominal pain.  Denies any dysuria, urinary urgency or frequency.  Urinating a normal amount and does not have any dark urine.  Has not been started on any iron supplements.  Denies any falls or trauma.  States that his mother was wanting him to have a referral to the local hematologist and she is followed by Dr. Melanee.  Denies any blood in his stool or melanotic stools.       Physical Exam   Triage Vital Signs: ED Triage Vitals  Encounter Vitals Group     BP 03/18/24 1125 (!) 145/84     Girls Systolic BP Percentile --      Girls Diastolic BP Percentile --      Boys Systolic BP Percentile --      Boys Diastolic BP Percentile --      Pulse Rate 03/18/24 1125 91     Resp 03/18/24 1125 18     Temp 03/18/24 1125 98 F (36.7 C)     Temp src --      SpO2 03/18/24 1125 100 %     Weight 03/18/24 1123 236 lb 6.4 oz (107.2 kg)     Height 03/18/24 1123 5' 5 (1.651 m)     Head Circumference --      Peak Flow --      Pain Score 03/18/24 1123 9     Pain Loc --      Pain Education --      Exclude from Growth Chart --     Most recent vital signs: Vitals:   03/18/24 1125  BP: (!) 145/84  Pulse: 91  Resp: 18  Temp: 98 F (36.7 C)  SpO2: 100%     Physical Exam Constitutional:      Appearance: He is well-developed.  HENT:     Head: Atraumatic.  Eyes:     Extraocular Movements: Extraocular movements intact.     Conjunctiva/sclera: Conjunctivae normal.     Pupils: Pupils are equal, round, and reactive to light.  Cardiovascular:     Rate and Rhythm: Regular rhythm.     Pulses: Normal pulses.  Pulmonary:     Effort: No respiratory distress.  Abdominal:     Tenderness: There is no abdominal tenderness.  Musculoskeletal:        General: Normal range of motion.     Cervical back: Normal range of motion.     Right lower leg: No edema.     Left lower leg: No edema.  Skin:    General: Skin is warm.     Capillary Refill: Capillary refill takes less than 2 seconds.  Neurological:     General: No focal deficit present.  Mental Status: He is alert. Mental status is at baseline.  Psychiatric:        Mood and Affect: Mood normal.      IMPRESSION / MDM / ASSESSMENT AND PLAN / ED COURSE  I reviewed the triage vital signs and the nursing notes.  Differential diagnosis including anemia, iron deficiency, electrolyte abnormality, dehydration, viral illness, rhabdomyolysis  Plan for lab work and will reevaluate  Labs (all labs ordered are listed, but only abnormal results are displayed) Labs interpreted as -    Labs Reviewed  CBC - Abnormal; Notable for the following components:      Result Value   Hemoglobin 12.9 (*)    MCV 78.5 (*)    MCH 24.3 (*)    All other components within normal limits  IRON AND TIBC - Abnormal; Notable for the following components:   Iron 38 (*)    TIBC 532 (*)    Saturation Ratios 7 (*)    All other components within normal limits  BASIC METABOLIC PANEL WITH GFR  CK   Patient does have anemia with a hemoglobin of 12.9 from a baseline of 12.7.  Does appear microcytic.  No leukocytosis.  Creatinine appears to be at his baseline.  Normal BUN.  No significant electrolyte abnormality.  Iron  level of 38 which is actually increased when compared to prior at 24.  Patient denies any dark urine or tea colored urine, and normal urine output and normal kidney function, have a low suspicion for rhabdomyolysis.  Possible viral illness.  No shortness of breath, 100% on room air no cough, do not feel that he needs a chest x-ray to further evaluate for pneumonia.  Abdomen is nontender to palpation.  Afebrile in the emergency department.  Given a referral for hematology.  Will start on ferrous sulfate .  Discussed close follow-up with primary care physician, hydration, NSAIDs and Tylenol .  Discussed return to the emergency department for any worsening symptoms or new concerning symptoms.  Patient expressed understanding.  No questions at time of discharge.        PROCEDURES:  Critical Care performed: No  Procedures  Patient's presentation is most consistent with acute presentation with potential threat to life or bodily function.   MEDICATIONS ORDERED IN ED: Medications - No data to display  FINAL CLINICAL IMPRESSION(S) / ED DIAGNOSES   Final diagnoses:  Anemia, unspecified type  Iron deficiency anemia, unspecified iron deficiency anemia type     Rx / DC Orders   ED Discharge Orders          Ordered    ferrous sulfate  325 (65 FE) MG EC tablet  2 times daily,   Status:  Discontinued        03/18/24 1154    ferrous sulfate  325 (65 FE) MG EC tablet  Daily with breakfast        03/18/24 1155    Ambulatory referral to Hematology / Oncology       Comments: Your emergency department provider has referred you to see a hematology/oncology specialist. These are physicians who specialize in blood disorders and cancers, or findings concerning for cancer. You will receive a phone call from the [facility] Office to set up your appointment within 2 business days: [employer] operate Mon - Fri, 8:00 a.m. to 5:00 p.m.; closed for federally recognized holidays. Please be sure your phone  is not set to block numbers during this time.   03/18/24 1215  Note:  This document was prepared using Dragon voice recognition software and may include unintentional dictation errors.   Suzanne Kirsch, MD 03/18/24 1224

## 2024-03-18 NOTE — ED Notes (Signed)
 See triage note Presents with complaints of just not feeling well States his joints hurt

## 2024-03-26 ENCOUNTER — Encounter: Payer: Self-pay | Admitting: Oncology

## 2024-03-26 ENCOUNTER — Inpatient Hospital Stay: Payer: MEDICAID | Attending: Oncology | Admitting: Oncology

## 2024-03-26 ENCOUNTER — Other Ambulatory Visit: Payer: MEDICAID

## 2024-03-26 VITALS — BP 100/54 | HR 96 | Temp 99.3°F | Resp 18 | Ht 65.0 in | Wt 237.5 lb

## 2024-03-26 DIAGNOSIS — Z87891 Personal history of nicotine dependence: Secondary | ICD-10-CM | POA: Diagnosis not present

## 2024-03-26 DIAGNOSIS — D509 Iron deficiency anemia, unspecified: Secondary | ICD-10-CM | POA: Insufficient documentation

## 2024-03-26 DIAGNOSIS — E119 Type 2 diabetes mellitus without complications: Secondary | ICD-10-CM | POA: Diagnosis not present

## 2024-03-26 NOTE — Progress Notes (Signed)
 Patient is a new patient; Iron deficiency anemia. Mom is patient of Dr. Darold.

## 2024-03-26 NOTE — Progress Notes (Signed)
 Hematology/Oncology Consult note [facility] [phone] [phone]  Patient Care Team: Alphonsa Glendia LABOR, MD as PCP - General (Family Medicine) Shaaron Lamar HERO, MD as Consulting Physician (Gastroenterology)   Name of the patient: Elijah [name]  [id number]  October 12, 1995    Reason for referral-iron deficiency anemia   Referring physician-Dr. Clotilda Punter  Date of visit: 03/26/24   History of presenting illness- Patient is a 28 year old male with a past medical history significant for GERD, ADHD and diabetes.  He has been referred for iron deficiency anemia.  CBC from 03/18/2024 showed H&H of 12.9/41.7 with an MCV of 78.5.  White count and platelets are normal.  Iron studies showed elevated TIBC of 532 with an iron saturation of 7%.  Ferritin levels were low at 8.  Patient had last upper endoscopy by Dr. Shaaron in 2019 which showed erosive reflux esophagitis and diaphragmatic hernia.  ECOG PS- 0  Pain scale- 0   Review of systems- Review of Systems  Constitutional:  Positive for malaise/fatigue. Negative for chills, fever and weight loss.  HENT:  Negative for congestion, ear discharge and nosebleeds.   Eyes:  Negative for blurred vision.  Respiratory:  Negative for cough, hemoptysis, sputum production, shortness of breath and wheezing.   Cardiovascular:  Negative for chest pain, palpitations, orthopnea and claudication.  Gastrointestinal:  Negative for abdominal pain, blood in stool, constipation, diarrhea, heartburn, melena, nausea and vomiting.  Genitourinary:  Negative for dysuria, flank pain, frequency, hematuria and urgency.  Musculoskeletal:  Negative for back pain, joint pain and myalgias.  Skin:  Negative for rash.  Neurological:  Negative for dizziness, tingling, focal weakness, seizures, weakness and headaches.  Endo/Heme/Allergies:  Does not bruise/bleed easily.  Psychiatric/Behavioral:  Negative for depression and suicidal ideas.  The patient does not have insomnia.     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   GERD (gastroesophageal reflux disease) 03/09/2017   Adjustment disorder with mixed anxiety and depressed mood 03/09/2017   Autism spectrum disorder 03/08/2017   Intellectual disability 11/04/2016   Auditory hallucinations    Learning disability 12/21/2014   ADD (attention deficit disorder) 11/24/2012   Morbid obesity (HCC) 11/24/2012     Past Medical History:  Diagnosis Date   Acid reflux    ADD (attention deficit disorder)    ADHD (attention deficit hyperactivity disorder)    Anxiety    Autism    Diabetes mellitus without complication (HCC)    Learning disability    Mild mental slowing      Past Surgical History:  Procedure Laterality Date   ESOPHAGOGASTRODUODENOSCOPY (EGD) WITH PROPOFOL  N/A 11/30/2017   Procedure: ESOPHAGOGASTRODUODENOSCOPY (EGD) WITH PROPOFOL ;  Surgeon: Shaaron Lamar HERO, MD;  Location: [facility];  Service: Endoscopy;  Laterality: N/A;  1:00pm   MALONEY DILATION N/A 11/30/2017   Procedure: AGAPITO DILATION;  Surgeon: Shaaron Lamar HERO, MD;  Location: [facility];  Service: Endoscopy;  Laterality: N/A;    Social History   Socioeconomic History   Marital status: Single    Spouse name: Not on file   Number of children: 0   Years of education: 12   Highest education level: 12th grade  Occupational History   Not on file  Tobacco Use   Smoking status: Never    Passive exposure: Never   Smokeless tobacco: Never  Vaping Use   Vaping status: Former  Substance and Sexual  Activity   Alcohol use: Not Currently    Comment: occassional   Drug use: No   Sexual activity: Never    Birth control/protection: None  Other Topics Concern   Not on file  Social History Narrative   Not on file   Social Drivers of Health   Financial Resource Strain: Low Risk  (07/31/2023)   Overall  Financial Resource Strain (CARDIA)    Difficulty of Paying Living Expenses: Not hard at all  Recent Concern: Financial Resource Strain - High Risk (05/03/2023)   Overall Financial Resource Strain (CARDIA)    Difficulty of Paying Living Expenses: Very hard  Food Insecurity: No Food Insecurity (03/26/2024)   Hunger Vital Sign    Worried About Running Out of Food in the Last Year: Never true    Ran Out of Food in the Last Year: Never true  Transportation Needs: No Transportation Needs (03/26/2024)   PRAPARE - Administrator, Civil Service (Medical): No    Lack of Transportation (Non-Medical): No  Physical Activity: Insufficiently Active (07/31/2023)   Exercise Vital Sign    Days of Exercise per Week: 5 days    Minutes of Exercise per Session: 20 min  Stress: No Stress Concern Present (07/31/2023)   [employer] of Occupational Health - Occupational Stress Questionnaire    Feeling of Stress : Only a little  Recent Concern: Stress - Stress Concern Present (05/03/2023)   [employer] of Occupational Health - Occupational Stress Questionnaire    Feeling of Stress : Very much  Social Connections: Moderately Integrated (07/31/2023)   Social Connection and Isolation Panel    Frequency of Communication with Friends and Family: More than three times a week    Frequency of Social Gatherings with Friends and Family: More than three times a week    Attends Religious Services: More than 4 times per year    Active Member of [employer] or Organizations: Yes    Attends Banker Meetings: More than 4 times per year    Marital Status: Never married  Intimate Partner Violence: Not At Risk (03/26/2024)   Humiliation, Afraid, Rape, and Kick questionnaire    Fear of Current or Ex-Partner: No    Emotionally Abused: No    Physically Abused: No    Sexually Abused: No     Family History  Problem Relation Age of Onset   Obesity Mother        gastric sleeve   Healthy Father    Colon cancer  Neg Hx      Current Outpatient Medications:    albuterol  (VENTOLIN  HFA) 108 (90 Base) MCG/ACT inhaler, Inhale 2 puffs into the lungs every 4 (four) hours as needed for wheezing or shortness of breath., Disp: 8 g, Rfl: 0   metFORMIN  (GLUCOPHAGE -XR) 500 MG 24 hr tablet, Take 1 tablet (500 mg total) by mouth daily with breakfast., Disp: 30 tablet, Rfl: 1   ferrous sulfate  325 (65 FE) MG EC tablet, Take 1 tablet (325 mg total) by mouth daily with breakfast. (Patient not taking: Reported on 03/26/2024), Disp: 30 tablet, Rfl: 1   Physical exam:  Vitals:   03/26/24 1450  BP: (!) 100/54  Pulse: 96  Resp: 18  Temp: 99.3 F (37.4 C)  TempSrc: Tympanic  SpO2: 98%  Weight: 237 lb 8 oz (107.7 kg)  Height: 5' 5 (1.651 m)   Physical Exam Cardiovascular:     Rate and Rhythm: Normal rate and regular rhythm.     Heart  sounds: Normal heart sounds.  Pulmonary:     Effort: Pulmonary effort is normal.     Breath sounds: Normal breath sounds.  Abdominal:     General: Bowel sounds are normal.     Palpations: Abdomen is soft.  Skin:    General: Skin is warm and dry.  Neurological:     Mental Status: He is alert and oriented to person, place, and time.           Latest Ref Rng & Units 03/18/2024   11:26 AM  CMP  Glucose 70 - 99 mg/dL 94   BUN 6 - 20 mg/dL 13   Creatinine 9.38 - 1.24 mg/dL 9.15   Sodium 864 - 854 mmol/L 142   Potassium 3.5 - 5.1 mmol/L 3.7   Chloride 98 - 111 mmol/L 106   CO2 22 - 32 mmol/L 25   Calcium 8.9 - 10.3 mg/dL 9.3       Latest Ref Rng & Units 03/18/2024   11:26 AM  CBC  WBC 4.0 - 10.5 K/uL 6.5   Hemoglobin 13.0 - 17.0 g/dL 87.0   Hematocrit 60.9 - 52.0 % 41.7   Platelets 150 - 400 K/uL 299      Assessment and plan- Patient is a 28 y.o. male for routine follow-up of iron deficiency anemia  Mia.  Patient baseline hemoglobin runs around 14.  It has currently drifted down to 12.9 which is at the lower limit of normal.  He had iron studies checked on  03/18/2024 which showed iron saturation of 7% and elevated TIBC of 532.  Ferritin levels in August 2025 were low at 8.  He has taken oral iron in the past but has not been able to tolerate it and it has not helped him.  We are therefore proceeding with IV iron at this time.  Discussed risks and methods of IV iron including all but not limited to possible risk of infusion and anaphylactic reactions.  Patient understands and agrees to proceed as planned.  CBC ferritin and iron studies in 2 and 4 months and I will see him back in 4 months.  Patient previously underwent upper endoscopy by Dr. Lamar Hollingshead at [facility].  Given that he has recurrent iron deficiency he will need to see GI again.  We will make the referral   Thank you for this kind referral and the opportunity to participate in the care of this patient   Visit Diagnosis 1. Iron deficiency anemia, unspecified iron deficiency anemia type     Dr. Annah Skene, MD, MPH [facility] [phone] 03/26/2024

## 2024-03-31 ENCOUNTER — Encounter: Payer: Self-pay | Admitting: Oncology

## 2024-03-31 DIAGNOSIS — D509 Iron deficiency anemia, unspecified: Secondary | ICD-10-CM | POA: Insufficient documentation

## 2024-04-01 ENCOUNTER — Telehealth: Payer: Self-pay

## 2024-04-01 DIAGNOSIS — D509 Iron deficiency anemia, unspecified: Secondary | ICD-10-CM

## 2024-04-01 NOTE — Addendum Note (Signed)
 Addended by: Dasani Crear on: 04/01/2024 10:39 AM   Modules accepted: Orders

## 2024-04-01 NOTE — Telephone Encounter (Signed)
 Voicemail received 04/01/24 at 8:56AM returning nurse call; best contact number [phone].  Outbound call; spoke to patient who indicated he would like to be referred to [facility].  Placing referral now.

## 2024-04-01 NOTE — Telephone Encounter (Signed)
 Per OV note dated 03/26/24 Patient previously underwent upper endoscopy by Dr. Lamar Hollingshead at [facility]. Given that he has recurrent iron deficiency he will need to see GI again. We will make the referral.  Dr. Melanee would like to know can you ask him or his mom who he wants to see? [facility]? not sure if dr debbie is still working in [employer].

## 2024-04-02 ENCOUNTER — Inpatient Hospital Stay: Payer: MEDICAID

## 2024-04-02 VITALS — BP 120/67 | HR 82 | Temp 98.4°F | Resp 16

## 2024-04-02 DIAGNOSIS — D509 Iron deficiency anemia, unspecified: Secondary | ICD-10-CM | POA: Diagnosis not present

## 2024-04-02 MED ORDER — SODIUM CHLORIDE 0.9 % IV SOLN
INTRAVENOUS | Status: DC
  Filled 2024-04-02: qty 250

## 2024-04-02 MED ORDER — SODIUM CHLORIDE 0.9 % IV SOLN
1000.0000 mg | Freq: Once | INTRAVENOUS | Status: AC
  Administered 2024-04-02: 1000 mg via INTRAVENOUS
  Filled 2024-04-02: qty 1000

## 2024-04-02 NOTE — Patient Instructions (Signed)

## 2024-05-27 ENCOUNTER — Inpatient Hospital Stay: Payer: MEDICAID | Attending: Oncology

## 2024-05-27 DIAGNOSIS — D509 Iron deficiency anemia, unspecified: Secondary | ICD-10-CM | POA: Diagnosis present

## 2024-05-27 LAB — CBC (CANCER CENTER ONLY)
HCT: 45.7 % (ref 39.0–52.0)
Hemoglobin: 14.9 g/dL (ref 13.0–17.0)
MCH: 26.5 pg (ref 26.0–34.0)
MCHC: 32.6 g/dL (ref 30.0–36.0)
MCV: 81.3 fL (ref 80.0–100.0)
Platelet Count: 262 K/uL (ref 150–400)
RBC: 5.62 MIL/uL (ref 4.22–5.81)
RDW: 16.5 % — ABNORMAL HIGH (ref 11.5–15.5)
WBC Count: 7 K/uL (ref 4.0–10.5)
nRBC: 0 % (ref 0.0–0.2)

## 2024-05-27 LAB — IRON AND TIBC
Iron: 81 ug/dL (ref 45–182)
Saturation Ratios: 21 % (ref 17.9–39.5)
TIBC: 381 ug/dL (ref 250–450)
UIBC: 300 ug/dL

## 2024-05-27 LAB — FERRITIN: Ferritin: 66 ng/mL (ref 24–336)

## 2024-07-23 ENCOUNTER — Encounter: Payer: Self-pay | Admitting: Oncology

## 2024-07-23 ENCOUNTER — Inpatient Hospital Stay: Payer: MEDICAID | Admitting: Oncology

## 2024-07-23 ENCOUNTER — Other Ambulatory Visit: Payer: Self-pay

## 2024-07-23 ENCOUNTER — Inpatient Hospital Stay: Payer: MEDICAID | Attending: Oncology

## 2024-07-23 VITALS — BP 124/72 | HR 101 | Temp 97.5°F | Resp 20 | Ht 65.0 in | Wt 241.3 lb

## 2024-07-23 DIAGNOSIS — Z87891 Personal history of nicotine dependence: Secondary | ICD-10-CM | POA: Insufficient documentation

## 2024-07-23 DIAGNOSIS — D509 Iron deficiency anemia, unspecified: Secondary | ICD-10-CM | POA: Diagnosis not present

## 2024-07-23 LAB — CBC (CANCER CENTER ONLY)
HCT: 48 % (ref 39.0–52.0)
Hemoglobin: 15.8 g/dL (ref 13.0–17.0)
MCH: 28 pg (ref 26.0–34.0)
MCHC: 32.9 g/dL (ref 30.0–36.0)
MCV: 85.1 fL (ref 80.0–100.0)
Platelet Count: 247 K/uL (ref 150–400)
RBC: 5.64 MIL/uL (ref 4.22–5.81)
RDW: 14 % (ref 11.5–15.5)
WBC Count: 8.9 K/uL (ref 4.0–10.5)
nRBC: 0 % (ref 0.0–0.2)

## 2024-07-23 LAB — IRON AND TIBC
Iron: 61 ug/dL (ref 45–182)
Saturation Ratios: 14 % — ABNORMAL LOW (ref 17.9–39.5)
TIBC: 423 ug/dL (ref 250–450)
UIBC: 362 ug/dL

## 2024-07-23 LAB — FERRITIN: Ferritin: 48 ng/mL (ref 24–336)

## 2024-07-23 NOTE — Progress Notes (Signed)
 "    Hematology/Oncology Consult note [facility]  [phone] [phone]  Patient Care Team: Alphonsa Glendia LABOR, MD as PCP - General (Family Medicine) Shaaron Lamar HERO, MD as Consulting Physician (Gastroenterology) Melanee Annah BROCKS, MD as Consulting Physician (Oncology)   Name of the patient: Elijah [name]  [id number]  Feb 19, 1996   Date of visit: 07/23/2024  Diagnosis-iron deficiency anemia  Chief complaint/ Reason for visit-routine follow-up of iron deficiency anemia  Heme/Onc history: Patient is a 28 year old male with a past medical history significant for GERD, ADHD and diabetes. He has been referred for iron deficiency anemia. CBC from 03/18/2024 showed H&H of 12.9/41.7 with an MCV of 78.5. White count and platelets are normal. Iron studies showed elevated TIBC of 532 with an iron saturation of 7%. Ferritin levels were low at 8. Patient had last upper endoscopy by Dr. Shaaron in 2019 which showed erosive reflux esophagitis and diaphragmatic hernia.   Interval history- Elijah [name] is a 28 year old male with iron deficiency anemia who presents for follow-up after intravenous iron infusion.  Approximately four months ago, his hemoglobin was 12.9 g/dL, decreased from his baseline of 14 g/dL, and his iron numbers were low.  He received intravenous iron infusion at this clinic. Following treatment, his hemoglobin has increased to 15.8 g/dL, which is above his baseline. He currently denies symptoms and reports feeling well.  He has not yet undergone upper endoscopy to evaluate the etiology of his iron deficiency anemia. He has a gastroenterology appointment scheduled in February  Currently patient reports heaviness over the left side of his face and some ongoing congestion symptoms.  Denies any fever.  He has been trying over-the-counter Advil      ECOG PS- 1 Pain scale- 3  Review of systems- Review of Systems  Constitutional:  Negative for  chills, fever, malaise/fatigue and weight loss.  HENT:  Positive for congestion and sinus pain. Negative for ear discharge and nosebleeds.   Eyes:  Negative for blurred vision.  Respiratory:  Negative for cough, hemoptysis, sputum production, shortness of breath and wheezing.   Cardiovascular:  Negative for chest pain, palpitations, orthopnea and claudication.  Gastrointestinal:  Negative for abdominal pain, blood in stool, constipation, diarrhea, heartburn, melena, nausea and vomiting.  Genitourinary:  Negative for dysuria, flank pain, frequency, hematuria and urgency.  Musculoskeletal:  Negative for back pain, joint pain and myalgias.  Skin:  Negative for rash.  Neurological:  Negative for dizziness, tingling, focal weakness, seizures, weakness and headaches.  Endo/Heme/Allergies:  Does not bruise/bleed easily.  Psychiatric/Behavioral:  Negative for depression and suicidal ideas. The patient does not have insomnia.       Allergies[1]   Past Medical History:  Diagnosis Date   Acid reflux    ADD (attention deficit disorder)    ADHD (attention deficit hyperactivity disorder)    Anxiety    Autism    Diabetes mellitus without complication (HCC)    Learning disability    Mild mental slowing      Past Surgical History:  Procedure Laterality Date   ESOPHAGOGASTRODUODENOSCOPY (EGD) WITH PROPOFOL  N/A 11/30/2017   Procedure: ESOPHAGOGASTRODUODENOSCOPY (EGD) WITH PROPOFOL ;  Surgeon: Shaaron Lamar HERO, MD;  Location: [facility];  Service: Endoscopy;  Laterality: N/A;  1:00pm   MALONEY DILATION N/A 11/30/2017   Procedure: AGAPITO DILATION;  Surgeon: Shaaron Lamar HERO, MD;  Location: [facility];  Service: Endoscopy;  Laterality: N/A;    Social History   Socioeconomic History   Marital status: Single  Spouse name: Not on file   Number of children: 0   Years of education: 28   Highest education level: 12th grade  Occupational History   Not on file  Tobacco Use   Smoking status:  Never    Passive exposure: Never   Smokeless tobacco: Never  Vaping Use   Vaping status: Former  Substance and Sexual Activity   Alcohol use: Not Currently    Comment: occassional   Drug use: No   Sexual activity: Never    Birth control/protection: None  Other Topics Concern   Not on file  Social History Narrative   Not on file   Social Drivers of Health   Tobacco Use: Low Risk (07/23/2024)   Patient History    Smoking Tobacco Use: Never    Smokeless Tobacco Use: Never    Passive Exposure: Never  Financial Resource Strain: Low Risk (07/31/2023)   Overall Financial Resource Strain (CARDIA)    Difficulty of Paying Living Expenses: Not hard at all  Recent Concern: Financial Resource Strain - High Risk (05/03/2023)   Overall Financial Resource Strain (CARDIA)    Difficulty of Paying Living Expenses: Very hard  Food Insecurity: No Food Insecurity (03/26/2024)   Epic    Worried About Radiation Protection Practitioner of Food in the Last Year: Never true    Ran Out of Food in the Last Year: Never true  Transportation Needs: No Transportation Needs (03/26/2024)   Epic    Lack of Transportation (Medical): No    Lack of Transportation (Non-Medical): No  Physical Activity: Insufficiently Active (07/31/2023)   Exercise Vital Sign    Days of Exercise per Week: 5 days    Minutes of Exercise per Session: 20 min  Stress: No Stress Concern Present (07/31/2023)   [employer] of Occupational [employer] - Occupational Stress Questionnaire    Feeling of Stress : Only a little  Recent Concern: Stress - Stress Concern Present (05/03/2023)   [employer] of Occupational [employer] - Occupational Stress Questionnaire    Feeling of Stress : Very much  Social Connections: Moderately Integrated (07/31/2023)   Social Connection and Isolation Panel    Frequency of Communication with Friends and Family: More than three times a week    Frequency of Social Gatherings with Friends and Family: More than three times a week    Attends  Religious Services: More than 4 times per year    Active Member of Clubs or Organizations: Yes    Attends Banker Meetings: More than 4 times per year    Marital Status: Never married  Intimate Partner Violence: Not At Risk (03/26/2024)   Epic    Fear of Current or Ex-Partner: No    Emotionally Abused: No    Physically Abused: No    Sexually Abused: No  Depression (PHQ2-9): Low Risk (07/23/2024)   Depression (PHQ2-9)    PHQ-2 Score: 0  Alcohol Screen: Low Risk (07/31/2023)   Alcohol Screen    Last Alcohol Screening Score (AUDIT): 1  Housing: Low Risk (03/26/2024)   Epic    Unable to Pay for Housing in the Last Year: No    Number of Times Moved in the Last Year: 0    Homeless in the Last Year: No  Utilities: Not At Risk (03/26/2024)   Epic    Threatened with loss of utilities: No  Health Literacy: Adequate Health Literacy (07/31/2023)   B1300 Health Literacy    Frequency of need for help with medical instructions: Never  Family History  Problem Relation Age of Onset   Obesity Mother        gastric sleeve   Healthy Father    Colon cancer Neg Hx     Current Medications[2]  Physical exam:  Vitals:   07/23/24 1319  BP: 124/72  Pulse: (!) 101  Resp: 20  Temp: (!) 97.5 F (36.4 C)  TempSrc: Tympanic  SpO2: 96%  Weight: 241 lb 4.8 oz (109.5 kg)  Height: 5' 5 (1.651 m)   Physical Exam HENT:     Mouth/Throat:     Pharynx: Oropharynx is clear.  Cardiovascular:     Rate and Rhythm: Normal rate and regular rhythm.     Heart sounds: Normal heart sounds.  Pulmonary:     Effort: Pulmonary effort is normal.     Breath sounds: Normal breath sounds.  Skin:    General: Skin is warm and dry.  Neurological:     Mental Status: He is alert and oriented to person, place, and time.      I have personally reviewed labs listed below:    Latest Ref Rng & Units 03/18/2024   11:26 AM  CMP  Glucose 70 - 99 mg/dL 94   BUN 6 - 20 mg/dL 13   Creatinine 9.38 - 1.24 mg/dL  9.15   Sodium 864 - 854 mmol/L 142   Potassium 3.5 - 5.1 mmol/L 3.7   Chloride 98 - 111 mmol/L 106   CO2 22 - 32 mmol/L 25   Calcium 8.9 - 10.3 mg/dL 9.3       Latest Ref Rng & Units 07/23/2024    1:03 PM  CBC  WBC 4.0 - 10.5 K/uL 8.9   Hemoglobin 13.0 - 17.0 g/dL 84.1   Hematocrit 60.9 - 52.0 % 48.0   Platelets 150 - 400 K/uL 247       Assessment and plan- Patient is a 28 y.o. male here for routine follow-up of iron deficiency anemia  Assessment and Plan    Iron deficiency anemia Hemoglobin and iron indices normalized post-IV iron infusion. Etiology under evaluation with gastroenterology consultation scheduled. - No further iron supplementation. - Monitor iron studies in four months. - Follow-up visit in eight months. - Gastroenterology appointment in February for etiology evaluation.     Sinus congestion: Continue conservative measures and if symptoms do not improve then he will need to get in touch with his primary care doctor    Visit Diagnosis 1. Iron deficiency anemia, unspecified iron deficiency anemia type      Dr. Annah Skene, MD, MPH [facility] [phone] 07/23/2024 1:38 PM                   [1] No Known Allergies [2]  Current Outpatient Medications:    albuterol  (VENTOLIN  HFA) 108 (90 Base) MCG/ACT inhaler, Inhale 2 puffs into the lungs every 4 (four) hours as needed for wheezing or shortness of breath., Disp: 8 g, Rfl: 0   metFORMIN  (GLUCOPHAGE -XR) 500 MG 24 hr tablet, Take 1 tablet (500 mg total) by mouth daily with breakfast., Disp: 30 tablet, Rfl: 1   ferrous sulfate  325 (65 FE) MG EC tablet, Take 1 tablet (325 mg total) by mouth daily with breakfast. (Patient not taking: Reported on 03/26/2024), Disp: 30 tablet, Rfl: 1  "

## 2024-07-23 NOTE — Progress Notes (Signed)
 Patient states he's having some left facial pain that comes & goes. He would like insight regarding this issue.

## 2024-07-24 ENCOUNTER — Ambulatory Visit: Payer: MEDICAID | Admitting: Oncology

## 2024-07-24 ENCOUNTER — Other Ambulatory Visit: Payer: MEDICAID

## 2024-08-30 ENCOUNTER — Ambulatory Visit: Payer: MEDICAID | Admitting: Family Medicine

## 2024-11-19 ENCOUNTER — Inpatient Hospital Stay: Payer: MEDICAID

## 2024-12-09 ENCOUNTER — Ambulatory Visit: Payer: MEDICAID | Admitting: Family Medicine

## 2025-03-18 ENCOUNTER — Inpatient Hospital Stay: Payer: MEDICAID

## 2025-03-18 ENCOUNTER — Inpatient Hospital Stay: Payer: MEDICAID | Admitting: Oncology
